# Patient Record
Sex: Female | Born: 1965
Health system: Southern US, Community
[De-identification: ages and names within clinical notes are randomized; demographics above are authoritative.]

## PROBLEM LIST (undated history)

## (undated) DIAGNOSIS — I10 Essential (primary) hypertension: Secondary | ICD-10-CM

## (undated) DIAGNOSIS — N189 Chronic kidney disease, unspecified: Secondary | ICD-10-CM

## (undated) DIAGNOSIS — M1990 Unspecified osteoarthritis, unspecified site: Secondary | ICD-10-CM

## (undated) DIAGNOSIS — M329 Systemic lupus erythematosus, unspecified: Secondary | ICD-10-CM

## (undated) DIAGNOSIS — M7989 Other specified soft tissue disorders: Secondary | ICD-10-CM

## (undated) DIAGNOSIS — IMO0002 Reserved for concepts with insufficient information to code with codable children: Secondary | ICD-10-CM

---

## 1999-10-10 ENCOUNTER — Emergency Department (HOSPITAL_COMMUNITY): Admission: EM | Admit: 1999-10-10 | Discharge: 1999-10-10 | Payer: Self-pay | Admitting: Emergency Medicine

## 2000-07-02 ENCOUNTER — Other Ambulatory Visit: Admission: RE | Admit: 2000-07-02 | Discharge: 2000-07-02 | Payer: Self-pay

## 2000-07-02 ENCOUNTER — Other Ambulatory Visit: Admission: RE | Admit: 2000-07-02 | Discharge: 2000-07-02 | Payer: Self-pay | Admitting: Obstetrics and Gynecology

## 2013-10-15 ENCOUNTER — Encounter: Payer: Self-pay | Admitting: Internal Medicine

## 2013-10-15 ENCOUNTER — Ambulatory Visit (INDEPENDENT_AMBULATORY_CARE_PROVIDER_SITE_OTHER): Payer: BC Managed Care – PPO | Admitting: Internal Medicine

## 2013-10-15 VITALS — BP 142/88 | HR 70 | Ht 65.5 in | Wt 254.1 lb

## 2013-10-15 DIAGNOSIS — R0789 Other chest pain: Secondary | ICD-10-CM

## 2013-10-15 DIAGNOSIS — R002 Palpitations: Secondary | ICD-10-CM

## 2013-10-15 DIAGNOSIS — R079 Chest pain, unspecified: Secondary | ICD-10-CM

## 2013-10-15 DIAGNOSIS — R0609 Other forms of dyspnea: Secondary | ICD-10-CM

## 2013-10-15 NOTE — Patient Instructions (Signed)
Dr. Rennis Golden has ordered an exercise tolerance test - a treadmill test.   Dr. Rennis Golden would like you to wear a heart monitor for 48 hours.  Your physician has recommended that you have a sleep study. This test records several body functions during sleep, including: brain activity, eye movement, oxygen and carbon dioxide blood levels, heart rate and rhythm, breathing rate and rhythm, the flow of air through your mouth and nose, snoring, body muscle movements, and chest and belly movement.  Your physician recommends that you schedule a follow-up appointment in a few weeks, after your tests.

## 2013-10-20 ENCOUNTER — Encounter: Payer: Self-pay | Admitting: Internal Medicine

## 2013-10-20 DIAGNOSIS — R0789 Other chest pain: Secondary | ICD-10-CM | POA: Insufficient documentation

## 2013-10-20 DIAGNOSIS — R002 Palpitations: Secondary | ICD-10-CM | POA: Insufficient documentation

## 2013-10-20 NOTE — Progress Notes (Signed)
    OFFICE NOTE  Chief Complaint:  Chest pain, palpitations  Primary Care Physician: Rodney Booze  HPI:  Denise Watson is a pleasant 55 show female with a history of morbid obesity, but no other significant medical problems. She's been describing irregular heart rate, palpitations and mild shortness of breath. She does been having some intermittent chest discomfort. At times it significant rated an 8/10 not necessarily associated with exertion relieved by rest. He's also had episodes of acid reflux and does have a history of anxiety. Her EKG was recently evaluated and is normal and she had a chest x-ray which shows no active disease. She was tried on Zantac without much improvement in her symptoms. She reports the palpitations come fairly frequently.  PMHx:  History reviewed. No pertinent past medical history.  History reviewed. No pertinent past surgical history.  FAMHx:  Family History  Problem Relation Age of Onset  . Kidney disease Father     SOCHx:   has no tobacco, alcohol, and drug history on file.  ALLERGIES:  No Known Allergies  ROS: A comprehensive review of systems was negative except for: Respiratory: positive for dyspnea on exertion Cardiovascular: positive for palpitations  HOME MEDS: Current Outpatient Prescriptions  Medication Sig Dispense Refill  . clotrimazole-betamethasone (LOTRISONE) cream Apply 1 application topically 2 (two) times daily.       No current facility-administered medications for this visit.    LABS/IMAGING: No results found for this or any previous visit (from the past 48 hour(s)). No results found.  VITALS: BP 142/88  Pulse 70  Ht 5' 5.5" (1.664 m)  Wt 254 lb 1.6 oz (115.259 kg)  BMI 41.63 kg/m2  EXAM: General appearance: alert and no distress Neck: no carotid bruit and no JVD Lungs: clear to auscultation bilaterally Heart: regular rate and rhythm, S1, S2 normal, no murmur, click, rub or gallop Abdomen: soft,  non-tender; bowel sounds normal; no masses,  no organomegaly and morbidly obese Extremities: extremities normal, atraumatic, no cyanosis or edema Pulses: 2+ and symmetric Skin: Skin color, texture, turgor normal. No rashes or lesions Neurologic: Grossly normal Psych: Mood, affect normal  EKG: Normal sinus rhythm at 70  ASSESSMENT: 1. Atypical chest pain-low risk for cardiac events 2. Palpitations 3. Morbid obesity 4. Snoring and concern for possible obstructive sleep apnea  PLAN: 1.   Ms. Mccrumb is having palpitations fairly regularly. I would recommend placement of a monitor to see if we can capture the cause of her palpitations. In addition she's had some shortness of breath with associated chest discomfort. I would recommend an exercise treadmill stress test as she has few risk factors to rule out ischemia. I suspect however her symptoms are very atypical for cardiac chest pain. She should continue her current medications and consider a trial of Nexium or a proton pump inhibitor for better reflux control. Finally, she does have snoring and a body habitus concerning for increased risk of sleep apnea. This could be a possible cause of some of her shortness of breath and her palpitations. I would like to refer her for a sleep study. I will see her back in a month to review the results of these tests.   Again a pleasure seeing Ms. Majka today. Thank you again for the kind referral.   Chrystie Nose, MD, [facility] Attending Cardiologist [facility]  Aislinn Feliz C 10/20/2013, 5:39 PM

## 2013-10-22 ENCOUNTER — Encounter: Payer: Self-pay | Admitting: Internal Medicine

## 2013-11-04 ENCOUNTER — Encounter (HOSPITAL_COMMUNITY): Payer: BC Managed Care – PPO

## 2013-11-12 ENCOUNTER — Ambulatory Visit: Payer: BC Managed Care – PPO | Admitting: Internal Medicine

## 2013-11-12 ENCOUNTER — Ambulatory Visit: Payer: BC Managed Care – PPO | Admitting: Cardiovascular Disease

## 2014-01-06 ENCOUNTER — Telehealth (HOSPITAL_COMMUNITY): Payer: Self-pay | Admitting: *Deleted

## 2014-01-12 ENCOUNTER — Telehealth (HOSPITAL_COMMUNITY): Payer: Self-pay | Admitting: *Deleted

## 2016-06-27 ENCOUNTER — Encounter (HOSPITAL_BASED_OUTPATIENT_CLINIC_OR_DEPARTMENT_OTHER): Payer: Self-pay

## 2016-06-27 ENCOUNTER — Emergency Department (HOSPITAL_BASED_OUTPATIENT_CLINIC_OR_DEPARTMENT_OTHER): Payer: BLUE CROSS/BLUE SHIELD

## 2016-06-27 ENCOUNTER — Emergency Department (HOSPITAL_BASED_OUTPATIENT_CLINIC_OR_DEPARTMENT_OTHER)
Admission: EM | Admit: 2016-06-27 | Discharge: 2016-06-28 | Disposition: A | Discharge status: Home / Self Care | Payer: BLUE CROSS/BLUE SHIELD | Attending: Emergency Medicine | Admitting: Emergency Medicine

## 2016-06-27 DIAGNOSIS — R0602 Shortness of breath: Secondary | ICD-10-CM

## 2016-06-27 DIAGNOSIS — R079 Chest pain, unspecified: Secondary | ICD-10-CM | POA: Diagnosis not present

## 2016-06-27 DIAGNOSIS — Z79899 Other long term (current) drug therapy: Secondary | ICD-10-CM | POA: Diagnosis not present

## 2016-06-27 DIAGNOSIS — R42 Dizziness and giddiness: Secondary | ICD-10-CM

## 2016-06-27 DIAGNOSIS — R110 Nausea: Secondary | ICD-10-CM | POA: Insufficient documentation

## 2016-06-27 HISTORY — DX: Other specified soft tissue disorders: M79.89

## 2016-06-27 LAB — HEPATIC FUNCTION PANEL
ALT: 23 U/L (ref 14–54)
AST: 26 U/L (ref 15–41)
Albumin: 4.1 g/dL (ref 3.5–5.0)
Alkaline Phosphatase: 103 U/L (ref 38–126)
BILIRUBIN DIRECT: 0.1 mg/dL (ref 0.1–0.5)
BILIRUBIN INDIRECT: 0.5 mg/dL (ref 0.3–0.9)
Total Bilirubin: 0.6 mg/dL (ref 0.3–1.2)
Total Protein: 8.7 g/dL — ABNORMAL HIGH (ref 6.5–8.1)

## 2016-06-27 LAB — BASIC METABOLIC PANEL
Anion gap: 10 (ref 5–15)
BUN: 17 mg/dL (ref 6–20)
CALCIUM: 9.2 mg/dL (ref 8.9–10.3)
CO2: 28 mmol/L (ref 22–32)
CREATININE: 1.08 mg/dL — AB (ref 0.44–1.00)
Chloride: 96 mmol/L — ABNORMAL LOW (ref 101–111)
GFR calc non Af Amer: 59 mL/min — ABNORMAL LOW (ref >60)
Glucose, Bld: 130 mg/dL — ABNORMAL HIGH (ref 65–99)
Potassium: 3.5 mmol/L (ref 3.5–5.1)
SODIUM: 134 mmol/L — AB (ref 135–145)

## 2016-06-27 LAB — CBC
HCT: 39.6 % (ref 36.0–46.0)
Hemoglobin: 12.9 g/dL (ref 12.0–15.0)
MCH: 29.4 pg (ref 26.0–34.0)
MCHC: 32.6 g/dL (ref 30.0–36.0)
MCV: 90.2 fL (ref 78.0–100.0)
PLATELETS: 234 10*3/uL (ref 150–400)
RBC: 4.39 MIL/uL (ref 3.87–5.11)
RDW: 13.2 % (ref 11.5–15.5)
WBC: 7.8 10*3/uL (ref 4.0–10.5)

## 2016-06-27 LAB — TROPONIN I

## 2016-06-27 MED ORDER — SODIUM CHLORIDE 0.9 % IV BOLUS (SEPSIS)
1000.0000 mL | Freq: Once | INTRAVENOUS | Status: AC
  Administered 2016-06-27: 1000 mL via INTRAVENOUS

## 2016-06-27 NOTE — ED Provider Notes (Signed)
[facility] Provider Note   CSN: [id number] Arrival date & time: 06/27/16  1818  First Provider Contact:  None    By signing my name below, I, Arianna Nassar, attest that this documentation has been prepared under the direction and in the presence of Yatziry Deakins, PA-C.  Electronically Signed: Octavia Heir, ED Scribe. 06/27/16. 7:05 PM.   History   Chief Complaint Chief Complaint  Patient presents with  . Chest Pain    The history is provided by the patient. No language interpreter was used.   HPI Comments: Denise Watson is a 50 y.o. female who has a PMhx of atypical chest pain, morbid obesity and lower extremity swelling presents to the Emergency Department complaining of intermittent, gradual worsening, moderate, central chest heaviness onset about one week ago. The chest pain is intermittent in nature without specific exacerbating factors. The chest pain is not exertional. She states that the chest pain radiates to her left arm. She reports associated nausea ~3 days ago, dizziness and shortness of breath. She says that it feels as if it is hard to catch her breath. Denies wheezing or cough. She notes that her nausea is worse in the morning and night. The nausea is not associated with the chest pain. She describes her dizziness as lightheadedness which is only present when she is ambulating. Pt notes she was seen in the ED last year for the same symptoms and had a stress test done with negative results. Pt expresses that her chest pain is similar to one year ago but she has been feeling worse than usual over the past few days. She denies fevers, chills, headache, URI symptoms, cough, rhinorrhea, nasal congestion, vomiting, back pain, lower extremity swelling or leg pain.  Chart review: patient presented to ED last year with similar symptoms. She was evaluated by cardiology with an echo stress test which was negative. She has followed up with her PCP for the chest pain but  did not keep scheduled cardiology follow up.  Past Medical History:  Diagnosis Date  . Swelling of both lower extremities     Patient Active Problem List   Diagnosis Date Noted  . Palpitations 10/20/2013  . Atypical chest pain 10/20/2013  . Morbid obesity (HCC) 10/20/2013    Past Surgical History:  Procedure Laterality Date  . CESAREAN SECTION      OB History    No data available       Home Medications    Prior to Admission medications   Medication Sig Start Date End Date Taking? Authorizing Provider  hydrochlorothiazide (HYDRODIURIL) 25 MG tablet Take 25 mg by mouth daily.   Yes Historical Provider, MD  clotrimazole-betamethasone (LOTRISONE) cream Apply 1 application topically 2 (two) times daily.    Historical Provider, MD    Family History Family History  Problem Relation Age of Onset  . Kidney disease Father     Social History Social History  Substance Use Topics  . Smoking status: Never Smoker  . Smokeless tobacco: Never Used  . Alcohol use Yes     Comment: occ     Allergies   Review of patient's allergies indicates no known allergies.   Review of Systems Review of Systems  Constitutional: Negative for fever.  HENT: Negative for rhinorrhea.   Respiratory: Positive for shortness of breath. Negative for cough and wheezing.   Cardiovascular: Positive for chest pain (central chest heaviness).  Gastrointestinal: Positive for nausea. Negative for vomiting.  Neurological: Positive for dizziness.  All other  systems reviewed and are negative.    Physical Exam Updated Vital Signs BP 111/78   Pulse 81   Temp 99 F (37.2 C) (Oral)   Resp 14   Ht 5\' 5"  (1.651 m)   Wt 132 kg   SpO2 97%   BMI 48.42 kg/m   Physical Exam  Constitutional: She is oriented to person, place, and time. She appears well-developed and well-nourished.  Nontoxic appearing. NAD.  HENT:  Head: Normocephalic.  Mouth/Throat: Oropharynx is clear and moist.  Eyes: EOM are  normal. Pupils are equal, round, and reactive to light.  Neck: Normal range of motion. Neck supple.  Cardiovascular: Normal rate, regular rhythm, normal heart sounds and intact distal pulses.   No murmur heard. No peripheral edema.  Pulmonary/Chest: Effort normal and breath sounds normal. No respiratory distress. She has no wheezes. She has no rales.  No increased work of breathing  Abdominal: Soft. She exhibits no distension. There is no tenderness.  Musculoskeletal: Normal range of motion.  Neurological: She is alert and oriented to person, place, and time.  Skin: Skin is warm and dry. Capillary refill takes less than 2 seconds.  Psychiatric: She has a normal mood and affect. Her behavior is normal.  Nursing note and vitals reviewed.    ED Treatments / Results  DIAGNOSTIC STUDIES: Oxygen Saturation is 98% on RA, normal by my interpretation.  COORDINATION OF CARE:  7:03 PM Discussed treatment plan which includes lab work with pt at bedside and pt agreed to plan.  Labs (all labs ordered are listed, but only abnormal results are displayed) Labs Reviewed  BASIC METABOLIC PANEL - Abnormal; Notable for the following:       Result Value   Sodium 134 (*)    Chloride 96 (*)    Glucose, Bld 130 (*)    Creatinine, Ser 1.08 (*)    GFR calc non Af Amer 59 (*)    All other components within normal limits  HEPATIC FUNCTION PANEL - Abnormal; Notable for the following:    Total Protein 8.7 (*)    All other components within normal limits  CBC  TROPONIN I  TROPONIN I    EKG  EKG Interpretation  Date/Time:  Wednesday June 27 2016 18:25:32 EDT Ventricular Rate:  104 PR Interval:  138 QRS Duration: 66 QT Interval:  358 QTC Calculation: 470 R Axis:   24 Text Interpretation:  Sinus tachycardia Cannot rule out Anterior infarct , age undetermined Abnormal ECG No old tracing to compare Confirmed by BELFI  MD, MELANIE ([id number]) on 06/27/2016 6:50:00 PM       Radiology Dg Chest 2  View  Result Date: 06/27/2016 CLINICAL DATA:  Chest pain, gradual worsening. EXAM: CHEST  2 VIEW COMPARISON:  None. FINDINGS: The heart size and mediastinal contours are within normal limits. Both lungs are clear. The visualized skeletal structures are unremarkable. IMPRESSION: No active cardiopulmonary disease. Electronically Signed   By: Elige Ko   On: 06/27/2016 19:41   Procedures Procedures (including critical care time)  Medications Ordered in ED Medications  sodium chloride 0.9 % bolus 1,000 mL (0 mLs Intravenous Stopped 06/27/16 2135)     Initial Impression / Assessment and Plan / ED Course  I have reviewed the triage vital signs and the nursing notes.  Pertinent labs & imaging results that were available during my care of the patient were reviewed by me and considered in my medical decision making (see chart for details).  Clinical Course   50  year old female presenting with intermittent chest pain x 1 week. Associated symptoms include SOB and dizziness upon standing. Afebrile and hemodynamically stable. Nontoxic appearing. NAD.  Heart RRR without murmur. Lungs CTAB. Troponin negative with non-iscehmic ECG. CXR without abnormality. Heart score of 2; low risk patient. BMET with slightly increased creatinine, hyponatremia and hypochloremia; pt mildly dehydrated. Given 1 L bolus. Chart reviewed and pt has had similar presentation in the past. Negative outpatient cardiology workup. Presentation and workup is not consistent with cardiac or pulmonary pain origin. Delta troponin negative. Discussed ED course with pt and recommended outpatient follow up with her PCP or cardiologist. Pt has been advised to return to the ED if CP becomes exertional, associated with diaphoresis or nausea, radiates to left jaw, worsens or becomes concerning in any way. Pt appears reliable for follow up and is agreeable to discharge.   Case has been discussed with  Dr. Fredderick Phenix who agrees with the above plan to  discharge.    Final Clinical Impressions(s) / ED Diagnoses   Final diagnoses:  Chest pain, unspecified chest pain type  Shortness of breath  Dizziness   I personally performed the services described in this documentation, which was scribed in my presence. The recorded information has been reviewed and is accurate.   New Prescriptions Discharge Medication List as of 06/28/2016  1:04 AM       Alveta Heimlich, PA-C 06/28/16 1145    Rolan Bucco, MD 06/28/16 1323

## 2016-06-27 NOTE — ED Triage Notes (Signed)
Intermittent CP x 3 weeks-NAD-steady gait

## 2016-06-28 NOTE — Discharge Instructions (Signed)
Follow up with your PCP in the next few days to recheck your symptoms. Follow up with your old cardiologist or  [facility]. Return to ED with new, worsening or concerning symptoms.

## 2016-06-28 NOTE — ED Notes (Signed)
PA-C at bedside 

## 2021-01-27 ENCOUNTER — Other Ambulatory Visit: Payer: Self-pay

## 2021-01-27 ENCOUNTER — Encounter (HOSPITAL_BASED_OUTPATIENT_CLINIC_OR_DEPARTMENT_OTHER): Payer: Self-pay

## 2021-01-27 ENCOUNTER — Emergency Department (HOSPITAL_BASED_OUTPATIENT_CLINIC_OR_DEPARTMENT_OTHER): Payer: BC Managed Care – PPO

## 2021-01-27 ENCOUNTER — Emergency Department (HOSPITAL_BASED_OUTPATIENT_CLINIC_OR_DEPARTMENT_OTHER)
Admission: EM | Admit: 2021-01-27 | Discharge: 2021-01-27 | Disposition: A | Discharge status: Home / Self Care | Payer: BC Managed Care – PPO | Attending: Emergency Medicine | Admitting: Emergency Medicine

## 2021-01-27 DIAGNOSIS — R059 Cough, unspecified: Secondary | ICD-10-CM | POA: Insufficient documentation

## 2021-01-27 DIAGNOSIS — H53129 Transient visual loss, unspecified eye: Secondary | ICD-10-CM | POA: Diagnosis not present

## 2021-01-27 DIAGNOSIS — R0981 Nasal congestion: Secondary | ICD-10-CM | POA: Diagnosis not present

## 2021-01-27 DIAGNOSIS — R079 Chest pain, unspecified: Secondary | ICD-10-CM | POA: Insufficient documentation

## 2021-01-27 DIAGNOSIS — Z20822 Contact with and (suspected) exposure to covid-19: Secondary | ICD-10-CM | POA: Diagnosis not present

## 2021-01-27 DIAGNOSIS — H539 Unspecified visual disturbance: Secondary | ICD-10-CM

## 2021-01-27 DIAGNOSIS — R519 Headache, unspecified: Secondary | ICD-10-CM | POA: Diagnosis not present

## 2021-01-27 LAB — COMPREHENSIVE METABOLIC PANEL
ALT: 25 U/L (ref 0–44)
AST: 33 U/L (ref 15–41)
Albumin: 3.6 g/dL (ref 3.5–5.0)
Alkaline Phosphatase: 69 U/L (ref 38–126)
Anion gap: 9 (ref 5–15)
BUN: 13 mg/dL (ref 6–20)
CO2: 27 mmol/L (ref 22–32)
Calcium: 8.6 mg/dL — ABNORMAL LOW (ref 8.9–10.3)
Chloride: 103 mmol/L (ref 98–111)
Creatinine, Ser: 0.97 mg/dL (ref 0.44–1.00)
GFR, Estimated: >60 mL/min (ref >60)
Glucose, Bld: 93 mg/dL (ref 70–99)
Potassium: 4 mmol/L (ref 3.5–5.1)
Sodium: 139 mmol/L (ref 135–145)
Total Bilirubin: 0.3 mg/dL (ref 0.3–1.2)
Total Protein: 7.7 g/dL (ref 6.5–8.1)

## 2021-01-27 LAB — CBC WITH DIFFERENTIAL/PLATELET
Abs Immature Granulocytes: 0.02 10*3/uL (ref 0.00–0.07)
Basophils Absolute: 0 10*3/uL (ref 0.0–0.1)
Basophils Relative: 0 %
Eosinophils Absolute: 0 10*3/uL (ref 0.0–0.5)
Eosinophils Relative: 1 %
HCT: 37.3 % (ref 36.0–46.0)
Hemoglobin: 12 g/dL (ref 12.0–15.0)
Immature Granulocytes: 1 %
Lymphocytes Relative: 18 %
Lymphs Abs: 0.7 10*3/uL (ref 0.7–4.0)
MCH: 30.4 pg (ref 26.0–34.0)
MCHC: 32.2 g/dL (ref 30.0–36.0)
MCV: 94.4 fL (ref 80.0–100.0)
Monocytes Absolute: 0.2 10*3/uL (ref 0.1–1.0)
Monocytes Relative: 5 %
Neutro Abs: 2.9 10*3/uL (ref 1.7–7.7)
Neutrophils Relative %: 75 %
Platelets: 195 10*3/uL (ref 150–400)
RBC: 3.95 MIL/uL (ref 3.87–5.11)
RDW: 13.2 % (ref 11.5–15.5)
WBC Morphology: ABNORMAL
WBC: 3.9 10*3/uL — ABNORMAL LOW (ref 4.0–10.5)
nRBC: 0 % (ref 0.0–0.2)

## 2021-01-27 LAB — SARS CORONAVIRUS 2 BY RT PCR (HOSPITAL ORDER, PERFORMED IN ~~LOC~~ HOSPITAL LAB): SARS Coronavirus 2: NEGATIVE

## 2021-01-27 MED ORDER — SODIUM CHLORIDE 0.9 % IV BOLUS
1000.0000 mL | Freq: Once | INTRAVENOUS | Status: AC
Start: 2021-01-27 — End: 2021-01-27
  Administered 2021-01-27: 1000 mL via INTRAVENOUS

## 2021-01-27 MED ORDER — PROCHLORPERAZINE EDISYLATE 10 MG/2ML IJ SOLN
10.0000 mg | Freq: Once | INTRAMUSCULAR | Status: AC
Start: 2021-01-27 — End: 2021-01-27
  Administered 2021-01-27: 10 mg via INTRAVENOUS
  Filled 2021-01-27: qty 2

## 2021-01-27 MED ORDER — DIPHENHYDRAMINE HCL 50 MG/ML IJ SOLN
25.0000 mg | Freq: Once | INTRAMUSCULAR | Status: AC
  Administered 2021-01-27: 25 mg via INTRAVENOUS
  Filled 2021-01-27: qty 1

## 2021-01-27 MED ORDER — IOHEXOL 350 MG/ML SOLN
100.0000 mL | Freq: Once | INTRAVENOUS | Status: AC | PRN
  Administered 2021-01-27: 100 mL via INTRAVENOUS

## 2021-01-27 NOTE — Discharge Instructions (Signed)
Your work-up today did not show evidence of the dural venous sinus thrombosis as we were initially concerned about.  Your Covid test and other labs are reassuring.  No pneumonia seen on x-ray.  I suspect you have a complicated migraine causing the vision changes with your headache with the light sensitivity and sound sensitivity.  Initially, the neurology team recommended MRI with and without contrast of your brain and orbits if your symptoms persisted after the CT scan but as your symptoms have resolved, we feel it is reasonable for you to go home and instead follow-up with outpatient PCP and neurology.  If any symptoms change or worsen, I would return immediately to the nearest emergency department where they may discuss ordering those MRIs which we did not collect today.  Please rest and stay hydrated.  If any symptoms change or worsen, please return immediately.

## 2021-01-27 NOTE — ED Triage Notes (Signed)
Pt arrives with c/o having cold symptoms since January reports she has had pain in her ears, pain in her chest when coughing, and a lot of congestion. Pt states that this morning when she woke up she noticed she had double vision

## 2021-01-27 NOTE — ED Notes (Signed)
States feeling some better after meds rec'd per ED orders.

## 2021-01-27 NOTE — ED Provider Notes (Signed)
[facility] EMERGENCY DEPARTMENT Provider Note   CSN: [id number] Arrival date & time: 01/27/21  1017     History Chief Complaint  Patient presents with  . Eye Problem  . Nasal Congestion    Denise Watson is a 55 y.o. female.  The history is provided by the patient and medical records. No language interpreter was used.  Neurologic Problem This is a new problem. The current episode started 6 to 12 hours ago. The problem occurs constantly. The problem has been resolved. Associated symptoms include chest pain (at times with coughing) and headaches. Pertinent negatives include no abdominal pain and no shortness of breath. Nothing aggravates the symptoms. Nothing relieves the symptoms. She has tried nothing for the symptoms. The treatment provided no relief.       Past Medical History:  Diagnosis Date  . Swelling of both lower extremities     Patient Active Problem List   Diagnosis Date Noted  . Palpitations 10/20/2013  . Atypical chest pain 10/20/2013  . Morbid obesity (HCC) 10/20/2013    Past Surgical History:  Procedure Laterality Date  . CESAREAN SECTION       OB History   No obstetric history on file.     Family History  Problem Relation Age of Onset  . Kidney disease Father     Social History   Tobacco Use  . Smoking status: Never Smoker  . Smokeless tobacco: Never Used  Substance Use Topics  . Alcohol use: Yes    Comment: occ  . Drug use: No    Home Medications Prior to Admission medications   Medication Sig Start Date End Date Taking? Authorizing Provider  clotrimazole-betamethasone (LOTRISONE) cream Apply 1 application topically 2 (two) times daily.    [provider]  hydrochlorothiazide (HYDRODIURIL) 25 MG tablet Take 25 mg by mouth daily.    [provider]    Allergies    Patient has no known allergies.  Review of Systems   Review of Systems  Constitutional: Positive for fatigue. Negative for chills,  diaphoresis and fever.  HENT: Positive for congestion, ear pain, rhinorrhea and sinus pressure. Negative for trouble swallowing.   Eyes: Positive for photophobia and visual disturbance. Negative for pain.  Respiratory: Positive for cough. Negative for chest tightness, shortness of breath and wheezing.   Cardiovascular: Positive for chest pain (at times with coughing). Negative for palpitations and leg swelling.  Gastrointestinal: Negative for abdominal pain, constipation, diarrhea, nausea and vomiting.  Genitourinary: Negative for dysuria, flank pain and frequency.  Musculoskeletal: Negative for neck pain and neck stiffness.  Skin: Negative for rash and wound.  Neurological: Positive for headaches. Negative for dizziness, facial asymmetry and light-headedness.  Psychiatric/Behavioral: Negative for agitation and confusion.  All other systems reviewed and are negative.   Physical Exam Updated Vital Signs BP (!) 159/86 (BP Location: Right Arm)   Pulse 69   Temp 98.5 F (36.9 C) (Oral)   Resp 14   Ht 5\' 5"  (1.651 m)   Wt 108.9 kg   SpO2 96%   BMI 39.94 kg/m   Physical Exam Vitals and nursing note reviewed.  Constitutional:      General: She is not in acute distress.    Appearance: She is well-developed and well-nourished. She is not ill-appearing, toxic-appearing or diaphoretic.  HENT:     Head: Normocephalic and atraumatic.     Nose: Congestion present.     Mouth/Throat:     Mouth: Mucous membranes are moist.  Pharynx: No oropharyngeal exudate or posterior oropharyngeal erythema.  Eyes:     Extraocular Movements: Extraocular movements intact.     Conjunctiva/sclera: Conjunctivae normal.     Pupils: Pupils are equal, round, and reactive to light.  Cardiovascular:     Rate and Rhythm: Normal rate and regular rhythm.     Pulses: Normal pulses.     Heart sounds: No murmur heard.   Pulmonary:     Effort: Pulmonary effort is normal. No respiratory distress.     Breath  sounds: Normal breath sounds. No wheezing, rhonchi or rales.  Chest:     Chest wall: No tenderness.  Abdominal:     General: Abdomen is flat.     Palpations: Abdomen is soft.     Tenderness: There is no abdominal tenderness. There is no right CVA tenderness, left CVA tenderness, guarding or rebound.  Musculoskeletal:        General: No tenderness or edema.     Cervical back: Neck supple. No rigidity or tenderness.     Right lower leg: No edema.     Left lower leg: No edema.  Lymphadenopathy:     Cervical: No cervical adenopathy.  Skin:    General: Skin is warm and dry.     Capillary Refill: Capillary refill takes less than 2 seconds.     Findings: No erythema or rash.  Neurological:     General: No focal deficit present.     Mental Status: She is alert and oriented to person, place, and time.     Cranial Nerves: No cranial nerve deficit.     Sensory: No sensory deficit.     Motor: No weakness.     Coordination: Coordination normal.  Psychiatric:        Mood and Affect: Mood and affect and mood normal.     ED Results / Procedures / Treatments   Labs (all labs ordered are listed, but only abnormal results are displayed) Labs Reviewed  CBC WITH DIFFERENTIAL/PLATELET - Abnormal; Notable for the following components:      Result Value   WBC 3.9 (*)    All other components within normal limits  COMPREHENSIVE METABOLIC PANEL - Abnormal; Notable for the following components:   Calcium 8.6 (*)    All other components within normal limits  SARS CORONAVIRUS 2 (HOSPITAL ORDER, PERFORMED IN [facility] LAB)  PREGNANCY, URINE    EKG EKG Interpretation  Date/Time:  Friday January 27 2021 10:25:02 EST Ventricular Rate:  67 PR Interval:  150 QRS Duration: 64 QT Interval:  404 QTC Calculation: 426 R Axis:   -5 Text Interpretation: Normal sinus rhythm Cannot rule out Anterior infarct , age undetermined Abnormal ECG When compared to prior, similar appearance but slower  rate. No STEMI Confirmed by Theda Belfast ([id number]) on 01/27/2021 10:41:51 AM   Radiology CT Angio Head W or Wo Contrast  Result Date: 01/27/2021 CLINICAL DATA:  Diplopia.  Dural sinus thrombosis suspected. EXAM: CT ANGIOGRAPHY HEAD CT VENOGRAM HEAD TECHNIQUE: Multidetector CT imaging of the head was performed using the standard protocol during bolus administration of intravenous contrast. Multiplanar CT image reconstructions and MIPs were obtained to evaluate the arterial anatomy. Additional delayed multidetector CT imaging of the head was performed with multiplanar CT image reconstructions and MIPS to evaluate the venous anatomy. CONTRAST:  OMNIPAQUE IOHEXOL 350 MG/ML SOLN COMPARISON:  None. FINDINGS: CT HEAD Brain: No evidence of acute large vascular territory infarction, acute hemorrhage, hydrocephalus, extra-axial collection or mass lesion/mass  effect. Mild narrowing of the mamillopontine distance and effacement of the suprasellar cistern with trace tonsillar ectopia. Skull: No acute fracture. Sinuses: Visualized sinuses are clear. Orbits: No acute finding. CTA HEAD Due to venous timing, significantly limited evaluation. The intradural left vertebral artery is poorly opacified, which could relate to stenosis or non dominant vertebral artery. Otherwise, no evidence of proximal large vessel occlusion or large aneurysm in the anterior or posterior circulation. CT VENOGRAM No evidence of dural sinus thrombosis. Small left transverse sinus and narrowing of the distal right transverse sinus. Small rounded filling defect in the distal right transverse sinus likely represents arachnoid granulation. IMPRESSION: CT head: 1. No definite evidence of acute intracranial abnormality. 2. Mild narrowing of the mamillopontine distance and effacement of the suprasellar cistern with trace tonsillar ectopia. Given the patient is young and has relative paucity of CSF spaces/sulci diffusely these findings may be within  normal limits, but recommend correlation with signs/symptoms of intracranial hypotension including positional headaches. Additionally, per Dr. Rush Landmark the patient is going to get a follow-up MRI which can further evaluate these findings. CTA head: Due to venous timing, significantly limited evaluation. The intradural left vertebral artery is not well opacified, which is poorly evaluated and could relate to nondominant vertebral artery or stenosis. A CTA neck could further evaluate if clinically indicated. Otherwise, no evidence of proximal large vessel occlusion or large aneurysm. A CTA of the neck could further evaluate the vertebral finding and a repeat CTA head could provide more sensitive intracranial arterial evaluation if clinically indicated. CTV: No evidence of dural sinus thrombosis. Findings discussed with Dr. Julieanne Manson at 3:05 PM via telephone. Electronically Signed   By: Feliberto Harts MD   On: 01/27/2021 15:15   DG Chest Portable 1 View  Result Date: 01/27/2021 CLINICAL DATA:  Persistent cough. Recurrent upper respiratory infection EXAM: PORTABLE CHEST 1 VIEW COMPARISON:  Radiograph 06/27/2016 FINDINGS: 1355 hours. The heart size and mediastinal contours are normal. The lungs are clear. There is no pleural effusion or pneumothorax. No acute osseous findings are identified. Telemetry leads overlie the chest. IMPRESSION: No active cardiopulmonary process. Electronically Signed   By: Carey Bullocks M.D.   On: 01/27/2021 14:47   CT VENOGRAM HEAD  Result Date: 01/27/2021 CLINICAL DATA:  Diplopia.  Dural sinus thrombosis suspected. EXAM: CT ANGIOGRAPHY HEAD CT VENOGRAM HEAD TECHNIQUE: Multidetector CT imaging of the head was performed using the standard protocol during bolus administration of intravenous contrast. Multiplanar CT image reconstructions and MIPs were obtained to evaluate the arterial anatomy. Additional delayed multidetector CT imaging of the head was performed with multiplanar CT image  reconstructions and MIPS to evaluate the venous anatomy. CONTRAST:  OMNIPAQUE IOHEXOL 350 MG/ML SOLN COMPARISON:  None. FINDINGS: CT HEAD Brain: No evidence of acute large vascular territory infarction, acute hemorrhage, hydrocephalus, extra-axial collection or mass lesion/mass effect. Mild narrowing of the mamillopontine distance and effacement of the suprasellar cistern with trace tonsillar ectopia. Skull: No acute fracture. Sinuses: Visualized sinuses are clear. Orbits: No acute finding. CTA HEAD Due to venous timing, significantly limited evaluation. The intradural left vertebral artery is poorly opacified, which could relate to stenosis or non dominant vertebral artery. Otherwise, no evidence of proximal large vessel occlusion or large aneurysm in the anterior or posterior circulation. CT VENOGRAM No evidence of dural sinus thrombosis. Small left transverse sinus and narrowing of the distal right transverse sinus. Small rounded filling defect in the distal right transverse sinus likely represents arachnoid granulation. IMPRESSION: CT head: 1. No  definite evidence of acute intracranial abnormality. 2. Mild narrowing of the mamillopontine distance and effacement of the suprasellar cistern with trace tonsillar ectopia. Given the patient is young and has relative paucity of CSF spaces/sulci diffusely these findings may be within normal limits, but recommend correlation with signs/symptoms of intracranial hypotension including positional headaches. Additionally, per Dr. Rush Landmark the patient is going to get a follow-up MRI which can further evaluate these findings. CTA head: Due to venous timing, significantly limited evaluation. The intradural left vertebral artery is not well opacified, which is poorly evaluated and could relate to nondominant vertebral artery or stenosis. A CTA neck could further evaluate if clinically indicated. Otherwise, no evidence of proximal large vessel occlusion or large aneurysm. A  CTA of the neck could further evaluate the vertebral finding and a repeat CTA head could provide more sensitive intracranial arterial evaluation if clinically indicated. CTV: No evidence of dural sinus thrombosis. Findings discussed with Dr. Julieanne Manson at 3:05 PM via telephone. Electronically Signed   By: Feliberto Harts MD   On: 01/27/2021 15:15    Procedures Procedures   Medications Ordered in ED Medications  sodium chloride 0.9 % bolus 1,000 mL (0 mLs Intravenous Stopped 01/27/21 1504)  prochlorperazine (COMPAZINE) injection 10 mg (10 mg Intravenous Given 01/27/21 1231)  diphenhydrAMINE (BENADRYL) injection 25 mg (25 mg Intravenous Given 01/27/21 1232)  iohexol (OMNIPAQUE) 350 MG/ML injection 100 mL (100 mLs Intravenous Contrast Given 01/27/21 1346)    ED Course  I have reviewed the triage vital signs and the nursing notes.  Pertinent labs & imaging results that were available during my care of the patient were reviewed by me and considered in my medical decision making (see chart for details).    MDM Rules/Calculators/A&P                          Chanese Hartsough is a 55 y.o. female with a past medical history significant for prior palpitations who presents with URI symptoms for 1 month including congestion, intermittent headaches, ear pains, and cough who presents with new diplopia and worsened headache.  Patient reports that for around 1 month, she has had URI symptoms with congestion, cough, sinus pressure, and some ear soreness.  She reports that she never got tested for Covid..  She reports she has been vaccinated but never got boosted as she was feeling bad this month.  She reports that her cough has been nonproductive and there is no hemoptysis.  She reports that she has chest tightness and discomfort when she is coughing but otherwise is not having baseline discomfort.  She denies constant shortness of breath.  Denies new leg pain or leg swelling.  She says that this morning, she woke up  and was having double vision laterally.  She says she is never had this before.  She reports it has been waxing and waning and resolved just prior to arrival to the emergency department.  She reports she is having photosensitivity and photophobia with her headache that is primarily in her right frontal forehead.  She denies tenderness on the right face and temporal area.  She denies any trauma or injuries.  Denies neck pain or neck stiffness.  Denies current fevers or chills.  On exam, lungs are clear and chest is nontender.  Abdomen is nontender.  Back is nontender.  Neck is nontender.  No stridor.  Overall, no focal neurologic deficits with normal sensation and strength in extremities.  Normal finger-nose-finger  testing.  Pupils are symmetric and reactive with normal extraocular movements.  No tenderness in the right temporal area.  Normal facial sensation and no facial droop seen.  Patient reports her double vision is improved but she still having headaches and concerned about her head and sinuses.  Clinically I am somewhat concerned the patient may have had Covid over the last month and with his new and worsened prolonged headache with now diplopia, feel we need imaging to rule out dural venous sinus thrombosis.  Given her lack of any neck pain or neck stiffness, lower suspicion for meningitis.  She is also not having any fevers or chills.  We will get chest x-ray given the persistent cough and tested for Covid.  We will get a CTV.  With her diplopia that is coming and going, anticipate touching base with neurology to discuss if she needs further imaging such as MRI.  Will give headache cocktail while we await work-up results.  Anticipate reassessment.  12:59 PM Just spoke to neurology and he agrees with getting imaging of the head.  Aside from the CTV, who recommended CTA of the head.  He also recommended that is reassuring, she will need transfer for MRI with and without of the head and orbits.  He  agreed with making sure there was no tumor, MS dural venous sinus thrombosis, or stroke.  Anticipate discussion for ED to ED transfer after imaging is completed here.  Covid test was negative.  Chest x-ray negative.  Labs overall reassuring  CT scan showed no evidence of venous sinus thrombosis and the CTA component did not show a large aneurysm or stroke or bleed.  There was less than ideal timing for the CTA component of it.  There was also possible narrowing of some distances and effacement which could be either normal for her versus intracranial hypertension however patient reports her headaches are nonpositional, this feels less likely.  Initially, plan was to get patient transferred for MRI with and without contrast of the head and orbits however patient now reports her headache is completely resolved.  She has no vision changes and would like to go home.  She does not want to be transferred ED to ED for MRI today.  She understands following with her PCP and that we could be missing things like TIA, stroke, mass, MS, or other abnormalities.  I still did not think he has meningitis and given her resolution of symptoms, we feel she is reasonable to go home.  Patient was given instructions to follow-up with neurology and PCP as well as return precautions.  She had no questions or concerns and was discharged in good condition with resolution of symptoms.  Final Clinical Impression(s) / ED Diagnoses Final diagnoses:  Acute nonintractable headache, unspecified headache type  Transient vision disturbance    Rx / DC Orders ED Discharge Orders    None     Clinical Impression: 1. Acute nonintractable headache, unspecified headache type   2. Transient vision disturbance     Disposition: Discharge  Condition: Good  I have discussed the results, Dx and Tx plan with the pt(& family if present). He/she/they expressed understanding and agree(s) with the plan. Discharge instructions discussed at  great length. Strict return precautions discussed and pt &/or family have verbalized understanding of the instructions. No further questions at time of discharge.    Discharge Medication List as of 01/27/2021  3:47 PM      Follow Up: Burnis MedinFulbright, [place] E, [place]-C [id number] [facility]  [address] [place] [address] [phone]     [facility] [address] [place], [address] [place] [address] [phone]    [facility] [address]     [address] [place] [address] [phone]       Treysean Petruzzi, Canary Brim, MD 01/27/21 [phone]

## 2021-01-27 NOTE — ED Notes (Signed)
AVS reviewed with pt, reinforced ED MD recommendation to make follow up appt with [facility] Neurology MD. Also discussed when time would arise for client to return to the ED. Opportunity for questions provided as well. Family here with pt.

## 2021-02-16 ENCOUNTER — Encounter (HOSPITAL_COMMUNITY): Payer: Self-pay

## 2021-02-16 ENCOUNTER — Emergency Department (HOSPITAL_COMMUNITY): Payer: BC Managed Care – PPO

## 2021-02-16 ENCOUNTER — Inpatient Hospital Stay (HOSPITAL_COMMUNITY)
Admission: EM | Admit: 2021-02-16 | Discharge: 2021-03-01 | DRG: 545 | Disposition: A | Discharge status: Home Health | Payer: BC Managed Care – PPO | Attending: Internal Medicine | Admitting: Internal Medicine

## 2021-02-16 ENCOUNTER — Inpatient Hospital Stay (HOSPITAL_COMMUNITY): Payer: BC Managed Care – PPO

## 2021-02-16 ENCOUNTER — Other Ambulatory Visit: Payer: Self-pay

## 2021-02-16 DIAGNOSIS — N049 Nephrotic syndrome with unspecified morphologic changes: Secondary | ICD-10-CM | POA: Diagnosis present

## 2021-02-16 DIAGNOSIS — Z841 Family history of disorders of kidney and ureter: Secondary | ICD-10-CM | POA: Diagnosis not present

## 2021-02-16 DIAGNOSIS — E669 Obesity, unspecified: Secondary | ICD-10-CM | POA: Diagnosis present

## 2021-02-16 DIAGNOSIS — Z23 Encounter for immunization: Secondary | ICD-10-CM | POA: Diagnosis not present

## 2021-02-16 DIAGNOSIS — I110 Hypertensive heart disease with heart failure: Secondary | ICD-10-CM | POA: Diagnosis present

## 2021-02-16 DIAGNOSIS — K824 Cholesterolosis of gallbladder: Secondary | ICD-10-CM | POA: Diagnosis present

## 2021-02-16 DIAGNOSIS — Z20822 Contact with and (suspected) exposure to covid-19: Secondary | ICD-10-CM | POA: Diagnosis present

## 2021-02-16 DIAGNOSIS — J029 Acute pharyngitis, unspecified: Secondary | ICD-10-CM | POA: Diagnosis present

## 2021-02-16 DIAGNOSIS — Z79899 Other long term (current) drug therapy: Secondary | ICD-10-CM | POA: Diagnosis not present

## 2021-02-16 DIAGNOSIS — K859 Acute pancreatitis without necrosis or infection, unspecified: Secondary | ICD-10-CM | POA: Diagnosis present

## 2021-02-16 DIAGNOSIS — N179 Acute kidney failure, unspecified: Secondary | ICD-10-CM | POA: Diagnosis present

## 2021-02-16 DIAGNOSIS — I10 Essential (primary) hypertension: Secondary | ICD-10-CM | POA: Diagnosis not present

## 2021-02-16 DIAGNOSIS — M3214 Glomerular disease in systemic lupus erythematosus: Secondary | ICD-10-CM | POA: Diagnosis present

## 2021-02-16 DIAGNOSIS — L409 Psoriasis, unspecified: Secondary | ICD-10-CM | POA: Diagnosis present

## 2021-02-16 DIAGNOSIS — Z8616 Personal history of COVID-19: Secondary | ICD-10-CM | POA: Diagnosis not present

## 2021-02-16 DIAGNOSIS — R7401 Elevation of levels of liver transaminase levels: Secondary | ICD-10-CM | POA: Diagnosis not present

## 2021-02-16 DIAGNOSIS — M329 Systemic lupus erythematosus, unspecified: Secondary | ICD-10-CM | POA: Diagnosis not present

## 2021-02-16 DIAGNOSIS — D696 Thrombocytopenia, unspecified: Secondary | ICD-10-CM | POA: Diagnosis present

## 2021-02-16 DIAGNOSIS — I5031 Acute diastolic (congestive) heart failure: Secondary | ICD-10-CM | POA: Diagnosis present

## 2021-02-16 DIAGNOSIS — R34 Anuria and oliguria: Secondary | ICD-10-CM | POA: Diagnosis present

## 2021-02-16 DIAGNOSIS — E871 Hypo-osmolality and hyponatremia: Secondary | ICD-10-CM | POA: Diagnosis not present

## 2021-02-16 DIAGNOSIS — R109 Unspecified abdominal pain: Secondary | ICD-10-CM

## 2021-02-16 DIAGNOSIS — Z6841 Body Mass Index (BMI) 40.0 and over, adult: Secondary | ICD-10-CM

## 2021-02-16 DIAGNOSIS — E875 Hyperkalemia: Secondary | ICD-10-CM | POA: Diagnosis present

## 2021-02-16 DIAGNOSIS — K5900 Constipation, unspecified: Secondary | ICD-10-CM | POA: Diagnosis present

## 2021-02-16 DIAGNOSIS — R10A0 Flank pain, unspecified side: Secondary | ICD-10-CM

## 2021-02-16 DIAGNOSIS — R1084 Generalized abdominal pain: Secondary | ICD-10-CM | POA: Diagnosis not present

## 2021-02-16 DIAGNOSIS — K858 Other acute pancreatitis without necrosis or infection: Secondary | ICD-10-CM | POA: Diagnosis not present

## 2021-02-16 LAB — BASIC METABOLIC PANEL
Anion gap: 14 (ref 5–15)
BUN: 103 mg/dL — ABNORMAL HIGH (ref 6–20)
CO2: 20 mmol/L — ABNORMAL LOW (ref 22–32)
Calcium: 7.7 mg/dL — ABNORMAL LOW (ref 8.9–10.3)
Chloride: 98 mmol/L (ref 98–111)
Creatinine, Ser: 10.18 mg/dL — ABNORMAL HIGH (ref 0.44–1.00)
GFR, Estimated: 4 mL/min — ABNORMAL LOW (ref >60)
Glucose, Bld: 102 mg/dL — ABNORMAL HIGH (ref 70–99)
Potassium: 5.1 mmol/L (ref 3.5–5.1)
Sodium: 132 mmol/L — ABNORMAL LOW (ref 135–145)

## 2021-02-16 LAB — PROTEIN / CREATININE RATIO, URINE
Creatinine, Urine: 499.79 mg/dL
Total Protein, Urine: >3000 mg/dL

## 2021-02-16 LAB — RESPIRATORY PANEL BY PCR

## 2021-02-16 LAB — URINALYSIS, ROUTINE W REFLEX MICROSCOPIC
Bilirubin Urine: NEGATIVE
Glucose, UA: NEGATIVE mg/dL
Hgb urine dipstick: NEGATIVE
Ketones, ur: NEGATIVE mg/dL
Leukocytes,Ua: NEGATIVE
Nitrite: NEGATIVE
Protein, ur: >=300 mg/dL — AB
Specific Gravity, Urine: >1.046 — ABNORMAL HIGH (ref 1.005–1.030)
pH: 5 (ref 5.0–8.0)

## 2021-02-16 LAB — HIV ANTIBODY (ROUTINE TESTING W REFLEX): HIV Screen 4th Generation wRfx: NONREACTIVE

## 2021-02-16 LAB — CBC
HCT: 38.2 % (ref 36.0–46.0)
HCT: 38.6 % (ref 36.0–46.0)
Hemoglobin: 12.1 g/dL (ref 12.0–15.0)
Hemoglobin: 12.7 g/dL (ref 12.0–15.0)
MCH: 28.8 pg (ref 26.0–34.0)
MCH: 29.8 pg (ref 26.0–34.0)
MCHC: 31.7 g/dL (ref 30.0–36.0)
MCHC: 32.9 g/dL (ref 30.0–36.0)
MCV: 91 fL (ref 80.0–100.0)
MCV: 90.6 fL (ref 80.0–100.0)
Platelets: 231 10*3/uL (ref 150–400)
Platelets: 229 10*3/uL (ref 150–400)
RBC: 4.2 MIL/uL (ref 3.87–5.11)
RBC: 4.26 MIL/uL (ref 3.87–5.11)
RDW: 13.6 % (ref 11.5–15.5)
RDW: 13.6 % (ref 11.5–15.5)
WBC: 3.5 10*3/uL — ABNORMAL LOW (ref 4.0–10.5)
WBC: 3.3 10*3/uL — ABNORMAL LOW (ref 4.0–10.5)
nRBC: 0 % (ref 0.0–0.2)
nRBC: 0 % (ref 0.0–0.2)

## 2021-02-16 LAB — HEPATIC FUNCTION PANEL
ALT: 62 U/L — ABNORMAL HIGH (ref 0–44)
AST: 123 U/L — ABNORMAL HIGH (ref 15–41)
Albumin: 1.5 g/dL — ABNORMAL LOW (ref 3.5–5.0)
Alkaline Phosphatase: 69 U/L (ref 38–126)
Bilirubin, Direct: 0.1 mg/dL (ref 0.0–0.2)
Indirect Bilirubin: 1.2 mg/dL — ABNORMAL HIGH (ref 0.3–0.9)
Total Bilirubin: 1.3 mg/dL — ABNORMAL HIGH (ref 0.3–1.2)
Total Protein: 5.1 g/dL — ABNORMAL LOW (ref 6.5–8.1)

## 2021-02-16 LAB — HEMOGLOBIN A1C
Hgb A1c MFr Bld: 6.3 % — ABNORMAL HIGH (ref 4.8–5.6)
Mean Plasma Glucose: 134.11 mg/dL

## 2021-02-16 LAB — CREATININE, URINE, RANDOM: Creatinine, Urine: 534.73 mg/dL

## 2021-02-16 LAB — SODIUM, URINE, RANDOM: Sodium, Ur: 22 mmol/L

## 2021-02-16 LAB — CREATININE, SERUM
Creatinine, Ser: 10.45 mg/dL — ABNORMAL HIGH (ref 0.44–1.00)
GFR, Estimated: 4 mL/min — ABNORMAL LOW (ref >60)

## 2021-02-16 LAB — HEPATITIS PANEL, ACUTE
HCV Ab: NONREACTIVE
Hep A IgM: NONREACTIVE
Hep B C IgM: NONREACTIVE
Hepatitis B Surface Ag: NONREACTIVE

## 2021-02-16 LAB — TSH: TSH: 1.836 u[IU]/mL (ref 0.350–4.500)

## 2021-02-16 LAB — SARS CORONAVIRUS 2 (TAT 6-24 HRS): SARS Coronavirus 2: NEGATIVE

## 2021-02-16 LAB — TROPONIN I (HIGH SENSITIVITY)
Troponin I (High Sensitivity): 23 ng/L — ABNORMAL HIGH (ref <18)
Troponin I (High Sensitivity): 20 ng/L — ABNORMAL HIGH (ref <18)

## 2021-02-16 MED ORDER — SODIUM CHLORIDE 0.9 % IV SOLN: INTRAVENOUS | Status: DC

## 2021-02-16 MED ORDER — SODIUM POLYSTYRENE SULFONATE 15 GM/60ML PO SUSP
30.0000 g | Freq: Once | ORAL | Status: AC
  Administered 2021-02-16: 30 g via ORAL
  Filled 2021-02-16: qty 120

## 2021-02-16 MED ORDER — ACETAMINOPHEN 650 MG RE SUPP: 650.0000 mg | Freq: Four times a day (QID) | RECTAL | Status: DC | PRN

## 2021-02-16 MED ORDER — ONDANSETRON HCL 4 MG/2ML IJ SOLN
4.0000 mg | Freq: Four times a day (QID) | INTRAMUSCULAR | Status: DC | PRN
  Administered 2021-02-17 – 2021-02-21 (×4): 4 mg via INTRAVENOUS
  Filled 2021-02-16 (×7): qty 2

## 2021-02-16 MED ORDER — HEPARIN SODIUM (PORCINE) 5000 UNIT/ML IJ SOLN
5000.0000 [IU] | Freq: Three times a day (TID) | INTRAMUSCULAR | Status: AC
  Administered 2021-02-16 – 2021-02-19 (×6): 5000 [IU] via SUBCUTANEOUS
  Filled 2021-02-16 (×6): qty 1

## 2021-02-16 MED ORDER — HYDRALAZINE HCL 20 MG/ML IJ SOLN: 10.0000 mg | Freq: Four times a day (QID) | INTRAMUSCULAR | Status: DC | PRN

## 2021-02-16 MED ORDER — LACTATED RINGERS BOLUS PEDS
1000.0000 mL | Freq: Once | INTRAVENOUS | Status: AC
  Administered 2021-02-16: 1000 mL via INTRAVENOUS

## 2021-02-16 MED ORDER — ACETAMINOPHEN 325 MG PO TABS
650.0000 mg | ORAL_TABLET | Freq: Four times a day (QID) | ORAL | Status: DC | PRN
  Administered 2021-02-17 – 2021-02-28 (×9): 650 mg via ORAL
  Filled 2021-02-16 (×10): qty 2

## 2021-02-16 MED ORDER — ONDANSETRON HCL 4 MG PO TABS: 4.0000 mg | ORAL_TABLET | Freq: Four times a day (QID) | ORAL | Status: DC | PRN

## 2021-02-16 MED ORDER — BISACODYL 5 MG PO TBEC
5.0000 mg | DELAYED_RELEASE_TABLET | Freq: Every day | ORAL | Status: DC | PRN
  Administered 2021-02-22 – 2021-02-23 (×2): 5 mg via ORAL
  Filled 2021-02-16 (×2): qty 1

## 2021-02-16 NOTE — H&P (Addendum)
TRH H&P   Patient Demographics:    Denise Watson, is a 55 y.o. female  MRN: [id number]   DOB - Oct 30, 1966  Admit Date - 02/16/2021  Outpatient Primary MD for the patient is Piedad Climes, [place], [place]     Patient coming from: home  Chief Complaint  Patient presents with  . Shortness of Breath      HPI:    Denise Watson  is a 55 y.o. female, who is in relatively good health except recent diagnosis of essential hypertension requiring HCTZ for the last few weeks, obesity, COVID-19 infection in January, psoriasis, C-section in the past, intermittent palpitations in the past.  Apparently patient has been having symptoms of sore throat for the last 2 weeks and subsequently has been developing gradual fatigue and some exertional shortness of breath, denies any fever chills, no chest or abdominal pain, no blood in stool or urine, no cough or sinus pressure, no swelling in arms or legs, she does have petechial rash but she says this is due to her psoriasis and is chronic.  She presented to the ER where she was found to have AKI with a creatinine of 10, nephrology was consulted and I was requested to admit the patient.  Currently review of systems positive for sore throat, fatigue, exertional shortness of breath, chronic petechial rash due to psoriasis according to the patient.  Other review of systems are negative    Review of systems:     A full 10 point Review of Systems was done, except as stated above, all other Review of Systems were negative.   With Past History of the following :    Past Medical History:  Diagnosis Date  . Swelling of both lower extremities       Past Surgical History:  Procedure  Laterality Date  . CESAREAN SECTION        Social History:     Social History   Tobacco Use  . Smoking status: Never Smoker  . Smokeless tobacco: Never Used  Substance Use Topics  . Alcohol use: Yes    Comment: occ         Family History :     Family History  Problem Relation Age of Onset  . Kidney disease Father        Home Medications:   Prior  to Admission medications   Medication Sig Start Date End Date Taking? Authorizing Provider  amoxicillin (AMOXIL) 875 MG tablet Take 875 mg by mouth 2 (two) times daily. 01/31/21  Yes [provider]  ascorbic acid (VITAMIN C) 500 MG tablet Take 500 mg by mouth daily.   Yes [provider]  Cholecalciferol 25 MCG (1000 UT) tablet Take 1,000 Units by mouth daily.   Yes [provider]  clotrimazole (LOTRIMIN) 1 % cream Apply 1 application topically 2 (two) times daily.   Yes [provider]  clotrimazole-betamethasone (LOTRISONE) cream Apply 1 application topically 2 (two) times daily.   Yes [provider]  hydrochlorothiazide (HYDRODIURIL) 25 MG tablet Take 25 mg by mouth daily.   Yes [provider]  vitamin B-12 (CYANOCOBALAMIN) 500 MCG tablet Take 500 mcg by mouth daily.   Yes [provider]     Allergies:    No Known Allergies   Physical Exam:   Vitals  Blood pressure (!) 117/53, pulse 70, temperature 98.9 F (37.2 C), resp. rate (!) 22, height 5\' 5"  (1.651 m), weight 108.9 kg, SpO2 98 %.   1. General middle-aged obese African-American female lying in hospital bed in no distress on room air,  2. Normal affect and insight, Not Suicidal or Homicidal, Awake Alert,   3. No F.N deficits, ALL C.Nerves Intact, Strength 5/5 all 4 extremities, Sensation intact all 4 extremities, Plantars down going.  4. Ears and Eyes appear Normal, Conjunctivae clear, PERRLA. Moist Oral Mucosa, pharynx is red and has a few shallow ulcers,  5. Supple Neck, No JVD, No tender  cervical lymphadenopathy appriciated, No Carotid Bruits.  6. Symmetrical Chest wall movement, Good air movement bilaterally, CTAB.  7. RRR, No Gallops, Rubs or Murmurs, No Parasternal Heave.  8. Positive Bowel Sounds, Abdomen Soft, No tenderness, No organomegaly appriciated,No rebound -guarding or rigidity.  9.  No Cyanosis, Normal Skin Turgor, does have petechial rash in her extremities but says this is chronic and due to psoriasis,  10. Good muscle tone,  joints appear normal , no effusions, Normal ROM.  11. No Palpable Lymph Nodes in Neck or Axillae      Data Review:    CBC Recent Labs  Lab 02/16/21 1240  WBC 3.5*  HGB 12.1  HCT 38.2  PLT 231  MCV 91.0  MCH 28.8  MCHC 31.7  RDW 13.6   ------------------------------------------------------------------------------------------------------------------  Chemistries  Recent Labs  Lab 02/16/21 1240  NA 132*  K 5.1  CL 98  CO2 20*  GLUCOSE 102*  BUN 103*  CREATININE 10.18*  CALCIUM 7.7*   ------------------------------------------------------------------------------------------------------------------ estimated creatinine clearance is 7.8 mL/min (A) (by C-G formula based on SCr of 10.18 mg/dL (H)). ------------------------------------------------------------------------------------------------------------------ No results for input(s): TSH, T4TOTAL, T3FREE, THYROIDAB in the last 72 hours.  Invalid input(s): FREET3  Coagulation profile No results for input(s): INR, PROTIME in the last 168 hours. ------------------------------------------------------------------------------------------------------------------- No results for input(s): DDIMER in the last 72 hours. -------------------------------------------------------------------------------------------------------------------  Cardiac Enzymes No results for input(s): CKMB, TROPONINI, MYOGLOBIN in the last 168 hours.  Invalid input(s):  CK ------------------------------------------------------------------------------------------------------------------ No results found for: BNP   ---------------------------------------------------------------------------------------------------------------  Urinalysis No results found for: COLORURINE, APPEARANCEUR, LABSPEC, PHURINE, GLUCOSEU, HGBUR, BILIRUBINUR, KETONESUR, PROTEINUR, UROBILINOGEN, NITRITE, LEUKOCYTESUR  ----------------------------------------------------------------------------------------------------------------   Imaging Results:    DG Chest 2 View  Result Date: 02/16/2021 CLINICAL DATA:  Shortness of breath. EXAM: CHEST - 2 VIEW COMPARISON:  January 27, 2021. FINDINGS: The heart size and mediastinal contours are within normal  limits. Both lungs are clear. No pneumothorax or pleural effusion is noted. The visualized skeletal structures are unremarkable. IMPRESSION: No active cardiopulmonary disease. Electronically Signed   By: Lupita Raider M.D.   On: 02/16/2021 12:53    My personal review of EKG: Rhythm NSR, no acute ST changes.   Assessment & Plan:      1.  Generalized fatigue, exertional shortness of breath and pharyngitis.  In a patient with AKI, she was recently diagnosed with hypertension and started with HCTZ as well.  At this time clearly strep throat and related glomerulonephritis needs to be ruled out along with autoimmune glomera nephritis, she is also dehydrated on exam.  She will be admitted to the hospital, IV fluid bolus and maintenance, Foley catheter to monitor strict intake output, CT abdomen pelvis noncontrast to rule out kidney stone and to evaluate base of the lungs for any infiltrate i.e. ruling out pulmonary renal syndrome.  We will check serum and urine electrolytes, ASO titer, C3-C4, ANCA, strep throat PCR, HIV, acute hepatitis panel, double-stranded DNA, UA, CT abdomen pelvis noncontrast as above, case discussed with nephrology agrees  with the plan.   2.  Petechial rash.  Patient claims this is chronic due to psoriasis but could be due to platelet dysfunction caused by AKI.  Will monitor.  3.  Borderline hyperkalemia.  1 dose Kayexalate and monitor.  4.  Exertional shortness of breath, likely due to AKI, check BNP and echo and monitor.  Cautious with hydration.  5.  Obesity.  BMI 39.  Follow with PCP.  6. Hypertension.  Gentle IV hydralazine, will avoid hypotension due to AKI.   DVT Prophylaxis Heparin    AM Labs Ordered, also please review Full Orders  Family Communication: Admission, patients condition and plan of care including tests being ordered have been discussed with the patient who indicates understanding and agree with the plan and Code Status.  Code Status Full  Likely DC to  Home  Condition GUARDED    Consults called: Renal    Admission status: Inpt    Time spent in minutes : 35   Susa Raring M.D on 02/16/2021 at 5:28 PM  To page go to [email or web address] - password [facility]

## 2021-02-16 NOTE — ED Provider Notes (Signed)
[facility] EMERGENCY DEPARTMENT Provider Note   CSN: [id number] Arrival date & time: 02/16/21  1221     History Chief Complaint  Patient presents with  . Shortness of Breath    Denise Watson is a 55 y.o. female.  HPI Denise Watson is a 55 year old woman with past medical history significant for hypertension on thiazide diuretic no recent changes to her medications who presents to the ED on recommendation from her family doctor for worsening symptoms of SOB, cough, and fatigue.  She states that her shortness of breath seems to be at times exertional although she often feels short of breath when she lays flat.  She has not been propping her self up on pillows and has not had any episodes of paroxysmal nocturnal dyspnea.  She denies any lower extremity swelling or chest pain.  She denies any fevers, lightheadedness or dizziness.  Patient states she believes she had Covid in January (was not formally tested) and feels she has never fully recovered. She was vaccinated but had not received the booster. She notes SOB worse on exertion and while lying flat, intermittent cough, fatigue and upper abdominal pain. She also endorses lack of appetite and is making little urine. Denies chest pain, palpitations, leg swelling. No history of heart problems or kidney problems. She was recently seen in ED for double vision and headache. No smoking history.   No recent surgeries, hospitalization, long travel, hemoptysis, estrogen containing OCP, cancer history.  No unilateral leg swelling.  No history of PE or VTE.    Past Medical History:  Diagnosis Date  . Swelling of both lower extremities     Patient Active Problem List   Diagnosis Date Noted  . AKI (acute kidney injury) (HCC) 02/16/2021  . Palpitations 10/20/2013  . Atypical chest pain 10/20/2013  . Morbid obesity (HCC) 10/20/2013    Past Surgical History:  Procedure Laterality Date  . CESAREAN SECTION       OB History    No obstetric history on file.     Family History  Problem Relation Age of Onset  . Kidney disease Father     Social History   Tobacco Use  . Smoking status: Never Smoker  . Smokeless tobacco: Never Used  Substance Use Topics  . Alcohol use: Yes    Comment: occ  . Drug use: No    Home Medications Prior to Admission medications   Medication Sig Start Date End Date Taking? Authorizing Provider  amoxicillin (AMOXIL) 875 MG tablet Take 875 mg by mouth 2 (two) times daily. 01/31/21  Yes [provider]  ascorbic acid (VITAMIN C) 500 MG tablet Take 500 mg by mouth daily.   Yes [provider]  Cholecalciferol 25 MCG (1000 UT) tablet Take 1,000 Units by mouth daily.   Yes [provider]  clotrimazole (LOTRIMIN) 1 % cream Apply 1 application topically 2 (two) times daily.   Yes [provider]  clotrimazole-betamethasone (LOTRISONE) cream Apply 1 application topically 2 (two) times daily.   Yes [provider]  hydrochlorothiazide (HYDRODIURIL) 25 MG tablet Take 25 mg by mouth daily.   Yes [provider]  vitamin B-12 (CYANOCOBALAMIN) 500 MCG tablet Take 500 mcg by mouth daily.   Yes [provider]    Allergies    Patient has no known allergies.  Review of Systems   Review of Systems  Constitutional: Positive for fatigue. Negative for chills and fever.  HENT: Negative for congestion.  Eyes: Negative for pain.  Respiratory: Positive for cough and shortness of breath.   Cardiovascular: Negative for chest pain and leg swelling.  Gastrointestinal: Negative for abdominal pain, diarrhea, nausea and vomiting.  Genitourinary: Positive for decreased urine volume. Negative for dysuria.  Musculoskeletal: Negative for myalgias.  Skin: Negative for rash.  Neurological: Negative for dizziness and headaches.    Physical Exam Updated Vital Signs BP (!) 117/53 (BP Location: Right Arm)   Pulse 70   Temp 98.9 F (37.2 C)    Resp (!) 22   Ht  (1.651 m)   Wt 108.9 kg   SpO2 98%   BMI 39.94 kg/m   Physical Exam Vitals and nursing note reviewed.  Constitutional:      General: She is not in acute distress. HENT:     Head: Normocephalic and atraumatic.     Nose: Nose normal.  Eyes:     General: No scleral icterus. Neck:     Comments: No notable JVP Cardiovascular:     Rate and Rhythm: Normal rate and regular rhythm.     Pulses: Normal pulses.     Heart sounds: Normal heart sounds.  Pulmonary:     Effort: Pulmonary effort is normal. No respiratory distress.     Breath sounds: Rales present. No wheezing.     Comments: Faint crackles in bilateral bases.  No increased work of breathing, questionable tachypnea with rate of 20-22.  Speaking in full sentences Abdominal:     Palpations: Abdomen is soft.     Tenderness: There is no abdominal tenderness.  Musculoskeletal:     Cervical back: Normal range of motion.     Right lower leg: No edema.     Left lower leg: No edema.     Comments: No lower extremity edema, unilateral leg swelling or calf tenderness  Skin:    General: Skin is warm and dry.     Capillary Refill: Capillary refill takes less than 2 seconds.  Neurological:     Mental Status: She is alert. Mental status is at baseline.  Psychiatric:        Mood and Affect: Mood normal.        Behavior: Behavior normal.     ED Results / Procedures / Treatments   Labs (all labs ordered are listed, but only abnormal results are displayed) Labs Reviewed  BASIC METABOLIC PANEL - Abnormal; Notable for the following components:      Result Value   Sodium 132 (*)    CO2 20 (*)    Glucose, Bld 102 (*)    BUN 103 (*)    Creatinine, Ser 10.18 (*)    Calcium 7.7 (*)    GFR, Estimated 4 (*)    All other components within normal limits  CBC - Abnormal; Notable for the following components:   WBC 3.5 (*)    All other components within normal limits  TROPONIN I (HIGH SENSITIVITY) - Abnormal; Notable  for the following components:   Troponin I (High Sensitivity) 23 (*)    All other components within normal limits  SARS CORONAVIRUS 2 (TAT 6-24 HRS)  RESPIRATORY PANEL BY PCR  CULTURE, GROUP A STREP (THRC)  URINALYSIS, ROUTINE W REFLEX MICROSCOPIC  ANTINUCLEAR ANTIBODIES, IFA  ANCA TITERS  MPO/PR-3 (ANCA) ANTIBODIES  ANTI-DNA ANTIBODY, DOUBLE-STRANDED  SODIUM, URINE, RANDOM  CREATININE, URINE, RANDOM  PROTEIN / CREATININE RATIO, URINE  BRAIN NATRIURETIC PEPTIDE  CBC WITH DIFFERENTIAL/PLATELET  COMPREHENSIVE METABOLIC PANEL  MAGNESIUM  ANTISTREPTOLYSIN O TITER  C3 COMPLEMENT  C4 COMPLEMENT  HIV ANTIBODY (ROUTINE TESTING W REFLEX)  HEPATITIS PANEL, ACUTE  CBC  CREATININE, SERUM  TSH  HEMOGLOBIN A1C  PROTIME-INR  TROPONIN I (HIGH SENSITIVITY)    EKG None  Radiology DG Chest 2 View  Result Date: 02/16/2021 CLINICAL DATA:  Shortness of breath. EXAM: CHEST - 2 VIEW COMPARISON:  January 27, 2021. FINDINGS: The heart size and mediastinal contours are within normal limits. Both lungs are clear. No pneumothorax or pleural effusion is noted. The visualized skeletal structures are unremarkable. IMPRESSION: No active cardiopulmonary disease. Electronically Signed   By: Lupita Raider M.D.   On: 02/16/2021 12:53    Procedures .Critical Care Performed by: Gailen Shelter, PA Authorized by: Gailen Shelter, PA   Critical care provider statement:    Critical care time (minutes):  35   Critical care time was exclusive of:  Separately billable procedures and treating other patients and teaching time   Critical care was necessary to treat or prevent imminent or life-threatening deterioration of the following conditions:  Renal failure   Critical care was time spent personally by me on the following activities:  Discussions with consultants, evaluation of patient's response to treatment, examination of patient, review of old charts, re-evaluation of patient's condition, pulse  oximetry, ordering and review of radiographic studies, ordering and review of laboratory studies and ordering and performing treatments and interventions   I assumed direction of critical care for this patient from another provider in my specialty: no       Medications Ordered in ED Medications  0.9 %  sodium chloride infusion (has no administration in time range)  heparin injection 5,000 Units (has no administration in time range)  acetaminophen (TYLENOL) tablet 650 mg (has no administration in time range)    Or  acetaminophen (TYLENOL) suppository 650 mg (has no administration in time range)  bisacodyl (DULCOLAX) EC tablet 5 mg (has no administration in time range)  ondansetron (ZOFRAN) tablet 4 mg (has no administration in time range)    Or  ondansetron (ZOFRAN) injection 4 mg (has no administration in time range)  hydrALAZINE (APRESOLINE) injection 10 mg (has no administration in time range)  lactated ringers bolus PEDS (has no administration in time range)  sodium polystyrene (KAYEXALATE) 15 GM/60ML suspension 30 g (has no administration in time range)    ED Course  I have reviewed the triage vital signs and the nursing notes.  Pertinent labs & imaging results that were available during my care of the patient were reviewed by me and considered in my medical decision making (see chart for details).  Patient is a 55 year old female with past medical history of hypertension on HCTZ presented today with fatigue since January now with some decreased urination cough some shortness of breath with supine reclining and exertion.  Lungs are clear apart from some crackles in bases she does not appear fluid overloaded and does not necessarily appear severely dehydrated either with oral mucosa that is not significantly dry nor is it overly moist. No lower extremity edema no JVP.  Creatinine is significantly worsened from 2 weeks ago was less than 1 previously it is now 10.18 with elevated BUN  with BUN/creatinine ratio is consistent with intrarenal disease.  CBC with leukopenia will check for Covid.  Troponin x1 was elevated at 23 likely this is from decreased renal clearance.  Clinical Course as of 02/16/21 1741  Thu Feb 16, 2021  1649 Discussed with Dr. Allena Katz of nephrology.  Who recommended additional lab work which was ordered.  Also recommended 75 cc/h normal saline for the next 12 hours.  [WF]    Clinical Course User Index [WF] Gailen Shelter, [place]   Patient admitted to hospitalist service. Dr. Thedore Mins to see pt and admit.   MDM Rules/Calculators/A&P                          Denise Watson was evaluated in Emergency Department on 02/16/2021 for the symptoms described in the history of present illness. She was evaluated in the context of the global COVID-19 pandemic, which necessitated consideration that the patient might be at risk for infection with the SARS-CoV-2 virus that causes COVID-19. Institutional protocols and algorithms that pertain to the evaluation of patients at risk for COVID-19 are in a state of rapid change based on information released by regulatory bodies including the CDC and federal and state organizations. These policies and algorithms were followed during the patient's care in the ED.  Final Clinical Impression(s) / ED Diagnoses Final diagnoses:  AKI (acute kidney injury) [facility])    Rx / DC Orders ED Discharge Orders    None       Gailen Shelter, [place] 02/16/21 1742    Tegeler, Canary Brim, MD 02/16/21 2237

## 2021-02-16 NOTE — ED Triage Notes (Signed)
Pt here from [facility] . Provider called and stated patient needed to be seen right away but to her s/s.Marland Kitchen Pt reports abd pain,dizzines,sob,sore throat,weakness.

## 2021-02-16 NOTE — Progress Notes (Signed)
Patient was admitted to 5W08. 1L LR bolus running currently. Patient has no complaints of pain. Vitals stable. Pt has a foley catheter. Skin intact, but dry with psoriasis patches scattered on stomach and legs/bottom. Petechiae on bilateral lower shins. Call bell placed within reach and instructed on safety precautions.

## 2021-02-17 ENCOUNTER — Inpatient Hospital Stay (HOSPITAL_COMMUNITY): Payer: BC Managed Care – PPO

## 2021-02-17 DIAGNOSIS — I5031 Acute diastolic (congestive) heart failure: Secondary | ICD-10-CM

## 2021-02-17 DIAGNOSIS — N179 Acute kidney failure, unspecified: Secondary | ICD-10-CM | POA: Diagnosis not present

## 2021-02-17 LAB — C3 COMPLEMENT: C3 Complement: 32 mg/dL — ABNORMAL LOW (ref 82–167)

## 2021-02-17 LAB — CBC WITH DIFFERENTIAL/PLATELET
Abs Immature Granulocytes: 0 10*3/uL (ref 0.00–0.07)
Basophils Absolute: 0 10*3/uL (ref 0.0–0.1)
Basophils Relative: 0 %
Eosinophils Absolute: 0 10*3/uL (ref 0.0–0.5)
Eosinophils Relative: 0 %
HCT: 36.5 % (ref 36.0–46.0)
Hemoglobin: 11.8 g/dL — ABNORMAL LOW (ref 12.0–15.0)
Lymphocytes Relative: 14 %
Lymphs Abs: 0.4 10*3/uL — ABNORMAL LOW (ref 0.7–4.0)
MCH: 29 pg (ref 26.0–34.0)
MCHC: 32.3 g/dL (ref 30.0–36.0)
MCV: 89.7 fL (ref 80.0–100.0)
Monocytes Absolute: 0.2 10*3/uL (ref 0.1–1.0)
Monocytes Relative: 6 %
Neutro Abs: 2.6 10*3/uL (ref 1.7–7.7)
Neutrophils Relative %: 80 %
Platelets: 214 10*3/uL (ref 150–400)
RBC: 4.07 MIL/uL (ref 3.87–5.11)
RDW: 13.4 % (ref 11.5–15.5)
WBC: 3.2 10*3/uL — ABNORMAL LOW (ref 4.0–10.5)
nRBC: 0 % (ref 0.0–0.2)
nRBC: 1 /100 WBC — ABNORMAL HIGH

## 2021-02-17 LAB — ANTI-DNA ANTIBODY, DOUBLE-STRANDED: ds DNA Ab: >300 IU/mL — ABNORMAL HIGH (ref 0–9)

## 2021-02-17 LAB — COMPREHENSIVE METABOLIC PANEL
ALT: 63 U/L — ABNORMAL HIGH (ref 0–44)
AST: 127 U/L — ABNORMAL HIGH (ref 15–41)
Albumin: 1.5 g/dL — ABNORMAL LOW (ref 3.5–5.0)
Alkaline Phosphatase: 68 U/L (ref 38–126)
Anion gap: 13 (ref 5–15)
BUN: 105 mg/dL — ABNORMAL HIGH (ref 6–20)
CO2: 21 mmol/L — ABNORMAL LOW (ref 22–32)
Calcium: 7.4 mg/dL — ABNORMAL LOW (ref 8.9–10.3)
Chloride: 97 mmol/L — ABNORMAL LOW (ref 98–111)
Creatinine, Ser: 10.53 mg/dL — ABNORMAL HIGH (ref 0.44–1.00)
GFR, Estimated: 4 mL/min — ABNORMAL LOW (ref >60)
Glucose, Bld: 105 mg/dL — ABNORMAL HIGH (ref 70–99)
Potassium: 4.1 mmol/L (ref 3.5–5.1)
Sodium: 131 mmol/L — ABNORMAL LOW (ref 135–145)
Total Bilirubin: 1.3 mg/dL — ABNORMAL HIGH (ref 0.3–1.2)
Total Protein: 5.1 g/dL — ABNORMAL LOW (ref 6.5–8.1)

## 2021-02-17 LAB — PROTEIN / CREATININE RATIO, URINE
Creatinine, Urine: 511.08 mg/dL
Total Protein, Urine: >3000 mg/dL

## 2021-02-17 LAB — URINALYSIS, ROUTINE W REFLEX MICROSCOPIC
Bilirubin Urine: NEGATIVE
Glucose, UA: NEGATIVE mg/dL
Ketones, ur: NEGATIVE mg/dL
Leukocytes,Ua: NEGATIVE
Nitrite: NEGATIVE
Protein, ur: >=300 mg/dL — AB
Specific Gravity, Urine: >1.046 — ABNORMAL HIGH (ref 1.005–1.030)
pH: 5 (ref 5.0–8.0)

## 2021-02-17 LAB — ECHOCARDIOGRAM COMPLETE
Area-P 1/2: 2.42 cm2
Height: 65 in
S' Lateral: 2.8 cm
Weight: 3840 oz

## 2021-02-17 LAB — PROTIME-INR
INR: 1 (ref 0.8–1.2)
Prothrombin Time: 12.9 seconds (ref 11.4–15.2)

## 2021-02-17 LAB — CK: Total CK: 333 U/L — ABNORMAL HIGH (ref 38–234)

## 2021-02-17 LAB — MAGNESIUM: Magnesium: 2.3 mg/dL (ref 1.7–2.4)

## 2021-02-17 LAB — ANTISTREPTOLYSIN O TITER: ASO: 83 IU/mL (ref 0.0–200.0)

## 2021-02-17 LAB — C4 COMPLEMENT: Complement C4, Body Fluid: 7 mg/dL — ABNORMAL LOW (ref 12–38)

## 2021-02-17 LAB — BRAIN NATRIURETIC PEPTIDE: B Natriuretic Peptide: 17.6 pg/mL (ref 0.0–100.0)

## 2021-02-17 MED ORDER — CHLORHEXIDINE GLUCONATE CLOTH 2 % EX PADS
6.0000 | MEDICATED_PAD | Freq: Every day | CUTANEOUS | Status: DC
  Administered 2021-02-17: 6 via TOPICAL

## 2021-02-17 MED ORDER — PHENOL 1.4 % MT LIQD
1.0000 | OROMUCOSAL | Status: DC | PRN
  Filled 2021-02-17: qty 177

## 2021-02-17 NOTE — Progress Notes (Signed)
PT Cancellation Note  Patient Details Name: Denise Watson MRN: [id number] DOB: 23-Jul-1966   Cancelled Treatment:    Reason Eval/Treat Not Completed: Patient at procedure or test/unavailable. Will check back later.   Angelina Ok [facility] 02/17/2021, 9:54 AM Skip Mayer PT Acute Rehabilitation Services Pager [phone] Office [phone]

## 2021-02-17 NOTE — Progress Notes (Signed)
PT Cancellation Note  Patient Details Name: Denise Watson MRN: [id number] DOB: Jun 15, 1966   Cancelled Treatment:    Reason Eval/Treat Not Completed: Fatigue/lethargy limiting ability to participate Pt reports she has just gotten back to bed and requesting to hold on PT. Will follow up as schedule allows.   Farley Ly, PT, DPT  Acute Rehabilitation Services  Pager: [phone] Office: [phone]    Lehman Prom 02/17/2021, 2:14 PM

## 2021-02-17 NOTE — Consult Note (Signed)
Chief Complaint: Patient was seen in consultation today for AKI/random renal biopsy.  Referring Physician(s): Maxie Barb (nephrology)  Supervising Physician: Gilmer Mor  Patient Status: [facility] - In-pt  History of Present Illness: Denise Watson is a 55 y.o. female with a past medical history of hypertension and psoriasis. She presented to [facility] ED 02/16/2021 with complaints of dyspnea, generalized fatigue, and sore throat. In ED, patient found to have AKI. She was admitted for further management. Nephrology was consulted who recommended IR consult for possible random renal biopsy for evaluation of AKI.  IR consulted by Dr. Ronalee Belts for possible image-guided random renal biopsy. Patient awake and alert laying in bed. Complains of dyspnea, improved since admission. Denies fever, chills, chest pain, abdominal pain, or headache.  On SQ Heparin.   Past Medical History:  Diagnosis Date  . Swelling of both lower extremities     Past Surgical History:  Procedure Laterality Date  . CESAREAN SECTION      Allergies: Patient has no known allergies.  Medications: Prior to Admission medications   Medication Sig Start Date End Date Taking? Authorizing Provider  amoxicillin (AMOXIL) 875 MG tablet Take 875 mg by mouth 2 (two) times daily. 01/31/21  Yes [provider]  ascorbic acid (VITAMIN C) 500 MG tablet Take 500 mg by mouth daily.   Yes [provider]  Cholecalciferol 25 MCG (1000 UT) tablet Take 1,000 Units by mouth daily.   Yes [provider]  clotrimazole (LOTRIMIN) 1 % cream Apply 1 application topically 2 (two) times daily.   Yes [provider]  clotrimazole-betamethasone (LOTRISONE) cream Apply 1 application topically 2 (two) times daily.   Yes [provider]  hydrochlorothiazide (HYDRODIURIL) 25 MG tablet Take 25 mg by mouth daily.   Yes [provider]  vitamin B-12 (CYANOCOBALAMIN) 500 MCG tablet Take 500  mcg by mouth daily.   Yes [provider]     Family History  Problem Relation Age of Onset  . Kidney disease Father     Social History   Socioeconomic History  . Marital status: Married    Spouse name: Not on file  . Number of children: Not on file  . Years of education: Not on file  . Highest education level: Not on file  Occupational History  . Not on file  Tobacco Use  . Smoking status: Never Smoker  . Smokeless tobacco: Never Used  Substance and Sexual Activity  . Alcohol use: Yes    Comment: occ  . Drug use: No  . Sexual activity: Not on file  Other Topics Concern  . Not on file  Social History Narrative  . Not on file   Social Determinants of Health   Financial Resource Strain: Not on file  Food Insecurity: Not on file  Transportation Needs: Not on file  Physical Activity: Not on file  Stress: Not on file  Social Connections: Not on file     Review of Systems: A 12 point ROS discussed and pertinent positives are indicated in the HPI above.  All other systems are negative.  Review of Systems  Constitutional: Negative for chills and fever.  Respiratory: Positive for shortness of breath. Negative for wheezing.   Cardiovascular: Negative for chest pain and palpitations.  Gastrointestinal: Negative for abdominal pain.  Neurological: Negative for headaches.  Psychiatric/Behavioral: Negative for behavioral problems and confusion.    Vital Signs: BP 129/62 (BP Location: Left Arm)   Pulse 70   Temp 99 F (37.2  C) (Oral)   Resp 20   Ht 5\' 5"  (1.651 m)   Wt 240 lb (108.9 kg)   SpO2 98%   BMI 39.94 kg/m   Physical Exam Vitals and nursing note reviewed.  Constitutional:      General: She is not in acute distress. Cardiovascular:     Rate and Rhythm: Normal rate and regular rhythm.     Heart sounds: Normal heart sounds. No murmur heard.   Pulmonary:     Effort: Pulmonary effort is normal. No respiratory distress.     Breath sounds: Normal  breath sounds. No wheezing.  Skin:    General: Skin is warm and dry.  Neurological:     Mental Status: She is alert and oriented to person, place, and time.      MD Evaluation Airway: WNL Heart: WNL Abdomen: WNL Chest/ Lungs: WNL ASA  Classification: 3 Mallampati/Airway Score: Two   Imaging: CT ABDOMEN PELVIS WO CONTRAST  Result Date: 02/16/2021 CLINICAL DATA:  Abdominal pain. EXAM: CT ABDOMEN AND PELVIS WITHOUT CONTRAST TECHNIQUE: Multidetector CT imaging of the abdomen and pelvis was performed following the standard protocol without IV contrast. COMPARISON:  None. FINDINGS: Lower chest: No acute abnormality. Hepatobiliary: No focal liver abnormality is seen. No gallstones, gallbladder wall thickening, or biliary dilatation. Pancreas: Unremarkable. No pancreatic ductal dilatation or surrounding inflammatory changes. Spleen: Normal in size without focal abnormality. Adrenals/Urinary Tract: Adrenal glands appear normal. No hydronephrosis or renal obstruction is noted. No renal or ureteral calculi are noted. Urinary bladder is decompressed secondary to Foley catheter. Minimal inflammatory changes are noted around the right kidney suggesting possible pyelonephritis. Stomach/Bowel: Stomach is within normal limits. Appendix appears normal. No evidence of bowel wall thickening, distention, or inflammatory changes. Vascular/Lymphatic: No significant vascular findings are present. No enlarged abdominal or pelvic lymph nodes. Reproductive: No adnexal abnormality is noted. Probable small exophytic uterine fibroid is noted arising posteriorly from the uterus. Other: No abdominal wall hernia or abnormality. No abdominopelvic ascites. Musculoskeletal: No acute or significant osseous findings. IMPRESSION: 1. Minimal inflammatory changes are noted around the right kidney suggesting possible pyelonephritis. 2. No hydronephrosis or renal obstruction is noted. No renal or ureteral calculi are noted. 3. Probable  small exophytic uterine fibroid is noted arising posteriorly from the uterus. Electronically Signed   By: Lupita RaiderJames  Green Jr M.D.   On: 02/16/2021 18:32   CT Angio Head W or Wo Contrast  Result Date: 01/27/2021 CLINICAL DATA:  Diplopia.  Dural sinus thrombosis suspected. EXAM: CT ANGIOGRAPHY HEAD CT VENOGRAM HEAD TECHNIQUE: Multidetector CT imaging of the head was performed using the standard protocol during bolus administration of intravenous contrast. Multiplanar CT image reconstructions and MIPs were obtained to evaluate the arterial anatomy. Additional delayed multidetector CT imaging of the head was performed with multiplanar CT image reconstructions and MIPS to evaluate the venous anatomy. CONTRAST:  100mL OMNIPAQUE IOHEXOL 350 MG/ML SOLN COMPARISON:  None. FINDINGS: CT HEAD Brain: No evidence of acute large vascular territory infarction, acute hemorrhage, hydrocephalus, extra-axial collection or mass lesion/mass effect. Mild narrowing of the mamillopontine distance and effacement of the suprasellar cistern with trace tonsillar ectopia. Skull: No acute fracture. Sinuses: Visualized sinuses are clear. Orbits: No acute finding. CTA HEAD Due to venous timing, significantly limited evaluation. The intradural left vertebral artery is poorly opacified, which could relate to stenosis or non dominant vertebral artery. Otherwise, no evidence of proximal large vessel occlusion or large aneurysm in the anterior or posterior circulation. CT VENOGRAM No evidence of dural sinus  thrombosis. Small left transverse sinus and narrowing of the distal right transverse sinus. Small rounded filling defect in the distal right transverse sinus likely represents arachnoid granulation. IMPRESSION: CT head: 1. No definite evidence of acute intracranial abnormality. 2. Mild narrowing of the mamillopontine distance and effacement of the suprasellar cistern with trace tonsillar ectopia. Given the patient is young and has relative paucity  of CSF spaces/sulci diffusely these findings may be within normal limits, but recommend correlation with signs/symptoms of intracranial hypotension including positional headaches. Additionally, per Dr. Rush Landmark the patient is going to get a follow-up MRI which can further evaluate these findings. CTA head: Due to venous timing, significantly limited evaluation. The intradural left vertebral artery is not well opacified, which is poorly evaluated and could relate to nondominant vertebral artery or stenosis. A CTA neck could further evaluate if clinically indicated. Otherwise, no evidence of proximal large vessel occlusion or large aneurysm. A CTA of the neck could further evaluate the vertebral finding and a repeat CTA head could provide more sensitive intracranial arterial evaluation if clinically indicated. CTV: No evidence of dural sinus thrombosis. Findings discussed with Dr. Julieanne Manson at 3:05 PM via telephone. Electronically Signed   By: Feliberto Harts MD   On: 01/27/2021 15:15   DG Chest 2 View  Result Date: 02/16/2021 CLINICAL DATA:  Shortness of breath. EXAM: CHEST - 2 VIEW COMPARISON:  January 27, 2021. FINDINGS: The heart size and mediastinal contours are within normal limits. Both lungs are clear. No pneumothorax or pleural effusion is noted. The visualized skeletal structures are unremarkable. IMPRESSION: No active cardiopulmonary disease. Electronically Signed   By: Lupita Raider M.D.   On: 02/16/2021 12:53   US Renal  Result Date: 02/16/2021 CLINICAL DATA:  Renal failure EXAM: RENAL / URINARY TRACT ULTRASOUND COMPLETE COMPARISON:  CT 02/16/2021 FINDINGS: Right Kidney: Renal measurements: 11.9 x 5.8 x 4.4 cm = volume: 159.2 mL. Cortex is echogenic. No mass or hydronephrosis. Trace perinephric fluid Left Kidney: Renal measurements: 9.7 x 5.2 x 4.9 cm = volume: 129.5 mL. Cortex is echogenic. No mass or hydronephrosis. Bladder: Empty by Foley catheter. Other: None. IMPRESSION: Echogenic kidneys  bilaterally consistent with medical renal disease. No hydronephrosis Electronically Signed   By: Jasmine Pang M.D.   On: 02/16/2021 20:23   DG Chest Portable 1 View  Result Date: 01/27/2021 CLINICAL DATA:  Persistent cough. Recurrent upper respiratory infection EXAM: PORTABLE CHEST 1 VIEW COMPARISON:  Radiograph 06/27/2016 FINDINGS: 1355 hours. The heart size and mediastinal contours are normal. The lungs are clear. There is no pleural effusion or pneumothorax. No acute osseous findings are identified. Telemetry leads overlie the chest. IMPRESSION: No active cardiopulmonary process. Electronically Signed   By: Carey Bullocks M.D.   On: 01/27/2021 14:47   ECHOCARDIOGRAM COMPLETE  Result Date: 02/17/2021    ECHOCARDIOGRAM REPORT   Patient Name:   GUY TONEY Date of Exam: 02/17/2021 Medical Rec #:  [id number]       Height:       65.0 in Accession #:    [id number]      Weight:       240.0 lb Date of Birth:  06-Aug-1966        BSA:          2.138 m Patient Age:    54 years        BP:           120/60 mmHg Patient Gender: F  HR:           67 bpm. Exam Location:  Inpatient Procedure: 2D Echo Indications:    CHF-Acute Diastolic I50.31  History:        Patient has no prior history of Echocardiogram examinations.  Sonographer:    Thurman Coyer RDCS (AE) Referring Phys: [id number] [facility] IMPRESSIONS  1. Left ventricular ejection fraction, by estimation, is 60%. The left ventricle has normal function. The left ventricle has no regional wall motion abnormalities. There is mild left ventricular hypertrophy. Left ventricular diastolic parameters were low normal.  2. Right ventricular systolic function is normal. The right ventricular size is normal. Tricuspid regurgitation signal is inadequate for assessing PA pressure.  3. The mitral valve is normal in structure. Trivial mitral valve regurgitation. No evidence of mitral stenosis.  4. The aortic valve is tricuspid. Aortic valve regurgitation is not  visualized. No aortic stenosis is present.  5. The inferior vena cava is normal in size with greater than 50% respiratory variability, suggesting right atrial pressure of 3 mmHg. FINDINGS  Left Ventricle: Left ventricular ejection fraction, by estimation, is 60%. The left ventricle has normal function. The left ventricle has no regional wall motion abnormalities. The left ventricular internal cavity size was normal in size. There is mild left ventricular hypertrophy. Left ventricular diastolic parameters were normal. Right Ventricle: The right ventricular size is normal. No increase in right ventricular wall thickness. Right ventricular systolic function is normal. Tricuspid regurgitation signal is inadequate for assessing PA pressure. Left Atrium: Left atrial size was normal in size. Right Atrium: Right atrial size was normal in size. Pericardium: There is no evidence of pericardial effusion. Mitral Valve: The mitral valve is normal in structure. Trivial mitral valve regurgitation. No evidence of mitral valve stenosis. Tricuspid Valve: The tricuspid valve is normal in structure. Tricuspid valve regurgitation is trivial. No evidence of tricuspid stenosis. Aortic Valve: The aortic valve is tricuspid. Aortic valve regurgitation is not visualized. No aortic stenosis is present. Pulmonic Valve: The pulmonic valve was normal in structure. Pulmonic valve regurgitation is trivial. No evidence of pulmonic stenosis. Aorta: The aortic root is normal in size and structure. Venous: The inferior vena cava is normal in size with greater than 50% respiratory variability, suggesting right atrial pressure of 3 mmHg. IAS/Shunts: The interatrial septum was not well visualized.  LEFT VENTRICLE PLAX 2D LVIDd:         4.10 cm  Diastology LVIDs:         2.80 cm  LV e' medial:    6.53 cm/s LV PW:         1.20 cm  LV E/e' medial:  11.0 LV IVS:        1.20 cm  LV e' lateral:   9.57 cm/s LVOT diam:     2.10 cm  LV E/e' lateral: 7.5 LV SV:          76 LV SV Index:   36 LVOT Area:     3.46 cm  RIGHT VENTRICLE RV S prime:     15.40 cm/s TAPSE (M-mode): 1.4 cm LEFT ATRIUM             Index       RIGHT ATRIUM          Index LA diam:        3.10 cm 1.45 cm/m  RA Area:     9.08 cm LA Vol (A2C):   27.6 ml 12.91 ml/m RA Volume:   15.80  ml 7.39 ml/m LA Vol (A4C):   39.6 ml 18.53 ml/m LA Biplane Vol: 35.3 ml 16.51 ml/m  AORTIC VALVE LVOT Vmax:   107.00 cm/s LVOT Vmean:  64.800 cm/s LVOT VTI:    0.220 m  AORTA Ao Root diam: 2.80 cm MITRAL VALVE MV Area (PHT): 2.42 cm    SHUNTS MV Decel Time: 313 msec    Systemic VTI:  0.22 m MV E velocity: 71.60 cm/s  Systemic Diam: 2.10 cm MV A velocity: 73.30 cm/s MV E/A ratio:  0.98 Weston Brass MD Electronically signed by Weston Brass MD Signature Date/Time: 02/17/2021/1:13:33 PM    Final    CT VENOGRAM HEAD  Result Date: 01/27/2021 CLINICAL DATA:  Diplopia.  Dural sinus thrombosis suspected. EXAM: CT ANGIOGRAPHY HEAD CT VENOGRAM HEAD TECHNIQUE: Multidetector CT imaging of the head was performed using the standard protocol during bolus administration of intravenous contrast. Multiplanar CT image reconstructions and MIPs were obtained to evaluate the arterial anatomy. Additional delayed multidetector CT imaging of the head was performed with multiplanar CT image reconstructions and MIPS to evaluate the venous anatomy. CONTRAST:  OMNIPAQUE IOHEXOL 350 MG/ML SOLN COMPARISON:  None. FINDINGS: CT HEAD Brain: No evidence of acute large vascular territory infarction, acute hemorrhage, hydrocephalus, extra-axial collection or mass lesion/mass effect. Mild narrowing of the mamillopontine distance and effacement of the suprasellar cistern with trace tonsillar ectopia. Skull: No acute fracture. Sinuses: Visualized sinuses are clear. Orbits: No acute finding. CTA HEAD Due to venous timing, significantly limited evaluation. The intradural left vertebral artery is poorly opacified, which could relate to stenosis or non  dominant vertebral artery. Otherwise, no evidence of proximal large vessel occlusion or large aneurysm in the anterior or posterior circulation. CT VENOGRAM No evidence of dural sinus thrombosis. Small left transverse sinus and narrowing of the distal right transverse sinus. Small rounded filling defect in the distal right transverse sinus likely represents arachnoid granulation. IMPRESSION: CT head: 1. No definite evidence of acute intracranial abnormality. 2. Mild narrowing of the mamillopontine distance and effacement of the suprasellar cistern with trace tonsillar ectopia. Given the patient is young and has relative paucity of CSF spaces/sulci diffusely these findings may be within normal limits, but recommend correlation with signs/symptoms of intracranial hypotension including positional headaches. Additionally, per Dr. Rush Landmark the patient is going to get a follow-up MRI which can further evaluate these findings. CTA head: Due to venous timing, significantly limited evaluation. The intradural left vertebral artery is not well opacified, which is poorly evaluated and could relate to nondominant vertebral artery or stenosis. A CTA neck could further evaluate if clinically indicated. Otherwise, no evidence of proximal large vessel occlusion or large aneurysm. A CTA of the neck could further evaluate the vertebral finding and a repeat CTA head could provide more sensitive intracranial arterial evaluation if clinically indicated. CTV: No evidence of dural sinus thrombosis. Findings discussed with Dr. Julieanne Manson at 3:05 PM via telephone. Electronically Signed   By: Feliberto Harts MD   On: 01/27/2021 15:15    Labs:  CBC: Recent Labs    01/27/21 1229 02/16/21 1240 02/16/21 1724 02/17/21 0047  WBC 3.9* 3.5* 3.3* 3.2*  HGB 12.0 12.1 12.7 11.8*  HCT 37.3 38.2 38.6 36.5  PLT 195 231 229 214    COAGS: Recent Labs    02/17/21 0047  INR 1.0    BMP: Recent Labs    01/27/21 1229 02/16/21 1240  02/16/21 1724 02/17/21 0047  NA 139 132*  --  131*  K 4.0  5.1  --  4.1  CL 103 98  --  97*  CO2 27 20*  --  21*  GLUCOSE 93 102*  --  105*  BUN 13 103*  --  105*  CALCIUM 8.6* 7.7*  --  7.4*  CREATININE 0.97 10.18* 10.45* 10.53*  GFRNONAA >60 4* 4* 4*    LIVER FUNCTION TESTS: Recent Labs    01/27/21 1229 02/16/21 1940 02/17/21 0047  BILITOT 0.3 1.3* 1.3*  AST 33 123* 127*  ALT 25 62* 63*  ALKPHOS 69 69 68  PROT 7.7 5.1* 5.1*  ALBUMIN 3.6 1.5* 1.5*     Assessment and Plan:  AKI without known etiology. Plan for image-guided random renal biopsy in IR tentatively for Monday 02/20/2021 pending IR scheduling. Patient will be NPO at midnight prior to procedure. Afebrile. Will hold SQ Heparin per IR protocol. INR 1.0 today.  Risks and benefits discussed with the patient including, but not limited to bleeding, infection, damage to adjacent structures or low yield requiring additional tests. All of the patient's questions were answered, patient is agreeable to proceed. Consent signed and in IR control room.   Thank you for this interesting consult.  I greatly enjoyed meeting [facility] and look forward to participating in their care.  A copy of this report was sent to the requesting provider on this date.  Electronically Signed: Elwin Mocha, PA-C 02/17/2021, 2:41 PM   I spent a total of 20 Minutes in face to face in clinical consultation, greater than 50% of which was counseling/coordinating care for AKI/random renal biopsy.

## 2021-02-17 NOTE — Progress Notes (Signed)
PROGRESS NOTE                                                                                                                                                                                                             Patient Demographics:    Denise Watson, is a 55 y.o. female, DOB - 07/07/1966, NFA:[id number]  Outpatient Primary MD for the patient is Piedad Climes, [place]    LOS - 1  Admit date - 02/16/2021    Chief Complaint  Patient presents with  . Shortness of Breath       Brief Narrative (HPI from H&P) -  Denise Watson  is a 55 y.o. female, who is in relatively good health except recent diagnosis of essential hypertension requiring HCTZ for the last few weeks, obesity, COVID-19 infection in January, psoriasis, C-section in the past, intermittent palpitations in the past.  Apparently patient has been having symptoms of sore throat for the last 2 weeks and subsequently has been developing gradual fatigue and some exertional shortness of breath, denies any fever chills, no chest or abdominal pain, no blood in stool or urine, no cough or sinus pressure, no swelling in arms or legs, she does have petechial rash but she says this is due to her psoriasis and is chronic.  She presented to the ER where she was found to have AKI with a creatinine of 10, nephrology was consulted and I was requested to admit the patient.  Currently review of systems positive for sore throat, fatigue, exertional shortness of breath, chronic petechial rash due to psoriasis according to the patient.  Other review of systems are negative   Subjective:    [name] today has, No headache, No chest pain, No abdominal pain - No Nausea, No new weakness tingling or numbness, no SOB at rest.     Assessment  & Plan :      1.  Generalized fatigue, exertional shortness of breath and pharyngitis.  In a patient with AKI, she was recently diagnosed  with hypertension and started with HCTZ as well.  Does have a pharyngitis for which strep throat PCR is pending, will empirically start her on amoxicillin on 02/17/2021, full immunological work-up is underway, UA noted to have significant proteinuria suggesting nephrotic range, suspicious for glomera nephritis, interestingly there is no blood in the urine.  Kidney ultrasound nonacute, nephrology on board, will drop IV fluid rate as she is now developing some crackles.   2.  Petechial rash.  Patient claims this is chronic due to psoriasis but could be due to platelet dysfunction caused by AKI.  Will monitor.  Platelet counts are stable pressure stable.  3.  Borderline hyperkalemia.  1 dose Kayexalate and monitor.  4.  Exertional shortness of breath, likely due to AKI, check BNP and echo and monitor.  Cautious with hydration.  5.  Obesity.  BMI 39.  Follow with PCP.  6. Hypertension.  Gentle IV hydralazine, will avoid hypotension due to AKI.  7.  Pharyngitis.  See #1 above.      Condition -   Guarded  Family Communication  : [name] [phone]  02/17/2021  Code Status :  Full  Consults  :  Renal  PUD Prophylaxis :    Procedures  :     Renal US - medical renal disease.  TTE      Disposition Plan  :    Status is: Inpatient  Remains inpatient appropriate because:IV treatments appropriate due to intensity of illness or inability to take PO   Dispo: The patient is from: Home              Anticipated d/c is to: Home              Patient currently is not medically stable to d/c.   Difficult to place patient No  DVT Prophylaxis  :  Heparin   Lab Results  Component Value Date   PLT 214 02/17/2021    Diet :  Diet Order            Diet renal with fluid restriction Fluid restriction: 1200 mL Fluid; Room service appropriate? Yes; Fluid consistency: Thin  Diet effective now                  Inpatient Medications  Scheduled Meds: . Chlorhexidine  Gluconate Cloth  6 each Topical Daily  . heparin  5,000 Units Subcutaneous Q8H   Continuous Infusions: . sodium chloride 125 mL/hr at 02/16/21 2308   PRN Meds:.acetaminophen **OR** [DISCONTINUED] acetaminophen, bisacodyl, hydrALAZINE, [DISCONTINUED] ondansetron **OR** ondansetron (ZOFRAN) IV, phenol  Antibiotics  :    Anti-infectives (From admission, onward)   None       Time Spent in minutes  30   Susa Raring M.D on 02/17/2021 at 11:04 AM  To page go to [email or web address]   [facility] -  Office  [phone]   See all Orders from today for further details    Objective:   Vitals:   02/16/21 1842 02/16/21 1900 02/16/21 2335 02/17/21 0352  BP:  (!) 118/55 119/74 120/60  Pulse:  67 62 65  Resp:  17 19 16   Temp: 98.7 F (37.1 C) 98.5 F (36.9 C) 98.2 F (36.8 C) 98 F (36.7 C)  TempSrc: Oral Oral Oral Oral  SpO2:  97% 95% 90%  Weight:      Height:        Wt Readings from Last 3 Encounters:  02/16/21 108.9 kg  01/27/21 108.9 kg  06/27/16 132 kg     Intake/Output Summary (Last 24 hours) at 02/17/2021 1104 Last data filed at 02/17/2021 02/19/2021 Gross per 24 hour  Intake 925 ml  Output 105 ml  Net 820 ml     Physical Exam  Awake Alert, No new F.N deficits, Normal affect [place].AT,PERRAL Supple Neck,No JVD, No cervical  lymphadenopathy appriciated.  Symmetrical Chest wall movement, Good air movement bilaterally, few rales RRR,No Gallops,Rubs or new Murmurs, No Parasternal Heave +ve B.Sounds, Abd Soft, No tenderness, No organomegaly appriciated, No rebound - guarding or rigidity. No Cyanosis, Clubbing or edema, No new Rash or bruise     Data Review:    CBC Recent Labs  Lab 02/16/21 1240 02/16/21 1724 02/17/21 0047  WBC 3.5* 3.3* 3.2*  HGB 12.1 12.7 11.8*  HCT 38.2 38.6 36.5  PLT 231 229 214  MCV 91.0 90.6 89.7  MCH 28.8 29.8 29.0  MCHC 31.7 32.9 32.3  RDW 13.6 13.6 13.4  LYMPHSABS  --   --  0.4*  MONOABS  --   --  0.2  EOSABS  --   --  0.0   BASOSABS  --   --  0.0    Recent Labs  Lab 02/16/21 1240 02/16/21 1724 02/16/21 1940 02/17/21 0047  NA 132*  --   --  131*  K 5.1  --   --  4.1  CL 98  --   --  97*  CO2 20*  --   --  21*  GLUCOSE 102*  --   --  105*  BUN 103*  --   --  105*  CREATININE 10.18* 10.45*  --  10.53*  CALCIUM 7.7*  --   --  7.4*  AST  --   --  123* 127*  ALT  --   --  62* 63*  ALKPHOS  --   --  69 68  BILITOT  --   --  1.3* 1.3*  ALBUMIN  --   --  1.5* 1.5*  MG  --   --   --  2.3  INR  --   --   --  1.0  TSH  --   --  1.836  --   HGBA1C  --   --  6.3*  --   BNP  --   --   --  17.6    ------------------------------------------------------------------------------------------------------------------ No results for input(s): CHOL, HDL, LDLCALC, TRIG, CHOLHDL, LDLDIRECT in the last 72 hours.  Lab Results  Component Value Date   HGBA1C 6.3 (H) 02/16/2021   ------------------------------------------------------------------------------------------------------------------ Recent Labs    02/16/21 1940  TSH 1.836    Cardiac Enzymes No results for input(s): CKMB, TROPONINI, MYOGLOBIN in the last 168 hours.  Invalid input(s): CK ------------------------------------------------------------------------------------------------------------------    Component Value Date/Time   BNP 17.6 02/17/2021 0047    Micro Results Recent Results (from the past 240 hour(s))  SARS CORONAVIRUS 2 (TAT 6-24 HRS) Nasopharyngeal Nasopharyngeal Swab     Status: None   Collection Time: 02/16/21  4:25 PM   Specimen: Nasopharyngeal Swab  Result Value Ref Range Status   SARS Coronavirus 2 NEGATIVE NEGATIVE Final    Comment: (NOTE) SARS-CoV-2 target nucleic acids are NOT DETECTED.  The SARS-CoV-2 RNA is generally detectable in upper and lower respiratory specimens during the acute phase of infection. Negative results do not preclude SARS-CoV-2 infection, do not rule out co-infections with other pathogens, and  should not be used as the sole basis for treatment or other patient management decisions. Negative results must be combined with clinical observations, patient history, and epidemiological information. The expected result is Negative.  Fact Sheet for Patients: HairSlick.no  Fact Sheet for Healthcare Providers: quierodirigir.com  This test is not yet approved or cleared by the [place] FDA and  has been authorized for detection and/or diagnosis of SARS-CoV-2 by  FDA under an Emergency Use Authorization (EUA). This EUA will remain  in effect (meaning this test can be used) for the duration of the COVID-19 declaration under Se ction 564(b)(1) of the Act, 21 U.S.C. section 360bbb-3(b)(1), unless the authorization is terminated or revoked sooner.  Performed at [facility], [address] [place], [place] [address]   Respiratory (~20 pathogens) panel by PCR     Status: None   Collection Time: 02/16/21  5:06 PM   Specimen: Nasopharyngeal Swab; Respiratory  Result Value Ref Range Status   Adenovirus NOT DETECTED NOT DETECTED Final   Coronavirus 229E NOT DETECTED NOT DETECTED Final    Comment: (NOTE) The Coronavirus on the Respiratory Panel, DOES NOT test for the novel  Coronavirus (2019 nCoV)    Coronavirus HKU1 NOT DETECTED NOT DETECTED Final   Coronavirus NL63 NOT DETECTED NOT DETECTED Final   Coronavirus OC43 NOT DETECTED NOT DETECTED Final   Metapneumovirus NOT DETECTED NOT DETECTED Final   Rhinovirus / Enterovirus NOT DETECTED NOT DETECTED Final   Influenza A NOT DETECTED NOT DETECTED Final   Influenza B NOT DETECTED NOT DETECTED Final   Parainfluenza Virus 1 NOT DETECTED NOT DETECTED Final   Parainfluenza Virus 2 NOT DETECTED NOT DETECTED Final   Parainfluenza Virus 3 NOT DETECTED NOT DETECTED Final   Parainfluenza Virus 4 NOT DETECTED NOT DETECTED Final   Respiratory Syncytial Virus NOT DETECTED NOT DETECTED  Final   Bordetella pertussis NOT DETECTED NOT DETECTED Final   Bordetella Parapertussis NOT DETECTED NOT DETECTED Final   Chlamydophila pneumoniae NOT DETECTED NOT DETECTED Final   Mycoplasma pneumoniae NOT DETECTED NOT DETECTED Final    Comment: Performed at [facility], [address] [place], [place] [address]    Radiology Reports CT ABDOMEN PELVIS WO CONTRAST  Result Date: 02/16/2021 CLINICAL DATA:  Abdominal pain. EXAM: CT ABDOMEN AND PELVIS WITHOUT CONTRAST TECHNIQUE: Multidetector CT imaging of the abdomen and pelvis was performed following the standard protocol without IV contrast. COMPARISON:  None. FINDINGS: Lower chest: No acute abnormality. Hepatobiliary: No focal liver abnormality is seen. No gallstones, gallbladder wall thickening, or biliary dilatation. Pancreas: Unremarkable. No pancreatic ductal dilatation or surrounding inflammatory changes. Spleen: Normal in size without focal abnormality. Adrenals/Urinary Tract: Adrenal glands appear normal. No hydronephrosis or renal obstruction is noted. No renal or ureteral calculi are noted. Urinary bladder is decompressed secondary to Foley catheter. Minimal inflammatory changes are noted around the right kidney suggesting possible pyelonephritis. Stomach/Bowel: Stomach is within normal limits. Appendix appears normal. No evidence of bowel wall thickening, distention, or inflammatory changes. Vascular/Lymphatic: No significant vascular findings are present. No enlarged abdominal or pelvic lymph nodes. Reproductive: No adnexal abnormality is noted. Probable small exophytic uterine fibroid is noted arising posteriorly from the uterus. Other: No abdominal wall hernia or abnormality. No abdominopelvic ascites. Musculoskeletal: No acute or significant osseous findings. IMPRESSION: 1. Minimal inflammatory changes are noted around the right kidney suggesting possible pyelonephritis. 2. No hydronephrosis or renal obstruction is noted. No renal or  ureteral calculi are noted. 3. Probable small exophytic uterine fibroid is noted arising posteriorly from the uterus. Electronically Signed   By: Lupita Raider M.D.   On: 02/16/2021 18:32   CT Angio Head W or Wo Contrast  Result Date: 01/27/2021 CLINICAL DATA:  Diplopia.  Dural sinus thrombosis suspected. EXAM: CT ANGIOGRAPHY HEAD CT VENOGRAM HEAD TECHNIQUE: Multidetector CT imaging of the head was performed using the standard protocol during bolus administration of intravenous contrast. Multiplanar  CT image reconstructions and MIPs were obtained to evaluate the arterial anatomy. Additional delayed multidetector CT imaging of the head was performed with multiplanar CT image reconstructions and MIPS to evaluate the venous anatomy. CONTRAST:  100mL OMNIPAQUE IOHEXOL 350 MG/ML SOLN COMPARISON:  None. FINDINGS: CT HEAD Brain: No evidence of acute large vascular territory infarction, acute hemorrhage, hydrocephalus, extra-axial collection or mass lesion/mass effect. Mild narrowing of the mamillopontine distance and effacement of the suprasellar cistern with trace tonsillar ectopia. Skull: No acute fracture. Sinuses: Visualized sinuses are clear. Orbits: No acute finding. CTA HEAD Due to venous timing, significantly limited evaluation. The intradural left vertebral artery is poorly opacified, which could relate to stenosis or non dominant vertebral artery. Otherwise, no evidence of proximal large vessel occlusion or large aneurysm in the anterior or posterior circulation. CT VENOGRAM No evidence of dural sinus thrombosis. Small left transverse sinus and narrowing of the distal right transverse sinus. Small rounded filling defect in the distal right transverse sinus likely represents arachnoid granulation. IMPRESSION: CT head: 1. No definite evidence of acute intracranial abnormality. 2. Mild narrowing of the mamillopontine distance and effacement of the suprasellar cistern with trace tonsillar ectopia. Given the  patient is young and has relative paucity of CSF spaces/sulci diffusely these findings may be within normal limits, but recommend correlation with signs/symptoms of intracranial hypotension including positional headaches. Additionally, per Dr. Rush Landmarkegeler the patient is going to get a follow-up MRI which can further evaluate these findings. CTA head: Due to venous timing, significantly limited evaluation. The intradural left vertebral artery is not well opacified, which is poorly evaluated and could relate to nondominant vertebral artery or stenosis. A CTA neck could further evaluate if clinically indicated. Otherwise, no evidence of proximal large vessel occlusion or large aneurysm. A CTA of the neck could further evaluate the vertebral finding and a repeat CTA head could provide more sensitive intracranial arterial evaluation if clinically indicated. CTV: No evidence of dural sinus thrombosis. Findings discussed with Dr. Julieanne Mansonegler at 3:05 PM via telephone. Electronically Signed   By: Feliberto HartsFrederick S Jones MD   On: 01/27/2021 15:15   DG Chest 2 View  Result Date: 02/16/2021 CLINICAL DATA:  Shortness of breath. EXAM: CHEST - 2 VIEW COMPARISON:  January 27, 2021. FINDINGS: The heart size and mediastinal contours are within normal limits. Both lungs are clear. No pneumothorax or pleural effusion is noted. The visualized skeletal structures are unremarkable. IMPRESSION: No active cardiopulmonary disease. Electronically Signed   By: Lupita RaiderJames  Green Jr M.D.   On: 02/16/2021 12:53   US Renal  Result Date: 02/16/2021 CLINICAL DATA:  Renal failure EXAM: RENAL / URINARY TRACT ULTRASOUND COMPLETE COMPARISON:  CT 02/16/2021 FINDINGS: Right Kidney: Renal measurements: 11.9 x 5.8 x 4.4 cm = volume: 159.2 mL. Cortex is echogenic. No mass or hydronephrosis. Trace perinephric fluid Left Kidney: Renal measurements: 9.7 x 5.2 x 4.9 cm = volume: 129.5 mL. Cortex is echogenic. No mass or hydronephrosis. Bladder: Empty by Foley catheter.  Other: None. IMPRESSION: Echogenic kidneys bilaterally consistent with medical renal disease. No hydronephrosis Electronically Signed   By: Jasmine PangKim  Fujinaga M.D.   On: 02/16/2021 20:23   DG Chest Portable 1 View  Result Date: 01/27/2021 CLINICAL DATA:  Persistent cough. Recurrent upper respiratory infection EXAM: PORTABLE CHEST 1 VIEW COMPARISON:  Radiograph 06/27/2016 FINDINGS: 1355 hours. The heart size and mediastinal contours are normal. The lungs are clear. There is no pleural effusion or pneumothorax. No acute osseous findings are identified. Telemetry leads overlie the chest. IMPRESSION:  No active cardiopulmonary process. Electronically Signed   By: Carey Bullocks M.D.   On: 01/27/2021 14:47   CT VENOGRAM HEAD  Result Date: 01/27/2021 CLINICAL DATA:  Diplopia.  Dural sinus thrombosis suspected. EXAM: CT ANGIOGRAPHY HEAD CT VENOGRAM HEAD TECHNIQUE: Multidetector CT imaging of the head was performed using the standard protocol during bolus administration of intravenous contrast. Multiplanar CT image reconstructions and MIPs were obtained to evaluate the arterial anatomy. Additional delayed multidetector CT imaging of the head was performed with multiplanar CT image reconstructions and MIPS to evaluate the venous anatomy. CONTRAST:  OMNIPAQUE IOHEXOL 350 MG/ML SOLN COMPARISON:  None. FINDINGS: CT HEAD Brain: No evidence of acute large vascular territory infarction, acute hemorrhage, hydrocephalus, extra-axial collection or mass lesion/mass effect. Mild narrowing of the mamillopontine distance and effacement of the suprasellar cistern with trace tonsillar ectopia. Skull: No acute fracture. Sinuses: Visualized sinuses are clear. Orbits: No acute finding. CTA HEAD Due to venous timing, significantly limited evaluation. The intradural left vertebral artery is poorly opacified, which could relate to stenosis or non dominant vertebral artery. Otherwise, no evidence of proximal large vessel occlusion or  large aneurysm in the anterior or posterior circulation. CT VENOGRAM No evidence of dural sinus thrombosis. Small left transverse sinus and narrowing of the distal right transverse sinus. Small rounded filling defect in the distal right transverse sinus likely represents arachnoid granulation. IMPRESSION: CT head: 1. No definite evidence of acute intracranial abnormality. 2. Mild narrowing of the mamillopontine distance and effacement of the suprasellar cistern with trace tonsillar ectopia. Given the patient is young and has relative paucity of CSF spaces/sulci diffusely these findings may be within normal limits, but recommend correlation with signs/symptoms of intracranial hypotension including positional headaches. Additionally, per Dr. Rush Landmark the patient is going to get a follow-up MRI which can further evaluate these findings. CTA head: Due to venous timing, significantly limited evaluation. The intradural left vertebral artery is not well opacified, which is poorly evaluated and could relate to nondominant vertebral artery or stenosis. A CTA neck could further evaluate if clinically indicated. Otherwise, no evidence of proximal large vessel occlusion or large aneurysm. A CTA of the neck could further evaluate the vertebral finding and a repeat CTA head could provide more sensitive intracranial arterial evaluation if clinically indicated. CTV: No evidence of dural sinus thrombosis. Findings discussed with Dr. Julieanne Manson at 3:05 PM via telephone. Electronically Signed   By: Feliberto Harts MD   On: 01/27/2021 15:15

## 2021-02-17 NOTE — Progress Notes (Signed)
  Echocardiogram 2D Echocardiogram has been performed.  Denise Watson 02/17/2021, 10:33 AM

## 2021-02-17 NOTE — Consult Note (Signed)
[facility] Nephrology Consultation Note  Requesting MD: Dr. Thedore Mins, P Reason for consult: AKI  HPI:  Denise Watson is a 55 y.o. female with history of psoriasis, COVID-19 infection in January this year, recent diagnosis of hypertension and started on HCTZ, presented with generalized fatigue, some shortness of breath, sore throat seen as a consultation for the evaluation of acute kidney injury. Patient said she was following closely with her PCP for generalized weakness, fatigue associated with sore throat for last 3 weeks.  Reportedly she was prescribed oral penicillin and took for about 2 weeks of amoxicillin.  Her symptoms did not get better therefore she was directed to come to the emergency by her PCP. In the ER, SBP 90/64, afebrile, on room air.  The labs showed BUN 103, creatinine level 10.18, albumin 1.5 with unremarkable CBC.  Patient was a started on IV fluid and after repeat lab today showed creatinine level of 10.5 today, BUN 105.  Urine output is not really recorded.  The urinalysis with protein >300, without RBC or WBC.  The patient had a CT scan of head and chest on 2/25 with IV contrast for double vision.  It showed no acute finding.  The CT scan done during this admission without contrast with no hydronephrosis or renal calculi are noted.  The kidney ultrasound showed bilateral echogenic kidneys. She reports frothy urine without any different color or cola colored urine.  Denies decreased urine output but thinks that she is eating or drinking less mainly because of sore throat.  Denies chest pain, nausea, vomiting, dysgeusia, headache, dizziness.  Occasional intermittent abdomen pain but not currently.  No new skin rash or a small joint pain.  No use of NSAIDs or over-the-counter.  She goes to exercise around 3 days a week does mainly cardio. Today, she continues to feel tired.  Minimal short of breath but not requiring oxygen.  Her husband and daughter at the  bedside. She works in Production designer, theatre/television/film.  Her father was on dialysis due to ESRD.  Creatinine, Ser  Date/Time Value Ref Range Status  02/17/2021 12:47 AM 10.53 (H) 0.44 - 1.00 mg/dL Final  82/42/3536 14:43 PM 10.45 (H) 0.44 - 1.00 mg/dL Final  15/40/0867 61:95 PM 10.18 (H) 0.44 - 1.00 mg/dL Final  09/32/6712 45:80 PM 0.97 0.44 - 1.00 mg/dL Final  99/83/3825 05:39 PM 1.08 (H) 0.44 - 1.00 mg/dL Final     PMHx:   Past Medical History:  Diagnosis Date  . Swelling of both lower extremities     Past Surgical History:  Procedure Laterality Date  . CESAREAN SECTION      Family Hx:  Family History  Problem Relation Age of Onset  . Kidney disease Father     Social History:  reports that she has never smoked. She has never used smokeless tobacco. She reports current alcohol use. She reports that she does not use drugs.  Allergies: No Known Allergies  Medications: Prior to Admission medications   Medication Sig Start Date End Date Taking? Authorizing Provider  amoxicillin (AMOXIL) 875 MG tablet Take 875 mg by mouth 2 (two) times daily. 01/31/21  Yes [provider]  ascorbic acid (VITAMIN C) 500 MG tablet Take 500 mg by mouth daily.   Yes [provider]  Cholecalciferol 25 MCG (1000 UT) tablet Take 1,000 Units by mouth daily.   Yes [provider]  clotrimazole (LOTRIMIN) 1 % cream Apply 1 application topically 2 (two) times daily.   Yes [provider]  clotrimazole-betamethasone (LOTRISONE) cream Apply 1 application topically 2 (two) times daily.   Yes [provider]  hydrochlorothiazide (HYDRODIURIL) 25 MG tablet Take 25 mg by mouth daily.   Yes [provider]  vitamin B-12 (CYANOCOBALAMIN) 500 MCG tablet Take 500 mcg by mouth daily.   Yes [provider]    I have reviewed the patient's current medications.  Labs:  Results for orders placed or performed during the hospital encounter of 02/16/21 (from the past  48 hour(s))  Basic metabolic panel     Status: Abnormal   Collection Time: 02/16/21 12:40 PM  Result Value Ref Range   Sodium 132 (L) 135 - 145 mmol/L   Potassium 5.1 3.5 - 5.1 mmol/L   Chloride 98 98 - 111 mmol/L   CO2 20 (L) 22 - 32 mmol/L   Glucose, Bld 102 (H) 70 - 99 mg/dL    Comment: Glucose reference range applies only to samples taken after fasting for at least 8 hours.   BUN 103 (H) 6 - 20 mg/dL   Creatinine, Ser 10.18 (H) 0.44 - 1.00 mg/dL   Calcium 7.7 (L) 8.9 - 10.3 mg/dL   GFR, Estimated 4 (L) >60 mL/min    Comment: (NOTE) Calculated using the CKD-EPI Creatinine Equation (2021)    Anion gap 14 5 - 15    Comment: Performed at [facility] [address], [place], [place] [address]  CBC     Status: Abnormal   Collection Time: 02/16/21 12:40 PM  Result Value Ref Range   WBC 3.5 (L) 4.0 - 10.5 K/uL   RBC 4.20 3.87 - 5.11 MIL/uL   Hemoglobin 12.1 12.0 - 15.0 g/dL   HCT 38.2 36.0 - 46.0 %   MCV 91.0 80.0 - 100.0 fL   MCH 28.8 26.0 - 34.0 pg   MCHC 31.7 30.0 - 36.0 g/dL   RDW 13.6 11.5 - 15.5 %   Platelets 231 150 - 400 K/uL   nRBC 0.0 0.0 - 0.2 %    Comment: Performed at [facility], [place] [address] [place], [place] [address]  Troponin I (High Sensitivity)     Status: Abnormal   Collection Time: 02/16/21 12:40 PM  Result Value Ref Range   Troponin I (High Sensitivity) 23 (H) <18 ng/L    Comment: (NOTE) Elevated high sensitivity troponin I (hsTnI) values and significant  changes across serial measurements may suggest ACS but many other  chronic and acute conditions are known to elevate hsTnI results.  Refer to the Links section for chest pain algorithms and additional  guidance. Performed at [facility], [place] [address] [place], [place] [address]   SARS CORONAVIRUS 2 (TAT 6-24 HRS) Nasopharyngeal Nasopharyngeal Swab     Status: None   Collection Time: 02/16/21  4:25 PM   Specimen: Nasopharyngeal Swab  Result Value Ref Range   SARS  Coronavirus 2 NEGATIVE NEGATIVE    Comment: (NOTE) SARS-CoV-2 target nucleic acids are NOT DETECTED.  The SARS-CoV-2 RNA is generally detectable in upper and lower respiratory specimens during the acute phase of infection. Negative results do not preclude SARS-CoV-2 infection, do not rule out co-infections with other pathogens, and should not be used as the sole basis for treatment or other patient management decisions. Negative results must be combined with clinical observations, patient history, and epidemiological information. The expected result is Negative.  Fact Sheet for Patients: SugarRoll.be  Fact Sheet for Healthcare Providers: https://www.woods-mathews.com/  This test  is not yet approved or cleared by the [place] and  has been authorized for detection and/or diagnosis of SARS-CoV-2 by FDA under an Emergency Use Authorization (EUA). This EUA will remain  in effect (meaning this test can be used) for the duration of the COVID-19 declaration under Se ction 564(b)(1) of the Act, 21 U.S.C. section 360bbb-3(b)(1), unless the authorization is terminated or revoked sooner.  Performed at [facility], [place] [address] [place], [place] [address]   Respiratory (~20 pathogens) panel by PCR     Status: None   Collection Time: 02/16/21  5:06 PM   Specimen: Nasopharyngeal Swab; Respiratory  Result Value Ref Range   Adenovirus NOT DETECTED NOT DETECTED   Coronavirus 229E NOT DETECTED NOT DETECTED    Comment: (NOTE) The Coronavirus on the Respiratory Panel, DOES NOT test for the novel  Coronavirus (2019 nCoV)    Coronavirus HKU1 NOT DETECTED NOT DETECTED   Coronavirus NL63 NOT DETECTED NOT DETECTED   Coronavirus OC43 NOT DETECTED NOT DETECTED   Metapneumovirus NOT DETECTED NOT DETECTED   Rhinovirus / Enterovirus NOT DETECTED NOT DETECTED   Influenza A NOT DETECTED NOT DETECTED   Influenza B NOT DETECTED NOT DETECTED    Parainfluenza Virus 1 NOT DETECTED NOT DETECTED   Parainfluenza Virus 2 NOT DETECTED NOT DETECTED   Parainfluenza Virus 3 NOT DETECTED NOT DETECTED   Parainfluenza Virus 4 NOT DETECTED NOT DETECTED   Respiratory Syncytial Virus NOT DETECTED NOT DETECTED   Bordetella pertussis NOT DETECTED NOT DETECTED   Bordetella Parapertussis NOT DETECTED NOT DETECTED   Chlamydophila pneumoniae NOT DETECTED NOT DETECTED   Mycoplasma pneumoniae NOT DETECTED NOT DETECTED    Comment: Performed at [facility], [place] [address], [place], [place] [address]  HIV Antibody (routine testing w rflx)     Status: None   Collection Time: 02/16/21  5:24 PM  Result Value Ref Range   HIV Screen 4th Generation wRfx Non Reactive Non Reactive    Comment: Performed at [facility], [place] [address], [place], [place] [address]  Hepatitis panel, acute     Status: None   Collection Time: 02/16/21  5:24 PM  Result Value Ref Range   Hepatitis B Surface Ag NON REACTIVE NON REACTIVE   HCV Ab NON REACTIVE NON REACTIVE    Comment: (NOTE) Nonreactive HCV antibody screen is consistent with no HCV infections,  unless recent infection is suspected or other evidence exists to indicate HCV infection.     Hep A IgM NON REACTIVE NON REACTIVE   Hep B C IgM NON REACTIVE NON REACTIVE    Comment: Performed at [facility], [place] [address], [place], [place] [address]  CBC     Status: Abnormal   Collection Time: 02/16/21  5:24 PM  Result Value Ref Range   WBC 3.3 (L) 4.0 - 10.5 K/uL   RBC 4.26 3.87 - 5.11 MIL/uL   Hemoglobin 12.7 12.0 - 15.0 g/dL   HCT 38.6 36.0 - 46.0 %   MCV 90.6 80.0 - 100.0 fL   MCH 29.8 26.0 - 34.0 pg   MCHC 32.9 30.0 - 36.0 g/dL   RDW 13.6 11.5 - 15.5 %   Platelets 229 150 - 400 K/uL   nRBC 0.0 0.0 - 0.2 %    Comment: Performed at [facility], [place] [address], [place],  [address]  Creatinine, serum     Status: Abnormal   Collection Time: 02/16/21  5:24 PM  Result Value Ref Range    Creatinine, Ser 10.45 (H) 0.44 - 1.00 mg/dL   GFR, Estimated 4 (L) >60 mL/min    Comment: (NOTE) Calculated using the CKD-EPI Creatinine Equation (2021) Performed at [facility] [address] [place], [place] [address]   Troponin I (High Sensitivity)     Status: Abnormal   Collection Time: 02/16/21  5:28 PM  Result Value Ref Range   Troponin I (High Sensitivity) 20 (H) <18 ng/L    Comment: (NOTE) Elevated high sensitivity troponin I (hsTnI) values and significant  changes across serial measurements may suggest ACS but many other  chronic and acute conditions are known to elevate hsTnI results.  Refer to the "Links" section for chest pain algorithms and additional  guidance. Performed at [facility], [place] [address], [place], [place] [address]   Urinalysis, Routine w reflex microscopic Urine, Catheterized     Status: Abnormal   Collection Time: 02/16/21  5:28 PM  Result Value Ref Range   Color, Urine AMBER (A) YELLOW    Comment: BIOCHEMICALS MAY BE AFFECTED BY COLOR   APPearance CLOUDY (A) CLEAR   Specific Gravity, Urine >1.046 (H) 1.005 - 1.030   pH 5.0 5.0 - 8.0   Glucose, UA NEGATIVE NEGATIVE mg/dL   Hgb urine dipstick NEGATIVE NEGATIVE   Bilirubin Urine NEGATIVE NEGATIVE   Ketones, ur NEGATIVE NEGATIVE mg/dL   Protein, ur >=300 (A) NEGATIVE mg/dL   Nitrite NEGATIVE NEGATIVE   Leukocytes,Ua NEGATIVE NEGATIVE   RBC / HPF 0-5 0 - 5 RBC/hpf   WBC, UA 0-5 0 - 5 WBC/hpf   Bacteria, UA RARE (A) NONE SEEN   Squamous Epithelial / LPF 0-5 0 - 5   Mucus PRESENT    Hyaline Casts, UA PRESENT    Amorphous Crystal PRESENT     Comment: Performed at [facility], [address], [place], [place] [address]  Sodium, urine, random     Status: None   Collection Time: 02/16/21  5:28 PM  Result Value Ref Range   Sodium, Ur 22 mmol/L    Comment: Performed at [facility] [address] [place], [place] [address]  Creatinine, urine, random     Status: None    Collection Time: 02/16/21  5:28 PM  Result Value Ref Range   Creatinine, Urine 534.73 mg/dL    Comment: RESULTS CONFIRMED BY MANUAL DILUTION Performed at [facility] [address] [place], [place] [address]   Protein / creatinine ratio, urine     Status: None   Collection Time: 02/16/21  5:28 PM  Result Value Ref Range   Creatinine, Urine 499.79 mg/dL    Comment: RESULTS CONFIRMED BY MANUAL DILUTION   Total Protein, Urine >3,000 mg/dL    Comment: RESULTS CONFIRMED BY MANUAL DILUTION   Protein Creatinine Ratio        0.00 - 0.15 mg/mg[Cre]    Comment: RESULT BELOW REPORTABLE RANGE, UNABLE TO CALCULATE. Performed at [facility], [place] [address] [place], [place] [address]   TSH     Status: None   Collection Time: 02/16/21  7:40 PM  Result Value Ref Range   TSH 1.836 0.350 - 4.500 uIU/mL    Comment: Performed by a 3rd Generation assay with a functional sensitivity of <=0.01 uIU/mL. Performed at [facility], [place] [address], [place], [place] [address]   Hemoglobin A1c     Status: Abnormal  Collection Time: 02/16/21  7:40 PM  Result Value Ref Range   Hgb A1c MFr Bld 6.3 (H) 4.8 - 5.6 %    Comment: (NOTE) Pre diabetes:          5.7%-6.4%  Diabetes:              >6.4%  Glycemic control for   <7.0% adults with diabetes    Mean Plasma Glucose 134.11 mg/dL    Comment: Performed at [facility], [address], [place], [place] [address]  Hepatic function panel     Status: Abnormal   Collection Time: 02/16/21  7:40 PM  Result Value Ref Range   Total Protein 5.1 (L) 6.5 - 8.1 g/dL   Albumin 1.5 (L) 3.5 - 5.0 g/dL   AST 725 (H) 15 - 41 U/L   ALT 62 (H) 0 - 44 U/L   Alkaline Phosphatase 69 38 - 126 U/L   Total Bilirubin 1.3 (H) 0.3 - 1.2 mg/dL   Bilirubin, Direct 0.1 0.0 - 0.2 mg/dL   Indirect Bilirubin 1.2 (H) 0.3 - 0.9 mg/dL    Comment: Performed at [facility], [address] [place], [place] [address]  Brain natriuretic peptide      Status: None   Collection Time: 02/17/21 12:47 AM  Result Value Ref Range   B Natriuretic Peptide 17.6 0.0 - 100.0 pg/mL    Comment: Performed at [facility], [address], [place], [place] [address]  CBC with Differential/Platelet     Status: Abnormal   Collection Time: 02/17/21 12:47 AM  Result Value Ref Range   WBC 3.2 (L) 4.0 - 10.5 K/uL   RBC 4.07 3.87 - 5.11 MIL/uL   Hemoglobin 11.8 (L) 12.0 - 15.0 g/dL   HCT 25.9 56.3 - 87.5 %   MCV 89.7 80.0 - 100.0 fL   MCH 29.0 26.0 - 34.0 pg   MCHC 32.3 30.0 - 36.0 g/dL   RDW 64.3 32.9 - 51.8 %   Platelets 214 150 - 400 K/uL   nRBC 0.0 0.0 - 0.2 %   Neutrophils Relative % 80 %   Neutro Abs 2.6 1.7 - 7.7 K/uL   Lymphocytes Relative 14 %   Lymphs Abs 0.4 (L) 0.7 - 4.0 K/uL   Monocytes Relative 6 %   Monocytes Absolute 0.2 0.1 - 1.0 K/uL   Eosinophils Relative 0 %   Eosinophils Absolute 0.0 0.0 - 0.5 K/uL   Basophils Relative 0 %   Basophils Absolute 0.0 0.0 - 0.1 K/uL   nRBC 1 (H) 0 /100 WBC   Abs Immature Granulocytes 0.00 0.00 - 0.07 K/uL    Comment: Performed at [facility], [address] [place], [place] [address]  Comprehensive metabolic panel     Status: Abnormal   Collection Time: 02/17/21 12:47 AM  Result Value Ref Range   Sodium 131 (L) 135 - 145 mmol/L   Potassium 4.1 3.5 - 5.1 mmol/L   Chloride 97 (L) 98 - 111 mmol/L   CO2 21 (L) 22 - 32 mmol/L   Glucose, Bld 105 (H) 70 - 99 mg/dL    Comment: Glucose reference range applies only to samples taken after fasting for at least 8 hours.   BUN 105 (H) 6 - 20 mg/dL   Creatinine, Ser 06.30 (H) 0.44 - 1.00 mg/dL   Calcium 7.4 (L) 8.9 - 10.3 mg/dL   Total Protein 5.1 (L) 6.5 - 8.1 g/dL   Albumin 1.5 (  L) 3.5 - 5.0 g/dL   AST 094 (H) 15 - 41 U/L   ALT 63 (H) 0 - 44 U/L   Alkaline Phosphatase 68 38 - 126 U/L   Total Bilirubin 1.3 (H) 0.3 - 1.2 mg/dL   GFR, Estimated 4 (L) >60 mL/min    Comment: (NOTE) Calculated using the CKD-EPI Creatinine Equation (2021)     Anion gap 13 5 - 15    Comment: Performed at [facility], [address] [place], [place] [address]  Magnesium     Status: None   Collection Time: 02/17/21 12:47 AM  Result Value Ref Range   Magnesium 2.3 1.7 - 2.4 mg/dL    Comment: Performed at [facility], [address] [place], [place] [address]  Protime-INR     Status: None   Collection Time: 02/17/21 12:47 AM  Result Value Ref Range   Prothrombin Time 12.9 11.4 - 15.2 seconds   INR 1.0 0.8 - 1.2    Comment: (NOTE) INR goal varies based on device and disease states. Performed at [facility], [address], [place], [place] [address]   CK     Status: Abnormal   Collection Time: 02/17/21  8:02 AM  Result Value Ref Range   Total CK 333 (H) 38 - 234 U/L    Comment: Performed at [facility], [address] [place], [place] [address]     ROS:  Pertinent items noted in HPI and remainder of comprehensive ROS otherwise negative.  Physical Exam: Vitals:   02/16/21 2335 02/17/21 0352  BP: 119/74 120/60  Pulse: 62 65  Resp: 19 16  Temp: 98.2 F (36.8 C) 98 F (36.7 C)  SpO2: 95% 90%     General exam: Appears calm and comfortable  Respiratory system: Some basal crackles, no wheezing or increased work of breathing. Cardiovascular system: S1 & S2 heard, RRR.  No rub.  No pedal edema. Gastrointestinal system: Abdomen is nondistended, soft and nontender. Normal bowel sounds heard. Central nervous system: Alert and oriented. No focal neurological deficits. Extremities: Symmetric 5 x 5 power. Skin: No rashes, lesions or ulcers Psychiatry: Judgement and insight appear normal. Mood & affect appropriate.   Assessment/Plan:  #Acute kidney injury: The acute elevation of serum creatinine level to 10 from normal creatinine about a month ago. The urine study with proteinuria around 6g, serum albumin 1.5 and trace edema points towards nephrotic syndrome. No microscopic hematuria or WBC. It is likely  minimal change dx with AKI vs collapsing FSGS (although HIV negative) vs membranous GN.   No evidence of AIN or TMA. Imaging studies ruled out obstruction. Hep B, C, HIV negative.  CK level not elevated.   Pending other serologies including ANA, ANCA, C3, C4, anti-GBM, ASO titer.  I will add protein electrophoresis and free light chain. Repeat UA and urine pcr (now she has foley catheter). Given mild SOB, I will discontinue IV fluid. Discussed with the patient and her family members that she may need dialysis in the next 1 or 2 days if no improvement in kidney function or if she develops any symptoms.   Also discussed about importance of kidney biopsy in order to diagnose the disease.  Discussed about the adverse effect of biopsy including bleeding, infection, loss of kidney function etc.  They agreed with the plan.  I consulted IR for kidney biopsy.  May also need HD catheter placement this weekend.  #Hypertension: Blood pressure acceptable.  Continue  to hold HCTZ.  #Exertional shortness of breath: Chest x-ray negative.  Pending echo.  Discontinue IV fluid.  May need diuretics.  Monitor closely.  #Sore throat: Reportedly she was treated with penicillin by her PCP.  Follow-up ASO titer.  Discussed with the primary team.  Maxie Barbron Prasad Bhandari 02/17/2021, 12:31 PM  [facility].

## 2021-02-18 ENCOUNTER — Encounter (HOSPITAL_COMMUNITY): Payer: Self-pay | Admitting: Internal Medicine

## 2021-02-18 ENCOUNTER — Inpatient Hospital Stay (HOSPITAL_COMMUNITY): Payer: BC Managed Care – PPO

## 2021-02-18 DIAGNOSIS — N179 Acute kidney failure, unspecified: Secondary | ICD-10-CM | POA: Diagnosis not present

## 2021-02-18 HISTORY — PX: IR US GUIDE VASC ACCESS RIGHT: IMG2390

## 2021-02-18 HISTORY — PX: IR FLUORO GUIDE CV LINE RIGHT: IMG2283

## 2021-02-18 LAB — CBC WITH DIFFERENTIAL/PLATELET
Abs Immature Granulocytes: 0.04 10*3/uL (ref 0.00–0.07)
Basophils Absolute: 0 10*3/uL (ref 0.0–0.1)
Basophils Relative: 0 %
Eosinophils Absolute: 0 10*3/uL (ref 0.0–0.5)
Eosinophils Relative: 0 %
HCT: 32.2 % — ABNORMAL LOW (ref 36.0–46.0)
Hemoglobin: 10.8 g/dL — ABNORMAL LOW (ref 12.0–15.0)
Immature Granulocytes: 1 %
Lymphocytes Relative: 35 %
Lymphs Abs: 1.1 10*3/uL (ref 0.7–4.0)
MCH: 29.7 pg (ref 26.0–34.0)
MCHC: 33.5 g/dL (ref 30.0–36.0)
MCV: 88.5 fL (ref 80.0–100.0)
Monocytes Absolute: 0.3 10*3/uL (ref 0.1–1.0)
Monocytes Relative: 11 %
Neutro Abs: 1.6 10*3/uL — ABNORMAL LOW (ref 1.7–7.7)
Neutrophils Relative %: 53 %
Platelets: 190 10*3/uL (ref 150–400)
RBC: 3.64 MIL/uL — ABNORMAL LOW (ref 3.87–5.11)
RDW: 13.6 % (ref 11.5–15.5)
WBC: 3.1 10*3/uL — ABNORMAL LOW (ref 4.0–10.5)
nRBC: 0 % (ref 0.0–0.2)

## 2021-02-18 LAB — MPO/PR-3 (ANCA) ANTIBODIES
ANCA Proteinase 3: <3.5 U/mL (ref 0.0–3.5)
Myeloperoxidase Abs: <9 U/mL (ref 0.0–9.0)

## 2021-02-18 LAB — COMPREHENSIVE METABOLIC PANEL
ALT: 65 U/L — ABNORMAL HIGH (ref 0–44)
AST: 132 U/L — ABNORMAL HIGH (ref 15–41)
Albumin: 1.4 g/dL — ABNORMAL LOW (ref 3.5–5.0)
Alkaline Phosphatase: 69 U/L (ref 38–126)
Anion gap: 14 (ref 5–15)
BUN: 114 mg/dL — ABNORMAL HIGH (ref 6–20)
CO2: 18 mmol/L — ABNORMAL LOW (ref 22–32)
Calcium: 7.1 mg/dL — ABNORMAL LOW (ref 8.9–10.3)
Chloride: 99 mmol/L (ref 98–111)
Creatinine, Ser: 11.93 mg/dL — ABNORMAL HIGH (ref 0.44–1.00)
GFR, Estimated: 3 mL/min — ABNORMAL LOW (ref >60)
Glucose, Bld: 99 mg/dL (ref 70–99)
Potassium: 4.1 mmol/L (ref 3.5–5.1)
Sodium: 131 mmol/L — ABNORMAL LOW (ref 135–145)
Total Bilirubin: 1.1 mg/dL (ref 0.3–1.2)
Total Protein: 4.8 g/dL — ABNORMAL LOW (ref 6.5–8.1)

## 2021-02-18 LAB — CBC
HCT: 35.7 % — ABNORMAL LOW (ref 36.0–46.0)
Hemoglobin: 12.1 g/dL (ref 12.0–15.0)
MCH: 30 pg (ref 26.0–34.0)
MCHC: 33.9 g/dL (ref 30.0–36.0)
MCV: 88.4 fL (ref 80.0–100.0)
Platelets: 254 10*3/uL (ref 150–400)
RBC: 4.04 MIL/uL (ref 3.87–5.11)
RDW: 13.8 % (ref 11.5–15.5)
WBC: 3.3 10*3/uL — ABNORMAL LOW (ref 4.0–10.5)
nRBC: 0 % (ref 0.0–0.2)

## 2021-02-18 LAB — RENAL FUNCTION PANEL
Albumin: 1.5 g/dL — ABNORMAL LOW (ref 3.5–5.0)
Anion gap: 16 — ABNORMAL HIGH (ref 5–15)
BUN: 119 mg/dL — ABNORMAL HIGH (ref 6–20)
CO2: 18 mmol/L — ABNORMAL LOW (ref 22–32)
Calcium: 7.2 mg/dL — ABNORMAL LOW (ref 8.9–10.3)
Chloride: 98 mmol/L (ref 98–111)
Creatinine, Ser: 12.28 mg/dL — ABNORMAL HIGH (ref 0.44–1.00)
GFR, Estimated: 3 mL/min — ABNORMAL LOW (ref >60)
Glucose, Bld: 103 mg/dL — ABNORMAL HIGH (ref 70–99)
Phosphorus: 9 mg/dL — ABNORMAL HIGH (ref 2.5–4.6)
Potassium: 4.8 mmol/L (ref 3.5–5.1)
Sodium: 132 mmol/L — ABNORMAL LOW (ref 135–145)

## 2021-02-18 LAB — BRAIN NATRIURETIC PEPTIDE: B Natriuretic Peptide: 13.6 pg/mL (ref 0.0–100.0)

## 2021-02-18 LAB — STREP PNEUMONIAE URINARY ANTIGEN: Strep Pneumo Urinary Antigen: NEGATIVE

## 2021-02-18 LAB — MAGNESIUM: Magnesium: 2.3 mg/dL (ref 1.7–2.4)

## 2021-02-18 MED ORDER — PENTAFLUOROPROP-TETRAFLUOROETH EX AERO: 1.0000 "application " | INHALATION_SPRAY | CUTANEOUS | Status: DC | PRN

## 2021-02-18 MED ORDER — FAMOTIDINE 20 MG PO TABS
10.0000 mg | ORAL_TABLET | Freq: Every day | ORAL | Status: DC
  Administered 2021-02-18 – 2021-03-01 (×11): 10 mg via ORAL
  Filled 2021-02-18 (×11): qty 1

## 2021-02-18 MED ORDER — ALTEPLASE 2 MG IJ SOLR: 2.0000 mg | Freq: Once | INTRAMUSCULAR | Status: DC | PRN

## 2021-02-18 MED ORDER — MIDAZOLAM HCL 2 MG/2ML IJ SOLN
INTRAMUSCULAR | Status: AC
  Filled 2021-02-18: qty 2

## 2021-02-18 MED ORDER — HEPARIN SODIUM (PORCINE) 1000 UNIT/ML IJ SOLN
INTRAMUSCULAR | Status: AC
  Filled 2021-02-18: qty 1

## 2021-02-18 MED ORDER — LIDOCAINE HCL 1 % IJ SOLN
INTRAMUSCULAR | Status: AC | PRN
Start: 2021-02-18 — End: 2021-02-18
  Administered 2021-02-18: 5 mL

## 2021-02-18 MED ORDER — CHLORHEXIDINE GLUCONATE CLOTH 2 % EX PADS
6.0000 | MEDICATED_PAD | Freq: Every day | CUTANEOUS | Status: DC
  Administered 2021-02-18 – 2021-02-19 (×2): 6 via TOPICAL

## 2021-02-18 MED ORDER — SODIUM CHLORIDE 0.9 % IV SOLN: 100.0000 mL | INTRAVENOUS | Status: DC | PRN

## 2021-02-18 MED ORDER — LIDOCAINE HCL (PF) 1 % IJ SOLN: 5.0000 mL | INTRAMUSCULAR | Status: DC | PRN

## 2021-02-18 MED ORDER — HEPARIN SODIUM (PORCINE) 1000 UNIT/ML DIALYSIS
1000.0000 [IU] | INTRAMUSCULAR | Status: DC | PRN
  Administered 2021-02-18: 3200 [IU] via INTRAVENOUS_CENTRAL

## 2021-02-18 MED ORDER — LIDOCAINE-PRILOCAINE 2.5-2.5 % EX CREA: 1.0000 "application " | TOPICAL_CREAM | CUTANEOUS | Status: DC | PRN

## 2021-02-18 MED ORDER — SODIUM CHLORIDE 0.9 % IV SOLN
250.0000 mg | Freq: Every day | INTRAVENOUS | Status: AC
  Administered 2021-02-18 – 2021-02-20 (×3): 250 mg via INTRAVENOUS
  Filled 2021-02-18 (×3): qty 2

## 2021-02-18 MED ORDER — TRAMADOL HCL 50 MG PO TABS
50.0000 mg | ORAL_TABLET | Freq: Once | ORAL | Status: AC
  Administered 2021-02-18: 50 mg via ORAL
  Filled 2021-02-18: qty 1

## 2021-02-18 MED ORDER — FENTANYL CITRATE (PF) 100 MCG/2ML IJ SOLN
INTRAMUSCULAR | Status: AC
  Filled 2021-02-18: qty 2

## 2021-02-18 MED ORDER — FENTANYL CITRATE (PF) 100 MCG/2ML IJ SOLN
INTRAMUSCULAR | Status: AC | PRN
  Administered 2021-02-18: 50 ug via INTRAVENOUS

## 2021-02-18 MED ORDER — HEPARIN SODIUM (PORCINE) 1000 UNIT/ML IJ SOLN
INTRAMUSCULAR | Status: AC
  Filled 2021-02-18: qty 4

## 2021-02-18 MED ORDER — LIDOCAINE HCL 1 % IJ SOLN
INTRAMUSCULAR | Status: AC
  Filled 2021-02-18: qty 20

## 2021-02-18 MED ORDER — ALUM & MAG HYDROXIDE-SIMETH 200-200-20 MG/5ML PO SUSP
15.0000 mL | Freq: Four times a day (QID) | ORAL | Status: DC | PRN
  Administered 2021-02-18 – 2021-02-28 (×3): 15 mL via ORAL
  Filled 2021-02-18 (×3): qty 30

## 2021-02-18 MED ORDER — MIDAZOLAM HCL 2 MG/2ML IJ SOLN
INTRAMUSCULAR | Status: AC | PRN
  Administered 2021-02-18: 1 mg via INTRAVENOUS

## 2021-02-18 NOTE — Progress Notes (Signed)
PROGRESS NOTE                                                                                                                                                                                                             Patient Demographics:    Denise Watson, is a 55 y.o. female, DOB - 1966-03-23, BJS:[id number]  Outpatient Primary MD for the patient is Piedad Climes, [place]    LOS - 2  Admit date - 02/16/2021    Chief Complaint  Patient presents with  . Shortness of Breath       Brief Narrative (HPI from H&P) -  Denise Watson  is a 55 y.o. female, who is in relatively good health except recent diagnosis of essential hypertension requiring HCTZ for the last few weeks, obesity, COVID-19 infection in January, psoriasis, C-section in the past, intermittent palpitations in the past.  Apparently patient has been having symptoms of sore throat for the last 2 weeks and subsequently has been developing gradual fatigue and some exertional shortness of breath, denies any fever chills, no chest or abdominal pain, no blood in stool or urine, no cough or sinus pressure, no swelling in arms or legs, she does have petechial rash but she says this is due to her psoriasis and is chronic.  She presented to the ER where she was found to have AKI with a creatinine of 10, nephrology was consulted and I was requested to admit the patient.  Currently review of systems positive for sore throat, fatigue, exertional shortness of breath, chronic petechial rash due to psoriasis according to the patient.  Other review of systems are negative   Subjective:    Patient in bed, appears comfortable, denies any headache, no fever, no chest pain or pressure, no shortness of breath , no abdominal pain. No focal weakness.    Assessment  & Plan :      1.  Generalized fatigue, exertional shortness of breath and pharyngitis.  In a patient with AKI, she was  recently diagnosed with hypertension and started with HCTZ as well.  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   2.  Petechial rash.  Patient claims this is chronic due to psoriasis but could be due to platelet dysfunction caused by AKI.  Will monitor.  Platelet counts are stable pressure stable.  3.  Borderline hyperkalemia.  1 dose Kayexalate and monitor.  4.  Exertional shortness of breath, likely due to AKI, stable TTE. HD now for any extra fluid removal.    5.  Obesity.  BMI 39.  Follow with PCP.  6. Hypertension.  Gentle IV hydralazine, will avoid hypotension due to AKI.  7.  Pharyngitis.  See #1 above.  8.  History of psoriasis.  Question if she has discoid lupus.  Outpatient dermatology follow-up       Condition -   Guarded  Family Communication  : [name] [phone]  02/17/2021  Code Status :  Full  Consults  :  Renal  PUD Prophylaxis :    Procedures  :     Renal US - medical renal disease.  TTE - 1. Left ventricular ejection fraction, by estimation, is 60%. The left ventricle has normal function. The left ventricle has no regional wall motion abnormalities. There is mild left ventricular hypertrophy. Left ventricular diastolic parameters were low normal.  2. Right ventricular systolic function is normal. The right ventricular size is normal. Tricuspid regurgitation signal is inadequate for assessing PA pressure.  3. The mitral valve is normal in structure. Trivial mitral valve regurgitation. No evidence of mitral stenosis.  4. The aortic valve is tricuspid.  Aortic valve regurgitation is not visualized. No aortic stenosis is present.  5. The inferior vena cava is normal in size with greater than 50% respiratory variability, suggesting right atrial pressure of 3 mmHg.       Disposition Plan  :    Status is: Inpatient  Remains inpatient appropriate because:IV treatments appropriate due to intensity of illness or inability to take PO   Dispo: The patient is from: Home              Anticipated d/c is to: Home              Patient currently is not medically stable to d/c.   Difficult to place patient No  DVT Prophylaxis  :  Heparin   Lab Results  Component Value Date   PLT 190 02/18/2021    Diet :  Diet Order            Diet NPO time specified Except for: Sips with Meds  Diet effective midnight           Diet renal with fluid restriction Fluid restriction: 1200 mL Fluid; Room service appropriate? Yes; Fluid consistency: Thin  Diet effective now                  Inpatient Medications  Scheduled Meds: . Chlorhexidine Gluconate Cloth  6 each Topical Q0600  . famotidine  10 mg Oral Daily  . heparin  5,000 Units Subcutaneous Q8H   Continuous Infusions: . methylPREDNISolone (SOLU-MEDROL) injection     PRN Meds:.acetaminophen **OR** [DISCONTINUED] acetaminophen, bisacodyl, hydrALAZINE, [DISCONTINUED] ondansetron **OR** ondansetron (ZOFRAN) IV, phenol  Antibiotics  :    Anti-infectives (From admission, onward)   None       Time Spent in minutes  30   Susa Raring M.D on 02/18/2021 at 9:03 AM  To page go to [email or web address]   [facility] -  Office  [phone]   See all Orders from today for further details    Objective:   Vitals:   02/17/21 0352 02/17/21 1422 02/17/21 2100 02/18/21 0521  BP: 120/60 129/62 124/62 131/75  Pulse: 65 70 69 61  Resp: Temp: 98 F (36.7 C) 99 F (37.2 C) 97.7 F (36.5 C) 97.8 F (36.6 C)  TempSrc: Oral Oral Oral Axillary  SpO2: 90% 98% 94% 97%  Weight:       Height:        Wt Readings from Last 3 Encounters:  02/16/21 108.9 kg  01/27/21 108.9 kg  06/27/16 132 kg     Intake/Output Summary (Last 24 hours) at 02/18/2021 0903 Last data filed at 02/18/2021 0536 Gross per 24 hour  Intake 980.42 ml  Output 90 ml  Net 890.42 ml     Physical Exam  Awake Alert, No new F.N deficits, Normal affect [place].AT,PERRAL, does have pharyngitis on exam along with petechial rash and discoid rash, few crackles, Supple Neck,No JVD, No cervical lymphadenopathy appriciated.  Symmetrical Chest wall movement, Good air movement bilaterally,   RRR,No Gallops, Rubs or new Murmurs, No Parasternal Heave +ve B.Sounds, Abd Soft, No tenderness, No organomegaly appriciated, No rebound - guarding or rigidity. No Cyanosis, Clubbing or edema,     Data Review:    CBC Recent Labs  Lab 02/16/21 1240 02/16/21 1724 02/17/21 0047 02/18/21 0230  WBC 3.5* 3.3* 3.2* 3.1*  HGB 12.1 12.7 11.8* 10.8*  HCT 38.2 38.6 36.5 32.2*  PLT 231 229 214 190  MCV 91.0 90.6 89.7 88.5  MCH 28.8 29.8 29.0 29.7  MCHC 31.7 32.9 32.3 33.5  RDW 13.6 13.6 13.4 13.6  LYMPHSABS  --   --  0.4* 1.1  MONOABS  --   --  0.2 0.3  EOSABS  --   --  0.0 0.0  BASOSABS  --   --  0.0 0.0    Recent Labs  Lab 02/16/21 1240 02/16/21 1724 02/16/21 1940 02/17/21 0047 02/18/21 0230  NA 132*  --   --  131* 131*  K 5.1  --   --  4.1 4.1  CL 98  --   --  97* 99  CO2 20*  --   --  21* 18*  GLUCOSE 102*  --   --  105* 99  BUN 103*  --   --  105* 114*  CREATININE 10.18* 10.45*  --  10.53* 11.93*  CALCIUM 7.7*  --   --  7.4* 7.1*  AST  --   --  123* 127* 132*  ALT  --   --  62* 63* 65*  ALKPHOS  --   --  69 68 69  BILITOT  --   --  1.3* 1.3* 1.1  ALBUMIN  --   --  1.5* 1.5* 1.4*  MG  --   --   --  2.3 2.3  INR  --   --   --  1.0  --   TSH  --   --  1.836  --   --   HGBA1C  --   --  6.3*  --   --   BNP  --   --   --  17.6 13.6     ------------------------------------------------------------------------------------------------------------------ No results for input(s): CHOL, HDL, LDLCALC, TRIG, CHOLHDL, LDLDIRECT in the last 72 hours.  Lab Results  Component Value Date   HGBA1C 6.3 (H) 02/16/2021   ------------------------------------------------------------------------------------------------------------------ Recent Labs  02/16/21 1940  TSH 1.836    Cardiac Enzymes No results for input(s): CKMB, TROPONINI, MYOGLOBIN in the last 168 hours.  Invalid input(s): CK ------------------------------------------------------------------------------------------------------------------    Component Value Date/Time   BNP 13.6 02/18/2021 0230    Micro Results Recent Results (from the past 240 hour(s))  SARS CORONAVIRUS 2 (TAT 6-24 HRS) Nasopharyngeal Nasopharyngeal Swab     Status: None   Collection Time: 02/16/21  4:25 PM   Specimen: Nasopharyngeal Swab  Result Value Ref Range Status   SARS Coronavirus 2 NEGATIVE NEGATIVE Final    Comment: (NOTE) SARS-CoV-2 target nucleic acids are NOT DETECTED.  The SARS-CoV-2 RNA is generally detectable in upper and lower respiratory specimens during the acute phase of infection. Negative results do not preclude SARS-CoV-2 infection, do not rule out co-infections with other pathogens, and should not be used as the sole basis for treatment or other patient management decisions. Negative results must be combined with clinical observations, patient history, and epidemiological information. The expected result is Negative.  Fact Sheet for Patients: HairSlick.no  Fact Sheet for Healthcare Providers: quierodirigir.com  This test is not yet approved or cleared by the [place] FDA and  has been authorized for detection and/or diagnosis of SARS-CoV-2 by FDA under an Emergency Use Authorization (EUA). This EUA will  remain  in effect (meaning this test can be used) for the duration of the COVID-19 declaration under Se ction 564(b)(1) of the Act, 21 U.S.C. section 360bbb-3(b)(1), unless the authorization is terminated or revoked sooner.  Performed at [facility], [address], [place], [place] [address]   Respiratory (~20 pathogens) panel by PCR     Status: None   Collection Time: 02/16/21  5:06 PM   Specimen: Nasopharyngeal Swab; Respiratory  Result Value Ref Range Status   Adenovirus NOT DETECTED NOT DETECTED Final   Coronavirus 229E NOT DETECTED NOT DETECTED Final    Comment: (NOTE) The Coronavirus on the Respiratory Panel, DOES NOT test for the novel  Coronavirus (2019 nCoV)    Coronavirus HKU1 NOT DETECTED NOT DETECTED Final   Coronavirus NL63 NOT DETECTED NOT DETECTED Final   Coronavirus OC43 NOT DETECTED NOT DETECTED Final   Metapneumovirus NOT DETECTED NOT DETECTED Final   Rhinovirus / Enterovirus NOT DETECTED NOT DETECTED Final   Influenza A NOT DETECTED NOT DETECTED Final   Influenza B NOT DETECTED NOT DETECTED Final   Parainfluenza Virus 1 NOT DETECTED NOT DETECTED Final   Parainfluenza Virus 2 NOT DETECTED NOT DETECTED Final   Parainfluenza Virus 3 NOT DETECTED NOT DETECTED Final   Parainfluenza Virus 4 NOT DETECTED NOT DETECTED Final   Respiratory Syncytial Virus NOT DETECTED NOT DETECTED Final   Bordetella pertussis NOT DETECTED NOT DETECTED Final   Bordetella Parapertussis NOT DETECTED NOT DETECTED Final   Chlamydophila pneumoniae NOT DETECTED NOT DETECTED Final   Mycoplasma pneumoniae NOT DETECTED NOT DETECTED Final    Comment: Performed at [facility], [address] [place], [place] [address]    Radiology Reports CT ABDOMEN PELVIS WO CONTRAST  Result Date: 02/16/2021 CLINICAL DATA:  Abdominal pain. EXAM: CT ABDOMEN AND PELVIS WITHOUT CONTRAST TECHNIQUE: Multidetector CT imaging of the abdomen and pelvis was performed following the standard protocol  without IV contrast. COMPARISON:  None. FINDINGS: Lower chest: No acute abnormality. Hepatobiliary: No focal liver abnormality is seen. No gallstones, gallbladder wall thickening, or biliary dilatation. Pancreas: Unremarkable. No pancreatic ductal dilatation or surrounding inflammatory changes. Spleen: Normal in size without focal abnormality. Adrenals/Urinary  Tract: Adrenal glands appear normal. No hydronephrosis or renal obstruction is noted. No renal or ureteral calculi are noted. Urinary bladder is decompressed secondary to Foley catheter. Minimal inflammatory changes are noted around the right kidney suggesting possible pyelonephritis. Stomach/Bowel: Stomach is within normal limits. Appendix appears normal. No evidence of bowel wall thickening, distention, or inflammatory changes. Vascular/Lymphatic: No significant vascular findings are present. No enlarged abdominal or pelvic lymph nodes. Reproductive: No adnexal abnormality is noted. Probable small exophytic uterine fibroid is noted arising posteriorly from the uterus. Other: No abdominal wall hernia or abnormality. No abdominopelvic ascites. Musculoskeletal: No acute or significant osseous findings. IMPRESSION: 1. Minimal inflammatory changes are noted around the right kidney suggesting possible pyelonephritis. 2. No hydronephrosis or renal obstruction is noted. No renal or ureteral calculi are noted. 3. Probable small exophytic uterine fibroid is noted arising posteriorly from the uterus. Electronically Signed   By: Lupita Raider M.D.   On: 02/16/2021 18:32   CT Angio Head W or Wo Contrast  Result Date: 01/27/2021 CLINICAL DATA:  Diplopia.  Dural sinus thrombosis suspected. EXAM: CT ANGIOGRAPHY HEAD CT VENOGRAM HEAD TECHNIQUE: Multidetector CT imaging of the head was performed using the standard protocol during bolus administration of intravenous contrast. Multiplanar CT image reconstructions and MIPs were obtained to evaluate the arterial anatomy.  Additional delayed multidetector CT imaging of the head was performed with multiplanar CT image reconstructions and MIPS to evaluate the venous anatomy. CONTRAST:  OMNIPAQUE IOHEXOL 350 MG/ML SOLN COMPARISON:  None. FINDINGS: CT HEAD Brain: No evidence of acute large vascular territory infarction, acute hemorrhage, hydrocephalus, extra-axial collection or mass lesion/mass effect. Mild narrowing of the mamillopontine distance and effacement of the suprasellar cistern with trace tonsillar ectopia. Skull: No acute fracture. Sinuses: Visualized sinuses are clear. Orbits: No acute finding. CTA HEAD Due to venous timing, significantly limited evaluation. The intradural left vertebral artery is poorly opacified, which could relate to stenosis or non dominant vertebral artery. Otherwise, no evidence of proximal large vessel occlusion or large aneurysm in the anterior or posterior circulation. CT VENOGRAM No evidence of dural sinus thrombosis. Small left transverse sinus and narrowing of the distal right transverse sinus. Small rounded filling defect in the distal right transverse sinus likely represents arachnoid granulation. IMPRESSION: CT head: 1. No definite evidence of acute intracranial abnormality. 2. Mild narrowing of the mamillopontine distance and effacement of the suprasellar cistern with trace tonsillar ectopia. Given the patient is young and has relative paucity of CSF spaces/sulci diffusely these findings may be within normal limits, but recommend correlation with signs/symptoms of intracranial hypotension including positional headaches. Additionally, per Dr. Rush Landmark the patient is going to get a follow-up MRI which can further evaluate these findings. CTA head: Due to venous timing, significantly limited evaluation. The intradural left vertebral artery is not well opacified, which is poorly evaluated and could relate to nondominant vertebral artery or stenosis. A CTA neck could further evaluate if  clinically indicated. Otherwise, no evidence of proximal large vessel occlusion or large aneurysm. A CTA of the neck could further evaluate the vertebral finding and a repeat CTA head could provide more sensitive intracranial arterial evaluation if clinically indicated. CTV: No evidence of dural sinus thrombosis. Findings discussed with Dr. Julieanne Manson at 3:05 PM via telephone. Electronically Signed   By: Feliberto Harts MD   On: 01/27/2021 15:15   DG Chest 2 View  Result Date: 02/16/2021 CLINICAL DATA:  Shortness of breath. EXAM: CHEST - 2 VIEW COMPARISON:  January 27, 2021.  FINDINGS: The heart size and mediastinal contours are within normal limits. Both lungs are clear. No pneumothorax or pleural effusion is noted. The visualized skeletal structures are unremarkable. IMPRESSION: No active cardiopulmonary disease. Electronically Signed   By: Lupita Raider M.D.   On: 02/16/2021 12:53   US Renal  Result Date: 02/16/2021 CLINICAL DATA:  Renal failure EXAM: RENAL / URINARY TRACT ULTRASOUND COMPLETE COMPARISON:  CT 02/16/2021 FINDINGS: Right Kidney: Renal measurements: 11.9 x 5.8 x 4.4 cm = volume: 159.2 mL. Cortex is echogenic. No mass or hydronephrosis. Trace perinephric fluid Left Kidney: Renal measurements: 9.7 x 5.2 x 4.9 cm = volume: 129.5 mL. Cortex is echogenic. No mass or hydronephrosis. Bladder: Empty by Foley catheter. Other: None. IMPRESSION: Echogenic kidneys bilaterally consistent with medical renal disease. No hydronephrosis Electronically Signed   By: Jasmine Pang M.D.   On: 02/16/2021 20:23   DG Chest Portable 1 View  Result Date: 01/27/2021 CLINICAL DATA:  Persistent cough. Recurrent upper respiratory infection EXAM: PORTABLE CHEST 1 VIEW COMPARISON:  Radiograph 06/27/2016 FINDINGS: 1355 hours. The heart size and mediastinal contours are normal. The lungs are clear. There is no pleural effusion or pneumothorax. No acute osseous findings are identified. Telemetry leads overlie the chest.  IMPRESSION: No active cardiopulmonary process. Electronically Signed   By: Carey Bullocks M.D.   On: 01/27/2021 14:47   ECHOCARDIOGRAM COMPLETE  Result Date: 02/17/2021    ECHOCARDIOGRAM REPORT   Patient Name:   Denise Watson Date of Exam: 02/17/2021 Medical Rec #:  [id number]       Height:       65.0 in Accession #:    [id number]      Weight:       240.0 lb Date of Birth:  Nov 18, 1966        BSA:          2.138 m Patient Age:    54 years        BP:           120/60 mmHg Patient Gender: F               HR:           67 bpm. Exam Location:  Inpatient Procedure: 2D Echo Indications:    CHF-Acute Diastolic I50.31  History:        Patient has no prior history of Echocardiogram examinations.  Sonographer:    Thurman Coyer RDCS (AE) Referring Phys: [id number] [facility] IMPRESSIONS  1. Left ventricular ejection fraction, by estimation, is 60%. The left ventricle has normal function. The left ventricle has no regional wall motion abnormalities. There is mild left ventricular hypertrophy. Left ventricular diastolic parameters were low normal.  2. Right ventricular systolic function is normal. The right ventricular size is normal. Tricuspid regurgitation signal is inadequate for assessing PA pressure.  3. The mitral valve is normal in structure. Trivial mitral valve regurgitation. No evidence of mitral stenosis.  4. The aortic valve is tricuspid. Aortic valve regurgitation is not visualized. No aortic stenosis is present.  5. The inferior vena cava is normal in size with greater than 50% respiratory variability, suggesting right atrial pressure of 3 mmHg. FINDINGS  Left Ventricle: Left ventricular ejection fraction, by estimation, is 60%. The left ventricle has normal function. The left ventricle has no regional wall motion abnormalities. The left ventricular internal cavity size was normal in size. There is mild left ventricular hypertrophy. Left ventricular diastolic parameters were normal. Right Ventricle: The  right ventricular  size is normal. No increase in right ventricular wall thickness. Right ventricular systolic function is normal. Tricuspid regurgitation signal is inadequate for assessing PA pressure. Left Atrium: Left atrial size was normal in size. Right Atrium: Right atrial size was normal in size. Pericardium: There is no evidence of pericardial effusion. Mitral Valve: The mitral valve is normal in structure. Trivial mitral valve regurgitation. No evidence of mitral valve stenosis. Tricuspid Valve: The tricuspid valve is normal in structure. Tricuspid valve regurgitation is trivial. No evidence of tricuspid stenosis. Aortic Valve: The aortic valve is tricuspid. Aortic valve regurgitation is not visualized. No aortic stenosis is present. Pulmonic Valve: The pulmonic valve was normal in structure. Pulmonic valve regurgitation is trivial. No evidence of pulmonic stenosis. Aorta: The aortic root is normal in size and structure. Venous: The inferior vena cava is normal in size with greater than 50% respiratory variability, suggesting right atrial pressure of 3 mmHg. IAS/Shunts: The interatrial septum was not well visualized.  LEFT VENTRICLE PLAX 2D LVIDd:         4.10 cm  Diastology LVIDs:         2.80 cm  LV e' medial:    6.53 cm/s LV PW:         1.20 cm  LV E/e' medial:  11.0 LV IVS:        1.20 cm  LV e' lateral:   9.57 cm/s LVOT diam:     2.10 cm  LV E/e' lateral: 7.5 LV SV:         76 LV SV Index:   36 LVOT Area:     3.46 cm  RIGHT VENTRICLE RV S prime:     15.40 cm/s TAPSE (M-mode): 1.4 cm LEFT ATRIUM             Index       RIGHT ATRIUM          Index LA diam:        3.10 cm 1.45 cm/m  RA Area:     9.08 cm LA Vol (A2C):   27.6 ml 12.91 ml/m RA Volume:   15.80 ml 7.39 ml/m LA Vol (A4C):   39.6 ml 18.53 ml/m LA Biplane Vol: 35.3 ml 16.51 ml/m  AORTIC VALVE LVOT Vmax:   107.00 cm/s LVOT Vmean:  64.800 cm/s LVOT VTI:    0.220 m  AORTA Ao Root diam: 2.80 cm MITRAL VALVE MV Area (PHT): 2.42 cm    SHUNTS MV  Decel Time: 313 msec    Systemic VTI:  0.22 m MV E velocity: 71.60 cm/s  Systemic Diam: 2.10 cm MV A velocity: 73.30 cm/s MV E/A ratio:  0.98 Weston BrassGayatri Acharya MD Electronically signed by Weston BrassGayatri Acharya MD Signature Date/Time: 02/17/2021/1:13:33 PM    Final    CT VENOGRAM HEAD  Result Date: 01/27/2021 CLINICAL DATA:  Diplopia.  Dural sinus thrombosis suspected. EXAM: CT ANGIOGRAPHY HEAD CT VENOGRAM HEAD TECHNIQUE: Multidetector CT imaging of the head was performed using the standard protocol during bolus administration of intravenous contrast. Multiplanar CT image reconstructions and MIPs were obtained to evaluate the arterial anatomy. Additional delayed multidetector CT imaging of the head was performed with multiplanar CT image reconstructions and MIPS to evaluate the venous anatomy. CONTRAST:  100mL OMNIPAQUE IOHEXOL 350 MG/ML SOLN COMPARISON:  None. FINDINGS: CT HEAD Brain: No evidence of acute large vascular territory infarction, acute hemorrhage, hydrocephalus, extra-axial collection or mass lesion/mass effect. Mild narrowing of the mamillopontine distance and effacement of the suprasellar cistern with trace tonsillar  ectopia. Skull: No acute fracture. Sinuses: Visualized sinuses are clear. Orbits: No acute finding. CTA HEAD Due to venous timing, significantly limited evaluation. The intradural left vertebral artery is poorly opacified, which could relate to stenosis or non dominant vertebral artery. Otherwise, no evidence of proximal large vessel occlusion or large aneurysm in the anterior or posterior circulation. CT VENOGRAM No evidence of dural sinus thrombosis. Small left transverse sinus and narrowing of the distal right transverse sinus. Small rounded filling defect in the distal right transverse sinus likely represents arachnoid granulation. IMPRESSION: CT head: 1. No definite evidence of acute intracranial abnormality. 2. Mild narrowing of the mamillopontine distance and effacement of the  suprasellar cistern with trace tonsillar ectopia. Given the patient is young and has relative paucity of CSF spaces/sulci diffusely these findings may be within normal limits, but recommend correlation with signs/symptoms of intracranial hypotension including positional headaches. Additionally, per Dr. Rush Landmark the patient is going to get a follow-up MRI which can further evaluate these findings. CTA head: Due to venous timing, significantly limited evaluation. The intradural left vertebral artery is not well opacified, which is poorly evaluated and could relate to nondominant vertebral artery or stenosis. A CTA neck could further evaluate if clinically indicated. Otherwise, no evidence of proximal large vessel occlusion or large aneurysm. A CTA of the neck could further evaluate the vertebral finding and a repeat CTA head could provide more sensitive intracranial arterial evaluation if clinically indicated. CTV: No evidence of dural sinus thrombosis. Findings discussed with Dr. Julieanne Manson at 3:05 PM via telephone. Electronically Signed   By: Feliberto Harts MD   On: 01/27/2021 15:15

## 2021-02-18 NOTE — Procedures (Signed)
Interventional Radiology Procedure Note  Procedure: Placement of a right IJ approach temp HD cath, 16cm.  Tip is positioned at the superior cavoatrial junction and catheter is ready for immediate use.  Complications: None Recommendations:  - Ok to use - OK to convert if need be - Do not submerge - Routine line care   Signed,  Yvone Neu. Loreta Ave, DO

## 2021-02-18 NOTE — Plan of Care (Signed)
Patient is currently resting in bed. C/o back pain, given tylenol and one time dose of tramadol. OOB ambulating to chair. Foley intact. VSS. Remains on room air. Call bell within reach.   Problem: Clinical Measurements: Goal: Ability to maintain clinical measurements within normal limits will improve Outcome: Progressing Goal: Will remain free from infection Outcome: Progressing Goal: Diagnostic test results will improve Outcome: Progressing Goal: Respiratory complications will improve Outcome: Progressing Goal: Cardiovascular complication will be avoided Outcome: Progressing   Problem: Activity: Goal: Risk for activity intolerance will decrease Outcome: Progressing   Problem: Nutrition: Goal: Adequate nutrition will be maintained Outcome: Progressing   Problem: Coping: Goal: Level of anxiety will decrease Outcome: Progressing   Problem: Elimination: Goal: Will not experience complications related to bowel motility Outcome: Progressing Goal: Will not experience complications related to urinary retention Outcome: Progressing   Problem: Pain Managment: Goal: General experience of comfort will improve Outcome: Progressing   Problem: Safety: Goal: Ability to remain free from injury will improve Outcome: Progressing   Problem: Skin Integrity: Goal: Risk for impaired skin integrity will decrease Outcome: Progressing

## 2021-02-18 NOTE — Progress Notes (Addendum)
[facility] NEPHROLOGY PROGRESS NOTE  Assessment/ Plan: Pt is a 54 y.o. yo female with history of psoriasis, COVID-19 infection, recent diagnosis of HTN restarted HCTZ presented with generalized body pain, shortness of breath and found to have AKI.  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 The urine study with proteinuria around 6g, serum albumin 1.5 and trace edema. No microscopic hematuria or WBC in the first urinalysis.   The repeat urinalysis was after placement of Foley catheter.  Kidney ultrasound without hydronephrosis. The lab result came back with elevated antidsDNA >300 with low C3 and C4. Hep B, C, HIV negative.  CK and ASO titer unremarkable.   Pending other serologies including ANA, ANCA, anti-GBM, kappa lambda ratio.  IR consulted for kidney biopsy, plan for biopsy on 3/21. Patient is oliguric, worsening BUN/creatinine level and has early uremic features therefore plan to start dialysis after placement of the catheter.  IR was consulted. I will start Solu-Medrol 250 mg daily for 3 days and then oral prednisone.  Further treatment including CellCept, voclosporin depending on the biopsy result Start pepcid 10 mg daily I have discussed above with the patient at length and she agreed.  Also discussed with the primary team.  #Hypertension: Blood pressure acceptable.  Continue to hold HCTZ.  #Exertional shortness of breath: Chest x-ray negative.    Echo with EF of 60%.  Plan to start HD, UF as tolerated.    #Sore throat: Reportedly she was treated with penicillin by her PCP.    ASO titer not elevated.  Subjective: Seen and examined at bedside.  Patient reports generalized body pain but not worse than yesterday.  She is resting on bed without any chest pain or shortness of breath.  Urine output only 90 cc in 24 hours.     Objective Vital signs in last 24 hours: Vitals:   02/17/21 0352 02/17/21 1422 02/17/21 2100 02/18/21 0521  BP: 120/60 129/62 124/62 131/75  Pulse: 65 70 69 61  Resp: 16 20 18 18   Temp: 98 F (36.7 C) 99 F (37.2 C) 97.7 F (36.5 C) 97.8 F (36.6 C)  TempSrc: Oral Oral Oral Axillary  SpO2: 90% 98% 94% 97%  Weight:      Height:       Weight change:   Intake/Output Summary (Last 24 hours) at 02/18/2021 0827 Last data filed at 02/18/2021 0536 Gross per 24 hour  Intake 1100.42 ml  Output 90 ml  Net 1010.42 ml       Labs: Basic Metabolic Panel: Recent Labs  Lab 02/16/21 1240 02/16/21 1724 02/17/21 0047 02/18/21 0230  NA 132*  --  131* 131*  K 5.1  --  4.1 4.1  CL 98  --  97* 99  CO2 20*  --  21* 18*  GLUCOSE 102*  --  105* 99  BUN 103*  --  105* 114*  CREATININE 10.18* 10.45* 10.53* 11.93*  CALCIUM 7.7*  --  7.4* 7.1*   Liver Function Tests: Recent Labs  Lab 02/16/21 1940 02/17/21 0047 02/18/21 0230  AST 123* 127* 132*  ALT 62* 63* 65*  ALKPHOS 69 68 69  BILITOT 1.3* 1.3* 1.1  PROT 5.1* 5.1* 4.8*  ALBUMIN 1.5* 1.5* 1.4*   No results for input(s): LIPASE, AMYLASE in the last 168 hours. No results for input(s): AMMONIA in the last 168 hours. CBC: Recent Labs  Lab 02/16/21 1240 02/16/21 1724 02/17/21 0047 02/18/21 0230  WBC 3.5* 3.3* 3.2* 3.1*  NEUTROABS  --   --  2.6 1.6*  HGB 12.1 12.7 11.8* 10.8*  HCT 38.2 38.6 36.5 32.2*  MCV 91.0 90.6 89.7 88.5  PLT 231 229 214 190   Cardiac Enzymes: Recent Labs  Lab 02/17/21 0802  CKTOTAL 333*   CBG: No results for input(s): GLUCAP in the last 168 hours.  Iron Studies: No results for input(s): IRON, TIBC, TRANSFERRIN, FERRITIN in the last 72 hours. Studies/Results: CT ABDOMEN PELVIS WO CONTRAST  Result Date: 02/16/2021 CLINICAL DATA:  Abdominal pain. EXAM: CT ABDOMEN AND PELVIS WITHOUT CONTRAST TECHNIQUE: Multidetector CT imaging of the abdomen and pelvis was performed following the standard  protocol without IV contrast. COMPARISON:  None. FINDINGS: Lower chest: No acute abnormality. Hepatobiliary: No focal liver abnormality is seen. No gallstones, gallbladder wall thickening, or biliary dilatation. Pancreas: Unremarkable. No pancreatic ductal dilatation or surrounding inflammatory changes. Spleen: Normal in size without focal abnormality. Adrenals/Urinary Tract: Adrenal glands appear normal. No hydronephrosis or renal obstruction is noted. No renal or ureteral calculi are noted. Urinary bladder is decompressed secondary to Foley catheter. Minimal inflammatory changes are noted around the right kidney suggesting possible pyelonephritis. Stomach/Bowel: Stomach is within normal limits. Appendix appears normal. No evidence of bowel wall thickening, distention, or inflammatory changes. Vascular/Lymphatic: No significant vascular findings are present. No enlarged abdominal or pelvic lymph nodes. Reproductive: No adnexal abnormality is noted. Probable small exophytic uterine fibroid is noted arising posteriorly from the uterus. Other: No abdominal wall hernia or abnormality. No abdominopelvic ascites. Musculoskeletal: No acute or significant osseous findings. IMPRESSION: 1. Minimal inflammatory changes are noted around the right kidney suggesting possible pyelonephritis. 2. No hydronephrosis or renal obstruction is noted. No renal or ureteral calculi are noted. 3. Probable small exophytic uterine fibroid is noted arising posteriorly from the uterus. Electronically Signed   By: Marijo Conception M.D.   On: 02/16/2021 18:32   DG Chest 2 View  Result Date: 02/16/2021 CLINICAL DATA:  Shortness of breath. EXAM: CHEST - 2 VIEW COMPARISON:  January 27, 2021. FINDINGS: The heart size and mediastinal contours are within normal limits. Both lungs are clear. No pneumothorax or pleural effusion is noted. The visualized skeletal structures are unremarkable. IMPRESSION: No active cardiopulmonary disease. Electronically  Signed   By: Marijo Conception M.D.   On: 02/16/2021 12:53   US Renal  Result Date: 02/16/2021 CLINICAL DATA:  Renal failure EXAM: RENAL / URINARY TRACT ULTRASOUND COMPLETE COMPARISON:  CT 02/16/2021 FINDINGS: Right Kidney: Renal measurements: 11.9 x 5.8 x 4.4 cm = volume: 159.2 mL. Cortex is echogenic. No mass or hydronephrosis. Trace perinephric fluid Left Kidney: Renal measurements: 9.7 x 5.2 x 4.9 cm = volume: 129.5 mL. Cortex is echogenic. No mass or hydronephrosis. Bladder: Empty by Foley catheter. Other: None. IMPRESSION: Echogenic kidneys bilaterally consistent with medical renal disease. No hydronephrosis Electronically Signed   By: Donavan Foil M.D.   On: 02/16/2021 20:23   ECHOCARDIOGRAM COMPLETE  Result Date: 02/17/2021    ECHOCARDIOGRAM REPORT   Patient Name:   Denise Watson Date of Exam: 02/17/2021 Medical Rec #:  [id number]       Height:       65.0 in Accession #:    [id number]      Weight:       240.0 lb Date of Birth:  30-Jul-1966        BSA:  2.138 m Patient Age:    62 years        BP:           120/60 mmHg Patient Gender: F               HR:           67 bpm. Exam Location:  Inpatient Procedure: 2D Echo Indications:    CHF-Acute Diastolic Y69.48  History:        Patient has no prior history of Echocardiogram examinations.  Sonographer:    Mikki Santee RDCS (AE) Referring Phys: [id number] [place]  1. Left ventricular ejection fraction, by estimation, is 60%. The left ventricle has normal function. The left ventricle has no regional wall motion abnormalities. There is mild left ventricular hypertrophy. Left ventricular diastolic parameters were low normal.  2. Right ventricular systolic function is normal. The right ventricular size is normal. Tricuspid regurgitation signal is inadequate for assessing PA pressure.  3. The mitral valve is normal in structure. Trivial mitral valve regurgitation. No evidence of mitral stenosis.  4. The aortic valve is tricuspid. Aortic  valve regurgitation is not visualized. No aortic stenosis is present.  5. The inferior vena cava is normal in size with greater than 50% respiratory variability, suggesting right atrial pressure of 3 mmHg. FINDINGS  Left Ventricle: Left ventricular ejection fraction, by estimation, is 60%. The left ventricle has normal function. The left ventricle has no regional wall motion abnormalities. The left ventricular internal cavity size was normal in size. There is mild left ventricular hypertrophy. Left ventricular diastolic parameters were normal. Right Ventricle: The right ventricular size is normal. No increase in right ventricular wall thickness. Right ventricular systolic function is normal. Tricuspid regurgitation signal is inadequate for assessing PA pressure. Left Atrium: Left atrial size was normal in size. Right Atrium: Right atrial size was normal in size. Pericardium: There is no evidence of pericardial effusion. Mitral Valve: The mitral valve is normal in structure. Trivial mitral valve regurgitation. No evidence of mitral valve stenosis. Tricuspid Valve: The tricuspid valve is normal in structure. Tricuspid valve regurgitation is trivial. No evidence of tricuspid stenosis. Aortic Valve: The aortic valve is tricuspid. Aortic valve regurgitation is not visualized. No aortic stenosis is present. Pulmonic Valve: The pulmonic valve was normal in structure. Pulmonic valve regurgitation is trivial. No evidence of pulmonic stenosis. Aorta: The aortic root is normal in size and structure. Venous: The inferior vena cava is normal in size with greater than 50% respiratory variability, suggesting right atrial pressure of 3 mmHg. IAS/Shunts: The interatrial septum was not well visualized.  LEFT VENTRICLE PLAX 2D LVIDd:         4.10 cm  Diastology LVIDs:         2.80 cm  LV e' medial:    6.53 cm/s LV PW:         1.20 cm  LV E/e' medial:  11.0 LV IVS:        1.20 cm  LV e' lateral:   9.57 cm/s LVOT diam:     2.10 cm  LV  E/e' lateral: 7.5 LV SV:         76 LV SV Index:   36 LVOT Area:     3.46 cm  RIGHT VENTRICLE RV S prime:     15.40 cm/s TAPSE (M-mode): 1.4 cm LEFT ATRIUM             Index       RIGHT ATRIUM  Index LA diam:        3.10 cm 1.45 cm/m  RA Area:     9.08 cm LA Vol (A2C):   27.6 ml 12.91 ml/m RA Volume:   15.80 ml 7.39 ml/m LA Vol (A4C):   39.6 ml 18.53 ml/m LA Biplane Vol: 35.3 ml 16.51 ml/m  AORTIC VALVE LVOT Vmax:   107.00 cm/s LVOT Vmean:  64.800 cm/s LVOT VTI:    0.220 m  AORTA Ao Root diam: 2.80 cm MITRAL VALVE MV Area (PHT): 2.42 cm    SHUNTS MV Decel Time: 313 msec    Systemic VTI:  0.22 m MV E velocity: 71.60 cm/s  Systemic Diam: 2.10 cm MV A velocity: 73.30 cm/s MV E/A ratio:  0.98 Cherlynn Kaiser MD Electronically signed by Cherlynn Kaiser MD Signature Date/Time: 02/17/2021/1:13:33 PM    Final     Medications: Infusions:   Scheduled Medications: . Chlorhexidine Gluconate Cloth  6 each Topical Daily  . heparin  5,000 Units Subcutaneous Q8H    have reviewed scheduled and prn medications.  Physical Exam: General:NAD, comfortable Heart:RRR, s1s2 nl Lungs:clear b/l, no crackle Abdomen:soft, Non-tender, non-distended Extremities: Trace LE edema Dialysis Access: None  Tephanie Escorcia Tanna Furry 02/18/2021,8:27 AM  LOS: 2 days

## 2021-02-18 NOTE — Evaluation (Signed)
Physical Therapy Evaluation Patient Details Name: Denise Watson MRN: [id number] DOB: February 04, 1966 Today's Date: 02/18/2021   History of Present Illness  Pt is a 55 y.o. female admitted 02/16/21 with fatigue, SOB. 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 S/p RIJ temp HD cath placement 3/19. Plan for renal biopsy 3/21. PMH includes HTN, obesity, psoriasis, COVID-19 (12/2020).    Clinical Impression  Pt presents with an overall decrease in functional mobility secondary to above. Pt reports that prior to getting sick in January, she was independent, exercising at gym and working; since then, has required increased assist from husband for ADLs, limited ambulation without DME due to fatigue and weakness. Today, pt required minA for limited mobility, stability improved with use of RW. Pt limited by generalized weakness, decreased activity tolerance and low energy levels. Increased time discussing DME needs and energy conservation strategies for return home. Pt would benefit from continued acute PT services to maximize functional mobility and independence prior to d/c with HHPT services.     Follow Up Recommendations Home health PT;Supervision for mobility/OOB    Equipment Recommendations  Rolling walker with 5" wheels; Shower seat with armrests (not 3in1)   Recommendations for Other Services       Precautions / Restrictions Precautions Precautions: Fall;Other (comment) Precaution Comments: Quick to fatigue Restrictions Weight Bearing Restrictions: No      Mobility  Bed Mobility Overal bed mobility: Modified Independent             General bed mobility comments: Increased time and effort, use of bed rail and HOB elevated    Transfers Overall transfer level: Needs assistance Equipment used: None;Rolling walker (2 wheeled) Transfers: Sit to/from Stand Sit to Stand: Min assist         General transfer comment: Increased time and effort; initial minA for  trunk elevation and stability standing from EOB without DME; additional trial from recliner with RW, cues for hand placement, pt reliant on momentum and minA for balance  Ambulation/Gait Ambulation/Gait assistance: Min assist;Min guard Gait Distance (Feet): 8 Feet Assistive device: 1 person hand held assist;Rolling walker (2 wheeled) Gait Pattern/deviations: Step-to pattern;Trunk flexed;Decreased stride length Gait velocity: Decreased   General Gait Details: Initial steps without DME, pt reliant on minA for HHA to maintain balance, very unsteady and guarded gait to recliner; additional gait trial with RW, steps forwards/backwards, pt still guarded and fatigued, min guard for balance; pt declined further distance secondary to fatigue  Stairs            Wheelchair Mobility    Modified Rankin (Stroke Patients Only)       Balance Overall balance assessment: Needs assistance   Sitting balance-Leahy Scale: Fair       Standing balance-Leahy Scale: Poor Standing balance comment: Reliant on UE support                             Pertinent Vitals/Pain Pain Assessment: Faces Faces Pain Scale: Hurts a little bit Pain Location: new RIJ HD temp cath site Pain Descriptors / Indicators: Discomfort Pain Intervention(s): Monitored during session    Home Living Family/patient expects to be discharged to:: Private residence Living Arrangements: Spouse/significant other Available Help at Discharge: Family;Available PRN/intermittently Type of Home: House Home Access: Level entry     Home Layout: Two level;Bed/bath upstairs Home Equipment: None      Prior Function Level of Independence: Needs assistance   Gait / Transfers Assistance Needed: Prior  to initial sickness in [place], pt independent, working ([employer], desk job), exercising at gym. Since getting sick, difficulty with fatigue levels, ambulating short distances without DME  ADL's / Homemaking  Assistance Needed: Since getting sick, pt requiring assist from husband to step over tub and take tub baths (no longer able to stand for showers); assist from husband for dressing and standing from toilet        Hand Dominance        Extremity/Trunk Assessment   Upper Extremity Assessment Upper Extremity Assessment: Overall WFL for tasks assessed    Lower Extremity Assessment Lower Extremity Assessment: Generalized weakness       Communication   Communication: No difficulties  Cognition Arousal/Alertness: Awake/alert Behavior During Therapy: WFL for tasks assessed/performed Overall Cognitive Status: Within Functional Limits for tasks assessed                                        General Comments General comments (skin integrity, edema, etc.): HR 58-80s (brief read of 110s unsure if artifact as HR immediately returned to 70s-80s); SpO2 95% on RA; seated BP 124/69, post-mobility BP 130/75. Increased time discussing DME needs and energy conservation strategies (handout provided)    Exercises     Assessment/Plan    PT Assessment Patient needs continued PT services  PT Problem List Decreased strength;Decreased activity tolerance;Decreased balance;Decreased mobility;Decreased knowledge of use of DME;Cardiopulmonary status limiting activity       PT Treatment Interventions DME instruction;Gait training;Stair training;Functional mobility training;Therapeutic activities;Therapeutic exercise;Balance training;Patient/family education    PT Goals (Current goals can be found in the Care Plan section)  Acute Rehab PT Goals Patient Stated Goal: Improved energy PT Goal Formulation: With patient Time For Goal Achievement: 03/04/21 Potential to Achieve Goals: Good    Frequency Min 3X/week   Barriers to discharge Decreased caregiver support Husband works    Co-evaluation               AM-PAC PT "6 Clicks" Mobility  Outcome Measure Help needed turning  from your back to your side while in a flat bed without using bedrails?: A Little Help needed moving from lying on your back to sitting on the side of a flat bed without using bedrails?: A Little Help needed moving to and from a bed to a chair (including a wheelchair)?: A Little Help needed standing up from a chair using your arms (e.g., wheelchair or bedside chair)?: A Little Help needed to walk in hospital room?: A Little Help needed climbing 3-5 steps with a railing? : A Lot 6 Click Score: 17    End of Session   Activity Tolerance: Patient tolerated treatment well;Patient limited by fatigue Patient left: in chair;with call bell/phone within reach Nurse Communication: Mobility status PT Visit Diagnosis: Other abnormalities of gait and mobility (R26.89);Difficulty in walking, not elsewhere classified (R26.2)    Time: 1245-8099 PT Time Calculation (min) (ACUTE ONLY): 29 min   Charges:   PT Evaluation $PT Eval Moderate Complexity: 1 Mod PT Treatments $Self Care/Home Management: 8-22   Ina Homes, PT, DPT Acute Rehabilitation Services  Pager [phone] Office [phone]  Malachy Chamber 02/18/2021, 3:18 PM

## 2021-02-18 NOTE — Progress Notes (Signed)
Patient arrived back to the unit via bed. She is a&o x4. Respirations are even and unlabored. VSS. She denies any pain or discomfort at this time. Hemodialysis site is visible, clean, dressing is dry and intact. Will continue to monitor. Call light and possessions in reach.

## 2021-02-18 NOTE — H&P (Signed)
Chief Complaint: Acute kidney failure  Referring Physician(s): Ronalee Belts  Supervising Physician: Gilmer Mor  Patient Status: [facility] - In-pt  History of Present Illness: Denise Watson is a 55 y.o. female with history of hypertension, obesity, COVID-19 infection in January, and psoriasis.  She presented to the emergency room on February 17, 2019 today complaining of sore throat, worsening fatigue, and shortness of breath.  Creatinine on admission was 10.  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  We are asked to place a temporary hemodialysis catheter today.  She is n.p.o. and is requesting sedation.  Past Medical History:  Diagnosis Date  . Swelling of both lower extremities     Past Surgical History:  Procedure Laterality Date  . CESAREAN SECTION      Allergies: Patient has no known allergies.  Medications: Prior to Admission medications   Medication Sig Start Date End Date Taking? Authorizing Provider  amoxicillin (AMOXIL) 875 MG tablet Take 875 mg by mouth 2 (two) times daily. 01/31/21  Yes [provider]  ascorbic acid (VITAMIN C) 500 MG tablet Take 500 mg by mouth daily.   Yes [provider]  Cholecalciferol 25 MCG (1000 UT) tablet Take 1,000 Units by mouth daily.   Yes [provider]  clotrimazole (LOTRIMIN) 1 % cream Apply 1 application topically 2 (two) times daily.   Yes [provider]  clotrimazole-betamethasone (LOTRISONE) cream Apply 1 application topically 2 (two) times daily.   Yes [provider]  hydrochlorothiazide (HYDRODIURIL) 25 MG tablet Take 25 mg by mouth daily.   Yes [provider]  vitamin B-12 (CYANOCOBALAMIN) 500 MCG tablet Take 500 mcg by mouth daily.   Yes [provider]     Family History  Problem Relation Age of Onset  . Kidney disease Father     Social History   Socioeconomic History  .  Marital status: Married    Spouse name: Not on file  . Number of children: Not on file  . Years of education: Not on file  . Highest education level: Not on file  Occupational History  . Not on file  Tobacco Use  . Smoking status: Never Smoker  . Smokeless tobacco: Never Used  Substance and Sexual Activity  . Alcohol use: Yes    Comment: occ  . Drug use: No  . Sexual activity: Not on file  Other Topics Concern  . Not on file  Social History Narrative  . Not on file   Social Determinants of Health   Financial Resource Strain: Not on file  Food Insecurity: Not on file  Transportation Needs: Not on file  Physical Activity: Not on file  Stress: Not on file  Social Connections: Not on file     Review of Systems: A 12 point ROS discussed and pertinent positives are indicated in the HPI above.  All other systems are negative.  Review of Systems  Vital Signs: BP 131/75 (BP Location: Left Arm)   Pulse 61   Temp 97.8 F (36.6 C) (Axillary)   Resp 18   Ht 5\' 5"  (1.651 m)   Wt 108.9 kg   SpO2 97%   BMI 39.94 kg/m   Physical Exam Vitals reviewed.  Constitutional:      Appearance: Normal appearance. She is obese.  HENT:     Head: Normocephalic and atraumatic.  Eyes:     Extraocular Movements: Extraocular movements intact.  Cardiovascular:  Rate and Rhythm: Normal rate and regular rhythm.  Pulmonary:     Effort: Pulmonary effort is normal. No respiratory distress.     Breath sounds: Normal breath sounds.  Abdominal:     General: There is no distension.     Palpations: Abdomen is soft.     Tenderness: There is no abdominal tenderness.  Musculoskeletal:        General: Normal range of motion.  Skin:    General: Skin is warm and dry.  Neurological:     General: No focal deficit present.     Mental Status: She is alert and oriented to person, place, and time.  Psychiatric:        Mood and Affect: Mood normal.        Behavior: Behavior normal.        Thought  Content: Thought content normal.        Judgment: Judgment normal.     Imaging: CT ABDOMEN PELVIS WO CONTRAST  Result Date: 02/16/2021 CLINICAL DATA:  Abdominal pain. EXAM: CT ABDOMEN AND PELVIS WITHOUT CONTRAST TECHNIQUE: Multidetector CT imaging of the abdomen and pelvis was performed following the standard protocol without IV contrast. COMPARISON:  None. FINDINGS: Lower chest: No acute abnormality. Hepatobiliary: No focal liver abnormality is seen. No gallstones, gallbladder wall thickening, or biliary dilatation. Pancreas: Unremarkable. No pancreatic ductal dilatation or surrounding inflammatory changes. Spleen: Normal in size without focal abnormality. Adrenals/Urinary Tract: Adrenal glands appear normal. No hydronephrosis or renal obstruction is noted. No renal or ureteral calculi are noted. Urinary bladder is decompressed secondary to Foley catheter. Minimal inflammatory changes are noted around the right kidney suggesting possible pyelonephritis. Stomach/Bowel: Stomach is within normal limits. Appendix appears normal. No evidence of bowel wall thickening, distention, or inflammatory changes. Vascular/Lymphatic: No significant vascular findings are present. No enlarged abdominal or pelvic lymph nodes. Reproductive: No adnexal abnormality is noted. Probable small exophytic uterine fibroid is noted arising posteriorly from the uterus. Other: No abdominal wall hernia or abnormality. No abdominopelvic ascites. Musculoskeletal: No acute or significant osseous findings. IMPRESSION: 1. Minimal inflammatory changes are noted around the right kidney suggesting possible pyelonephritis. 2. No hydronephrosis or renal obstruction is noted. No renal or ureteral calculi are noted. 3. Probable small exophytic uterine fibroid is noted arising posteriorly from the uterus. Electronically Signed   By: Lupita RaiderJames  Green Jr M.D.   On: 02/16/2021 18:32   CT Angio Head W or Wo Contrast  Result Date: 01/27/2021 CLINICAL DATA:   Diplopia.  Dural sinus thrombosis suspected. EXAM: CT ANGIOGRAPHY HEAD CT VENOGRAM HEAD TECHNIQUE: Multidetector CT imaging of the head was performed using the standard protocol during bolus administration of intravenous contrast. Multiplanar CT image reconstructions and MIPs were obtained to evaluate the arterial anatomy. Additional delayed multidetector CT imaging of the head was performed with multiplanar CT image reconstructions and MIPS to evaluate the venous anatomy. CONTRAST:  100mL OMNIPAQUE IOHEXOL 350 MG/ML SOLN COMPARISON:  None. FINDINGS: CT HEAD Brain: No evidence of acute large vascular territory infarction, acute hemorrhage, hydrocephalus, extra-axial collection or mass lesion/mass effect. Mild narrowing of the mamillopontine distance and effacement of the suprasellar cistern with trace tonsillar ectopia. Skull: No acute fracture. Sinuses: Visualized sinuses are clear. Orbits: No acute finding. CTA HEAD Due to venous timing, significantly limited evaluation. The intradural left vertebral artery is poorly opacified, which could relate to stenosis or non dominant vertebral artery. Otherwise, no evidence of proximal large vessel occlusion or large aneurysm in the anterior or posterior circulation. CT VENOGRAM  No evidence of dural sinus thrombosis. Small left transverse sinus and narrowing of the distal right transverse sinus. Small rounded filling defect in the distal right transverse sinus likely represents arachnoid granulation. IMPRESSION: CT head: 1. No definite evidence of acute intracranial abnormality. 2. Mild narrowing of the mamillopontine distance and effacement of the suprasellar cistern with trace tonsillar ectopia. Given the patient is young and has relative paucity of CSF spaces/sulci diffusely these findings may be within normal limits, but recommend correlation with signs/symptoms of intracranial hypotension including positional headaches. Additionally, per Dr. Rush Landmark the patient is  going to get a follow-up MRI which can further evaluate these findings. CTA head: Due to venous timing, significantly limited evaluation. The intradural left vertebral artery is not well opacified, which is poorly evaluated and could relate to nondominant vertebral artery or stenosis. A CTA neck could further evaluate if clinically indicated. Otherwise, no evidence of proximal large vessel occlusion or large aneurysm. A CTA of the neck could further evaluate the vertebral finding and a repeat CTA head could provide more sensitive intracranial arterial evaluation if clinically indicated. CTV: No evidence of dural sinus thrombosis. Findings discussed with Dr. Julieanne Manson at 3:05 PM via telephone. Electronically Signed   By: Feliberto Harts MD   On: 01/27/2021 15:15   DG Chest 2 View  Result Date: 02/16/2021 CLINICAL DATA:  Shortness of breath. EXAM: CHEST - 2 VIEW COMPARISON:  January 27, 2021. FINDINGS: The heart size and mediastinal contours are within normal limits. Both lungs are clear. No pneumothorax or pleural effusion is noted. The visualized skeletal structures are unremarkable. IMPRESSION: No active cardiopulmonary disease. Electronically Signed   By: Lupita Raider M.D.   On: 02/16/2021 12:53   US Renal  Result Date: 02/16/2021 CLINICAL DATA:  Renal failure EXAM: RENAL / URINARY TRACT ULTRASOUND COMPLETE COMPARISON:  CT 02/16/2021 FINDINGS: Right Kidney: Renal measurements: 11.9 x 5.8 x 4.4 cm = volume: 159.2 mL. Cortex is echogenic. No mass or hydronephrosis. Trace perinephric fluid Left Kidney: Renal measurements: 9.7 x 5.2 x 4.9 cm = volume: 129.5 mL. Cortex is echogenic. No mass or hydronephrosis. Bladder: Empty by Foley catheter. Other: None. IMPRESSION: Echogenic kidneys bilaterally consistent with medical renal disease. No hydronephrosis Electronically Signed   By: Jasmine Pang M.D.   On: 02/16/2021 20:23   DG Chest Portable 1 View  Result Date: 01/27/2021 CLINICAL DATA:  Persistent  cough. Recurrent upper respiratory infection EXAM: PORTABLE CHEST 1 VIEW COMPARISON:  Radiograph 06/27/2016 FINDINGS: 1355 hours. The heart size and mediastinal contours are normal. The lungs are clear. There is no pleural effusion or pneumothorax. No acute osseous findings are identified. Telemetry leads overlie the chest. IMPRESSION: No active cardiopulmonary process. Electronically Signed   By: Carey Bullocks M.D.   On: 01/27/2021 14:47   ECHOCARDIOGRAM COMPLETE  Result Date: 02/17/2021    ECHOCARDIOGRAM REPORT   Patient Name:   Denise Watson Date of Exam: 02/17/2021 Medical Rec #:  [id number]       Height:       65.0 in Accession #:    [id number]      Weight:       240.0 lb Date of Birth:  October 05, 1966        BSA:          2.138 m Patient Age:    54 years        BP:           120/60 mmHg Patient Gender: F  HR:           67 bpm. Exam Location:  Inpatient Procedure: 2D Echo Indications:    CHF-Acute Diastolic I50.31  History:        Patient has no prior history of Echocardiogram examinations.  Sonographer:    Thurman Coyer RDCS (AE) Referring Phys: [id number] [facility] IMPRESSIONS  1. Left ventricular ejection fraction, by estimation, is 60%. The left ventricle has normal function. The left ventricle has no regional wall motion abnormalities. There is mild left ventricular hypertrophy. Left ventricular diastolic parameters were low normal.  2. Right ventricular systolic function is normal. The right ventricular size is normal. Tricuspid regurgitation signal is inadequate for assessing PA pressure.  3. The mitral valve is normal in structure. Trivial mitral valve regurgitation. No evidence of mitral stenosis.  4. The aortic valve is tricuspid. Aortic valve regurgitation is not visualized. No aortic stenosis is present.  5. The inferior vena cava is normal in size with greater than 50% respiratory variability, suggesting right atrial pressure of 3 mmHg. FINDINGS  Left Ventricle: Left ventricular  ejection fraction, by estimation, is 60%. The left ventricle has normal function. The left ventricle has no regional wall motion abnormalities. The left ventricular internal cavity size was normal in size. There is mild left ventricular hypertrophy. Left ventricular diastolic parameters were normal. Right Ventricle: The right ventricular size is normal. No increase in right ventricular wall thickness. Right ventricular systolic function is normal. Tricuspid regurgitation signal is inadequate for assessing PA pressure. Left Atrium: Left atrial size was normal in size. Right Atrium: Right atrial size was normal in size. Pericardium: There is no evidence of pericardial effusion. Mitral Valve: The mitral valve is normal in structure. Trivial mitral valve regurgitation. No evidence of mitral valve stenosis. Tricuspid Valve: The tricuspid valve is normal in structure. Tricuspid valve regurgitation is trivial. No evidence of tricuspid stenosis. Aortic Valve: The aortic valve is tricuspid. Aortic valve regurgitation is not visualized. No aortic stenosis is present. Pulmonic Valve: The pulmonic valve was normal in structure. Pulmonic valve regurgitation is trivial. No evidence of pulmonic stenosis. Aorta: The aortic root is normal in size and structure. Venous: The inferior vena cava is normal in size with greater than 50% respiratory variability, suggesting right atrial pressure of 3 mmHg. IAS/Shunts: The interatrial septum was not well visualized.  LEFT VENTRICLE PLAX 2D LVIDd:         4.10 cm  Diastology LVIDs:         2.80 cm  LV e' medial:    6.53 cm/s LV PW:         1.20 cm  LV E/e' medial:  11.0 LV IVS:        1.20 cm  LV e' lateral:   9.57 cm/s LVOT diam:     2.10 cm  LV E/e' lateral: 7.5 LV SV:         76 LV SV Index:   36 LVOT Area:     3.46 cm  RIGHT VENTRICLE RV S prime:     15.40 cm/s TAPSE (M-mode): 1.4 cm LEFT ATRIUM             Index       RIGHT ATRIUM          Index LA diam:        3.10 cm 1.45 cm/m  RA  Area:     9.08 cm LA Vol (A2C):   27.6 ml 12.91 ml/m RA Volume:   15.80  ml 7.39 ml/m LA Vol (A4C):   39.6 ml 18.53 ml/m LA Biplane Vol: 35.3 ml 16.51 ml/m  AORTIC VALVE LVOT Vmax:   107.00 cm/s LVOT Vmean:  64.800 cm/s LVOT VTI:    0.220 m  AORTA Ao Root diam: 2.80 cm MITRAL VALVE MV Area (PHT): 2.42 cm    SHUNTS MV Decel Time: 313 msec    Systemic VTI:  0.22 m MV E velocity: 71.60 cm/s  Systemic Diam: 2.10 cm MV A velocity: 73.30 cm/s MV E/A ratio:  0.98 Weston Brass MD Electronically signed by Weston Brass MD Signature Date/Time: 02/17/2021/1:13:33 PM    Final    CT VENOGRAM HEAD  Result Date: 01/27/2021 CLINICAL DATA:  Diplopia.  Dural sinus thrombosis suspected. EXAM: CT ANGIOGRAPHY HEAD CT VENOGRAM HEAD TECHNIQUE: Multidetector CT imaging of the head was performed using the standard protocol during bolus administration of intravenous contrast. Multiplanar CT image reconstructions and MIPs were obtained to evaluate the arterial anatomy. Additional delayed multidetector CT imaging of the head was performed with multiplanar CT image reconstructions and MIPS to evaluate the venous anatomy. CONTRAST:  OMNIPAQUE IOHEXOL 350 MG/ML SOLN COMPARISON:  None. FINDINGS: CT HEAD Brain: No evidence of acute large vascular territory infarction, acute hemorrhage, hydrocephalus, extra-axial collection or mass lesion/mass effect. Mild narrowing of the mamillopontine distance and effacement of the suprasellar cistern with trace tonsillar ectopia. Skull: No acute fracture. Sinuses: Visualized sinuses are clear. Orbits: No acute finding. CTA HEAD Due to venous timing, significantly limited evaluation. The intradural left vertebral artery is poorly opacified, which could relate to stenosis or non dominant vertebral artery. Otherwise, no evidence of proximal large vessel occlusion or large aneurysm in the anterior or posterior circulation. CT VENOGRAM No evidence of dural sinus thrombosis. Small left transverse  sinus and narrowing of the distal right transverse sinus. Small rounded filling defect in the distal right transverse sinus likely represents arachnoid granulation. IMPRESSION: CT head: 1. No definite evidence of acute intracranial abnormality. 2. Mild narrowing of the mamillopontine distance and effacement of the suprasellar cistern with trace tonsillar ectopia. Given the patient is young and has relative paucity of CSF spaces/sulci diffusely these findings may be within normal limits, but recommend correlation with signs/symptoms of intracranial hypotension including positional headaches. Additionally, per Dr. Rush Landmark the patient is going to get a follow-up MRI which can further evaluate these findings. CTA head: Due to venous timing, significantly limited evaluation. The intradural left vertebral artery is not well opacified, which is poorly evaluated and could relate to nondominant vertebral artery or stenosis. A CTA neck could further evaluate if clinically indicated. Otherwise, no evidence of proximal large vessel occlusion or large aneurysm. A CTA of the neck could further evaluate the vertebral finding and a repeat CTA head could provide more sensitive intracranial arterial evaluation if clinically indicated. CTV: No evidence of dural sinus thrombosis. Findings discussed with Dr. Julieanne Manson at 3:05 PM via telephone. Electronically Signed   By: Feliberto Harts MD   On: 01/27/2021 15:15    Labs:  CBC: Recent Labs    02/16/21 1240 02/16/21 1724 02/17/21 0047 02/18/21 0230  WBC 3.5* 3.3* 3.2* 3.1*  HGB 12.1 12.7 11.8* 10.8*  HCT 38.2 38.6 36.5 32.2*  PLT 231 229 214 190    COAGS: Recent Labs    02/17/21 0047  INR 1.0    BMP: Recent Labs    01/27/21 1229 02/16/21 1240 02/16/21 1724 02/17/21 0047 02/18/21 0230  NA 139 132*  --  131* 131*  K 4.0 5.1  --  4.1 4.1  CL 103 98  --  97* 99  CO2 27 20*  --  21* 18*  GLUCOSE 93 102*  --  105* 99  BUN 13 103*  --  105* 114*  CALCIUM  8.6* 7.7*  --  7.4* 7.1*  CREATININE 0.97 10.18* 10.45* 10.53* 11.93*  GFRNONAA >60 4* 4* 4* 3*    LIVER FUNCTION TESTS: Recent Labs    01/27/21 1229 02/16/21 1940 02/17/21 0047 02/18/21 0230  BILITOT 0.3 1.3* 1.3* 1.1  AST 33 123* 127* 132*  ALT 25 62* 63* 65*  ALKPHOS 69 69 68 69  PROT 7.7 5.1* 5.1* 4.8*  ALBUMIN 3.6 1.5* 1.5* 1.4*    TUMOR MARKERS: No results for input(s): AFPTM, CEA, CA199, CHROMGRNA in the last 8760 hours.  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  We will proceed with image guided placement of temporary dialysis catheter today by Dr. Loreta Ave.  Risks and benefits discussed with the patient including, but not limited to bleeding, infection, vascular injury, pneumothorax which may require chest tube placement, air embolism or even death  All of the patient's questions were answered, patient is agreeable to proceed. Consent signed and in chart.  Thank you for this interesting consult.  I greatly enjoyed meeting [facility] and look forward to participating in their care.  A copy of this report was sent to the requesting provider on this date.  Electronically Signed: Gwynneth Macleod, PA-C   02/18/2021, 10:23 AM      I spent a total of 20 Minutes   in face to face in clinical consultation, greater than 50% of which was counseling/coordinating care for for image guided placement of temporary dialysis catheter.

## 2021-02-19 DIAGNOSIS — N179 Acute kidney failure, unspecified: Secondary | ICD-10-CM | POA: Diagnosis not present

## 2021-02-19 LAB — CBC WITH DIFFERENTIAL/PLATELET
Abs Immature Granulocytes: 0.04 10*3/uL (ref 0.00–0.07)
Basophils Absolute: 0 10*3/uL (ref 0.0–0.1)
Basophils Relative: 0 %
Eosinophils Absolute: 0 10*3/uL (ref 0.0–0.5)
Eosinophils Relative: 0 %
HCT: 32.9 % — ABNORMAL LOW (ref 36.0–46.0)
Hemoglobin: 11.4 g/dL — ABNORMAL LOW (ref 12.0–15.0)
Immature Granulocytes: 1 %
Lymphocytes Relative: 18 %
Lymphs Abs: 1 10*3/uL (ref 0.7–4.0)
MCH: 30.3 pg (ref 26.0–34.0)
MCHC: 34.7 g/dL (ref 30.0–36.0)
MCV: 87.5 fL (ref 80.0–100.0)
Monocytes Absolute: 0.2 10*3/uL (ref 0.1–1.0)
Monocytes Relative: 4 %
Neutro Abs: 4.1 10*3/uL (ref 1.7–7.7)
Neutrophils Relative %: 77 %
Platelets: 100 10*3/uL — ABNORMAL LOW (ref 150–400)
RBC: 3.76 MIL/uL — ABNORMAL LOW (ref 3.87–5.11)
RDW: 13.6 % (ref 11.5–15.5)
WBC: 5.3 10*3/uL (ref 4.0–10.5)
nRBC: 0 % (ref 0.0–0.2)

## 2021-02-19 LAB — CULTURE, GROUP A STREP (THRC): Special Requests: NORMAL

## 2021-02-19 LAB — COMPREHENSIVE METABOLIC PANEL
ALT: 55 U/L — ABNORMAL HIGH (ref 0–44)
AST: 110 U/L — ABNORMAL HIGH (ref 15–41)
Albumin: 1.3 g/dL — ABNORMAL LOW (ref 3.5–5.0)
Alkaline Phosphatase: 74 U/L (ref 38–126)
Anion gap: 14 (ref 5–15)
BUN: 81 mg/dL — ABNORMAL HIGH (ref 6–20)
CO2: 20 mmol/L — ABNORMAL LOW (ref 22–32)
Calcium: 7.1 mg/dL — ABNORMAL LOW (ref 8.9–10.3)
Chloride: 97 mmol/L — ABNORMAL LOW (ref 98–111)
Creatinine, Ser: 9.57 mg/dL — ABNORMAL HIGH (ref 0.44–1.00)
GFR, Estimated: 4 mL/min — ABNORMAL LOW (ref >60)
Glucose, Bld: 139 mg/dL — ABNORMAL HIGH (ref 70–99)
Potassium: 4.5 mmol/L (ref 3.5–5.1)
Sodium: 131 mmol/L — ABNORMAL LOW (ref 135–145)
Total Bilirubin: 0.5 mg/dL (ref 0.3–1.2)
Total Protein: 4.8 g/dL — ABNORMAL LOW (ref 6.5–8.1)

## 2021-02-19 LAB — BRAIN NATRIURETIC PEPTIDE: B Natriuretic Peptide: 23.5 pg/mL (ref 0.0–100.0)

## 2021-02-19 LAB — MAGNESIUM: Magnesium: 2 mg/dL (ref 1.7–2.4)

## 2021-02-19 MED ORDER — CHLORHEXIDINE GLUCONATE CLOTH 2 % EX PADS
6.0000 | MEDICATED_PAD | Freq: Every day | CUTANEOUS | Status: DC
  Administered 2021-02-19 – 2021-03-01 (×10): 6 via TOPICAL

## 2021-02-19 MED ORDER — HEPARIN SODIUM (PORCINE) 1000 UNIT/ML IJ SOLN
INTRAMUSCULAR | Status: AC
  Administered 2021-02-19: 1000 [IU] via INTRAVENOUS_CENTRAL
  Filled 2021-02-19: qty 4

## 2021-02-19 MED ORDER — ONDANSETRON HCL 4 MG/2ML IJ SOLN
INTRAMUSCULAR | Status: AC
  Administered 2021-02-19: 4 mg via INTRAVENOUS
  Filled 2021-02-19: qty 2

## 2021-02-19 MED ORDER — SODIUM CHLORIDE 0.9% FLUSH
10.0000 mL | INTRAVENOUS | Status: DC | PRN
Start: 2021-02-19 — End: 2021-03-01
  Administered 2021-02-19: 10 mL

## 2021-02-19 NOTE — Plan of Care (Signed)

## 2021-02-19 NOTE — Evaluation (Addendum)
Occupational Therapy Evaluation Patient Details Name: Denise Watson MRN: [id number] DOB: 1966-09-24 Today's Date: 02/19/2021    History of Present Illness Pt is a 55 y.o. female admitted 02/16/21 with fatigue, SOB. 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 S/p RIJ temp HD cath placement 3/19. Plan for renal biopsy 3/21. PMH includes HTN, obesity, psoriasis, COVID-19 (12/2020).   Clinical Impression   Pt was independent and active prior to admission. Presents with decrease activity tolerance and generalized weakness with impaired standing balance. She required min guard assist to stand and walk with RW short distance and set up to max assist for ADL. Will follow acutely. Recommending home with HHOT and support of her family.     Follow Up Recommendations  Home health OT    Equipment Recommendations  3 in 1   Recommendations for Other Services       Precautions / Restrictions Precautions Precautions: Fall Precaution Comments: Quick to fatigue      Mobility Bed Mobility               General bed mobility comments: received in chair    Transfers Overall transfer level: Needs assistance Equipment used: Rolling walker (2 wheeled) Transfers: Sit to/from Stand Sit to Stand: Min guard         General transfer comment: cues for hand placement, increased time    Balance Overall balance assessment: Needs assistance   Sitting balance-Leahy Scale: Good     Standing balance support: Bilateral upper extremity supported Standing balance-Leahy Scale: Poor Standing balance comment: Reliant on UE support                           ADL either performed or assessed with clinical judgement   ADL Overall ADL's : Needs assistance/impaired Eating/Feeding: Independent;Sitting   Grooming: Set up;Sitting;Oral care   Upper Body Bathing: Minimal assistance;Sitting   Lower Body Bathing: Moderate assistance;Sit to/from stand   Upper Body  Dressing : Minimal assistance;Sitting   Lower Body Dressing: Maximal assistance;Sit to/from stand   Toilet Transfer: Min guard;Ambulation;RW;BSC   Toileting- Architect and Hygiene: Min guard;Sit to/from stand       Functional mobility during ADLs: Min guard;Rolling walker       Vision Baseline Vision/History: Wears glasses Wears Glasses: At all times Patient Visual Report: No change from baseline       Perception     Praxis      Pertinent Vitals/Pain Pain Assessment: No/denies pain     Hand Dominance Right   Extremity/Trunk Assessment Upper Extremity Assessment Upper Extremity Assessment: Overall WFL for tasks assessed   Lower Extremity Assessment Lower Extremity Assessment: Defer to PT evaluation   Cervical / Trunk Assessment Cervical / Trunk Assessment: Normal   Communication Communication Communication: No difficulties   Cognition Arousal/Alertness: Awake/alert Behavior During Therapy: WFL for tasks assessed/performed Overall Cognitive Status: Within Functional Limits for tasks assessed                                     General Comments       Exercises     Shoulder Instructions      Home Living Family/patient expects to be discharged to:: Private residence Living Arrangements: Spouse/significant other Available Help at Discharge: Family;Available PRN/intermittently Type of Home: House Home Access: Level entry     Home Layout: Two level;Bed/bath upstairs Alternate Level Stairs-Number of  Steps: Flight Alternate Level Stairs-Rails: Right Bathroom Shower/Tub: Walk-in shower;Tub/shower unit   Bathroom Toilet: Standard     Home Equipment: None          Prior Functioning/Environment Level of Independence: Needs assistance  Gait / Transfers Assistance Needed: Prior to initial sickness in [place], pt independent, working ([employer], desk job), exercising at gym. Since getting sick, difficulty with  fatigue levels, ambulating short distances without DME ADL's / Homemaking Assistance Needed: Since getting sick, pt requiring assist from husband to step over tub and take tub baths (no longer able to stand for showers); assist from husband for dressing and standing from toilet            OT Problem List: Decreased activity tolerance;Impaired balance (sitting and/or standing);Decreased knowledge of use of DME or AE;Obesity      OT Treatment/Interventions: Self-care/ADL training;DME and/or AE instruction;Patient/family education;Balance training;Therapeutic activities    OT Goals(Current goals can be found in the care plan section) Acute Rehab OT Goals Patient Stated Goal: Improved energy OT Goal Formulation: With patient Time For Goal Achievement: 03/05/21 Potential to Achieve Goals: Good  OT Frequency: Min 2X/week   Barriers to D/C:            Co-evaluation              AM-PAC OT "6 Clicks" Daily Activity     Outcome Measure Help from another person eating meals?: None Help from another person taking care of personal grooming?: A Little Help from another person toileting, which includes using toliet, bedpan, or urinal?: A Little Help from another person bathing (including washing, rinsing, drying)?: A Lot Help from another person to put on and taking off regular upper body clothing?: A Little Help from another person to put on and taking off regular lower body clothing?: A Lot 6 Click Score: 17   End of Session Equipment Utilized During Treatment: Gait belt;Rolling walker  Activity Tolerance: Patient limited by fatigue Patient left: in chair;with call bell/phone within reach;with family/visitor present  OT Visit Diagnosis: Unsteadiness on feet (R26.81);Other abnormalities of gait and mobility (R26.89);Muscle weakness (generalized) (M62.81);Other (comment) (decreased activity tolerance)                Time: 4098-1191 OT Time Calculation (min): 14 min Charges:  OT  General Charges $OT Visit: 1 Visit OT Evaluation $OT Eval Moderate Complexity: 1 Mod  Martie Round, OTR/L Acute Rehabilitation Services Pager: [phone] Office: [phone]  Evern Bio 02/19/2021, 2:04 PM

## 2021-02-19 NOTE — Progress Notes (Signed)
PROGRESS NOTE                                                                                                                                                                                                             Patient Demographics:    Denise Watson, is a 55 y.o. female, DOB - [birth year], NHA:[id number]  Outpatient Primary MD for the patient is Piedad Climes, [place]    LOS - 3  Admit date - 02/16/2021    Chief Complaint  Patient presents with  . Shortness of Breath       Brief Narrative (HPI from H&P) -  Denise Watson  is a 55 y.o. female, who is in relatively good health except recent diagnosis of essential hypertension requiring HCTZ for the last few weeks, obesity, COVID-19 infection in January, psoriasis, C-section in the past, intermittent palpitations in the past.  Apparently patient has been having symptoms of sore throat for the last 2 weeks and subsequently has been developing gradual fatigue and some exertional shortness of breath, denies any fever chills, no chest or abdominal pain, no blood in stool or urine, no cough or sinus pressure, no swelling in arms or legs, she does have petechial rash but she says this is due to her psoriasis and is chronic.  She presented to the ER where she was found to have AKI with a creatinine of 10, nephrology was consulted and I was requested to admit the patient.  Currently review of systems positive for sore throat, fatigue, exertional shortness of breath, chronic petechial rash due to psoriasis according to the patient.  Other review of systems are negative   Subjective:   Patient in bed, appears comfortable, denies any headache, no fever, no chest pain or pressure, no shortness of breath , no abdominal pain. No focal weakness.   Assessment  & Plan :      1.  Generalized fatigue, exertional shortness of breath and pharyngitis.  In a patient with AKI, she was  recently diagnosed with hypertension and started with HCTZ as well.  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   2.  Petechial rash.  Patient claims this is chronic due to  psoriasis but could be due to platelet dysfunction caused by AKI.  Will monitor.  Platelet counts are stable pressure stable.  3.  Borderline hyperkalemia.  Stable after kayaxalate and HD.  4.  Exertional shortness of breath, likely due to AKI, stable TTE. HD now for any extra fluid removal.    5.  Obesity.  BMI 39.  Follow with PCP.  6. Hypertension.  Gentle IV hydralazine, will avoid hypotension due to AKI.  7.  Pharyngitis.  Strep throat limb culture negative follow final results, strep urinary antigen negative, ASO titer negative, likely viral pharyngitis  8.  History of psoriasis.  Question if she has discoid lupus.  Outpatient dermatology follow-up       Condition -   Guarded  Family Communication  : Beverlee Nims [phone]  02/17/2021  Code Status :  Full  Consults  :  Renal  PUD Prophylaxis :    Procedures  :     R.IJ HD Cath by IR 02/18/21  Renal US - medical renal disease.  TTE - 1. Left ventricular ejection fraction, by estimation, is 60%. The left ventricle has normal function. The left ventricle has no regional wall motion abnormalities. There is mild left ventricular hypertrophy. Left ventricular diastolic parameters were low normal.  2. Right ventricular systolic function is normal. The right ventricular size is normal. Tricuspid regurgitation signal is inadequate for assessing PA pressure.  3. The mitral valve is normal in structure. Trivial mitral valve regurgitation. No evidence of mitral stenosis.  4. The aortic valve is tricuspid. Aortic valve regurgitation is not visualized. No aortic  stenosis is present.  5. The inferior vena cava is normal in size with greater than 50% respiratory variability, suggesting right atrial pressure of 3 mmHg.       Disposition Plan  :    Status is: Inpatient  Remains inpatient appropriate because:IV treatments appropriate due to intensity of illness or inability to take PO   Dispo: The patient is from: Home              Anticipated d/c is to: Home              Patient currently is not medically stable to d/c.   Difficult to place patient No  DVT Prophylaxis  :  Heparin   Lab Results  Component Value Date   PLT 100 (L) 02/19/2021    Diet :  Diet Order            Diet NPO time specified Except for: Sips with Meds  Diet effective midnight           Diet renal with fluid restriction Fluid restriction: 1200 mL Fluid; Room service appropriate? Yes; Fluid consistency: Thin  Diet effective now                  Inpatient Medications  Scheduled Meds: . Chlorhexidine Gluconate Cloth  6 each Topical Q0600  . famotidine  10 mg Oral Daily  . heparin  5,000 Units Subcutaneous Q8H   Continuous Infusions: . methylPREDNISolone (SOLU-MEDROL) injection 250 mg (02/19/21 0932)   PRN Meds:.acetaminophen **OR** [DISCONTINUED] acetaminophen, alum & mag hydroxide-simeth, bisacodyl, hydrALAZINE, [DISCONTINUED] ondansetron **OR** ondansetron (ZOFRAN) IV, phenol, sodium chloride flush  Antibiotics  :    Anti-infectives (From admission, onward)   None       Time Spent in minutes  30   Susa Raring M.D on 02/19/2021 at 9:49 AM  To page go to [email or web address]   [facility] -  Office  [phone]   See all Orders from today for further details    Objective:   Vitals:   02/19/21 0252 02/19/21 0300 02/19/21 0350 02/19/21 0505  BP:  107/69 118/64   Pulse:      Resp: Temp:  97.7 F (36.5 C) 97.7 F (36.5 C)   TempSrc:  Oral Oral   SpO2:  95% 97%   Weight:      Height:        Wt Readings from Last 3  Encounters:  02/16/21 108.9 kg  01/27/21 108.9 kg  06/27/16 132 kg     Intake/Output Summary (Last 24 hours) at 02/19/2021 0949 Last data filed at 02/19/2021 0530 Gross per 24 hour  Intake 490 ml  Output 1115 ml  Net -625 ml     Physical Exam  Awake Alert, No new F.N deficits, Normal affect [place].AT,PERRAL Supple Neck,No JVD, No cervical lymphadenopathy appriciated.  Symmetrical Chest wall movement,  RRR,No Gallops, Rubs or new Murmurs, No Parasternal Heave +ve B.Sounds, Abd Soft, No tenderness, No organomegaly appriciated, No rebound - guarding or rigidity. No Cyanosis, does have pharyngitis on exam along with petechial rash and discoid rash, few crackles, foley, R IJ HD Cath     Data Review:    CBC Recent Labs  Lab 02/16/21 1724 02/17/21 0047 02/18/21 0230 02/18/21 1420 02/19/21 0206  WBC 3.3* 3.2* 3.1* 3.3* 5.3  HGB 12.7 11.8* 10.8* 12.1 11.4*  HCT 38.6 36.5 32.2* 35.7* 32.9*  PLT 229 214 190 254 100*  MCV 90.6 89.7 88.5 88.4 87.5  MCH 29.8 29.0 29.7 30.0 30.3  MCHC 32.9 32.3 33.5 33.9 34.7  RDW 13.6 13.4 13.6 13.8 13.6  LYMPHSABS  --  0.4* 1.1  --  1.0  MONOABS  --  0.2 0.3  --  0.2  EOSABS  --  0.0 0.0  --  0.0  BASOSABS  --  0.0 0.0  --  0.0    Recent Labs  Lab 02/16/21 1240 02/16/21 1724 02/16/21 1940 02/17/21 0047 02/18/21 0230 02/18/21 1420 02/19/21 0206  NA 132*  --   --  131* 131* 132* 131*  K 5.1  --   --  4.1 4.1 4.8 4.5  CL 98  --   --  97* 99 98 97*  CO2 20*  --   --  21* 18* 18* 20*  GLUCOSE 102*  --   --  105* 99 103* 139*  BUN 103*  --   --  105* 114* 119* 81*  CREATININE 10.18* 10.45*  --  10.53* 11.93* 12.28* 9.57*  CALCIUM 7.7*  --   --  7.4* 7.1* 7.2* 7.1*  AST  --   --  123* 127* 132*  --  110*  ALT  --   --  62* 63* 65*  --  55*  ALKPHOS  --   --  69 68 69  --  74  BILITOT  --   --  1.3* 1.3* 1.1  --  0.5  ALBUMIN  --   --  1.5* 1.5* 1.4* 1.5* 1.3*  MG  --   --   --  2.3 2.3  --  2.0  INR  --   --   --  1.0  --   --   --    TSH  --   --  1.836  --   --   --   --   HGBA1C  --   --  6.3*  --   --   --   --   BNP  --   --   --  17.6 13.6  --  23.5    ------------------------------------------------------------------------------------------------------------------ No results for input(s): CHOL, HDL, LDLCALC, TRIG, CHOLHDL, LDLDIRECT in the last 72 hours.  Lab Results  Component Value Date   HGBA1C 6.3 (H) 02/16/2021   ------------------------------------------------------------------------------------------------------------------ Recent Labs    02/16/21 1940  TSH 1.836    Cardiac Enzymes No results for input(s): CKMB, TROPONINI, MYOGLOBIN in the last 168 hours.  Invalid input(s): CK ------------------------------------------------------------------------------------------------------------------    Component Value Date/Time   BNP 23.5 02/19/2021 0206    Micro Results Recent Results (from the past 240 hour(s))  SARS CORONAVIRUS 2 (TAT 6-24 HRS) Nasopharyngeal Nasopharyngeal Swab     Status: None   Collection Time: 02/16/21  4:25 PM   Specimen: Nasopharyngeal Swab  Result Value Ref Range Status   SARS Coronavirus 2 NEGATIVE NEGATIVE Final    Comment: (NOTE) SARS-CoV-2 target nucleic acids are NOT DETECTED.  The SARS-CoV-2 RNA is generally detectable in upper and lower respiratory specimens during the acute phase of infection. Negative results do not preclude SARS-CoV-2 infection, do not rule out co-infections with other pathogens, and should not be used as the sole basis for treatment or other patient management decisions. Negative results must be combined with clinical observations, patient history, and epidemiological information. The expected result is Negative.  Fact Sheet for Patients: HairSlick.no  Fact Sheet for Healthcare Providers: quierodirigir.com  This test is not yet approved or cleared by the [place] FDA and  has  been authorized for detection and/or diagnosis of SARS-CoV-2 by FDA under an Emergency Use Authorization (EUA). This EUA will remain  in effect (meaning this test can be used) for the duration of the COVID-19 declaration under Se ction 564(b)(1) of the Act, 21 U.S.C. section 360bbb-3(b)(1), unless the authorization is terminated or revoked sooner.  Performed at [facility], [address], [place], [place] [address]   Respiratory (~20 pathogens) panel by PCR     Status: None   Collection Time: 02/16/21  5:06 PM   Specimen: Nasopharyngeal Swab; Respiratory  Result Value Ref Range Status   Adenovirus NOT DETECTED NOT DETECTED Final   Coronavirus 229E NOT DETECTED NOT DETECTED Final    Comment: (NOTE) The Coronavirus on the Respiratory Panel, DOES NOT test for the novel  Coronavirus (2019 nCoV)    Coronavirus HKU1 NOT DETECTED NOT DETECTED Final   Coronavirus NL63 NOT DETECTED NOT DETECTED Final   Coronavirus OC43 NOT DETECTED NOT DETECTED Final   Metapneumovirus NOT DETECTED NOT DETECTED Final   Rhinovirus / Enterovirus NOT DETECTED NOT DETECTED Final   Influenza A NOT DETECTED NOT DETECTED Final   Influenza B NOT DETECTED NOT DETECTED Final   Parainfluenza Virus 1 NOT DETECTED NOT DETECTED Final   Parainfluenza Virus 2 NOT DETECTED NOT DETECTED Final   Parainfluenza Virus 3 NOT DETECTED NOT DETECTED Final   Parainfluenza Virus 4 NOT DETECTED NOT DETECTED Final   Respiratory Syncytial Virus NOT DETECTED NOT DETECTED Final   Bordetella pertussis NOT DETECTED NOT DETECTED Final   Bordetella Parapertussis NOT DETECTED NOT DETECTED Final   Chlamydophila pneumoniae NOT DETECTED NOT DETECTED Final   Mycoplasma pneumoniae NOT DETECTED NOT DETECTED Final    Comment: Performed at [facility], [address], [place], [place] [address]  Culture, group A strep     Status: None (Preliminary result)   Collection Time:  02/17/21 11:24 AM   Specimen: Throat  Result Value Ref Range  Status   Specimen Description THROAT  Final   Special Requests Normal  Final   Culture   Final    CULTURE REINCUBATED FOR BETTER GROWTH Performed at [facility], [address], [place], [place] [address]    Report Status PENDING  Incomplete    Radiology Reports CT ABDOMEN PELVIS WO CONTRAST  Result Date: 02/16/2021 CLINICAL DATA:  Abdominal pain. EXAM: CT ABDOMEN AND PELVIS WITHOUT CONTRAST TECHNIQUE: Multidetector CT imaging of the abdomen and pelvis was performed following the standard protocol without IV contrast. COMPARISON:  None. FINDINGS: Lower chest: No acute abnormality. Hepatobiliary: No focal liver abnormality is seen. No gallstones, gallbladder wall thickening, or biliary dilatation. Pancreas: Unremarkable. No pancreatic ductal dilatation or surrounding inflammatory changes. Spleen: Normal in size without focal abnormality. Adrenals/Urinary Tract: Adrenal glands appear normal. No hydronephrosis or renal obstruction is noted. No renal or ureteral calculi are noted. Urinary bladder is decompressed secondary to Foley catheter. Minimal inflammatory changes are noted around the right kidney suggesting possible pyelonephritis. Stomach/Bowel: Stomach is within normal limits. Appendix appears normal. No evidence of bowel wall thickening, distention, or inflammatory changes. Vascular/Lymphatic: No significant vascular findings are present. No enlarged abdominal or pelvic lymph nodes. Reproductive: No adnexal abnormality is noted. Probable small exophytic uterine fibroid is noted arising posteriorly from the uterus. Other: No abdominal wall hernia or abnormality. No abdominopelvic ascites. Musculoskeletal: No acute or significant osseous findings. IMPRESSION: 1. Minimal inflammatory changes are noted around the right kidney suggesting possible pyelonephritis. 2. No hydronephrosis or renal obstruction is noted. No renal or ureteral calculi are noted. 3. Probable small exophytic uterine fibroid  is noted arising posteriorly from the uterus. Electronically Signed   By: Lupita Raider M.D.   On: 02/16/2021 18:32   CT Angio Head W or Wo Contrast  Result Date: 01/27/2021 CLINICAL DATA:  Diplopia.  Dural sinus thrombosis suspected. EXAM: CT ANGIOGRAPHY HEAD CT VENOGRAM HEAD TECHNIQUE: Multidetector CT imaging of the head was performed using the standard protocol during bolus administration of intravenous contrast. Multiplanar CT image reconstructions and MIPs were obtained to evaluate the arterial anatomy. Additional delayed multidetector CT imaging of the head was performed with multiplanar CT image reconstructions and MIPS to evaluate the venous anatomy. CONTRAST:  OMNIPAQUE IOHEXOL 350 MG/ML SOLN COMPARISON:  None. FINDINGS: CT HEAD Brain: No evidence of acute large vascular territory infarction, acute hemorrhage, hydrocephalus, extra-axial collection or mass lesion/mass effect. Mild narrowing of the mamillopontine distance and effacement of the suprasellar cistern with trace tonsillar ectopia. Skull: No acute fracture. Sinuses: Visualized sinuses are clear. Orbits: No acute finding. CTA HEAD Due to venous timing, significantly limited evaluation. The intradural left vertebral artery is poorly opacified, which could relate to stenosis or non dominant vertebral artery. Otherwise, no evidence of proximal large vessel occlusion or large aneurysm in the anterior or posterior circulation. CT VENOGRAM No evidence of dural sinus thrombosis. Small left transverse sinus and narrowing of the distal right transverse sinus. Small rounded filling defect in the distal right transverse sinus likely represents arachnoid granulation. IMPRESSION: CT head: 1. No definite evidence of acute intracranial abnormality. 2. Mild narrowing of the mamillopontine distance and effacement of the suprasellar cistern with trace tonsillar ectopia. Given the patient is young and has relative paucity of CSF spaces/sulci diffusely  these findings may be within normal limits, but recommend correlation with signs/symptoms of intracranial hypotension including positional headaches. Additionally, per Dr. Rush Landmark  the patient is going to get a follow-up MRI which can further evaluate these findings. CTA head: Due to venous timing, significantly limited evaluation. The intradural left vertebral artery is not well opacified, which is poorly evaluated and could relate to nondominant vertebral artery or stenosis. A CTA neck could further evaluate if clinically indicated. Otherwise, no evidence of proximal large vessel occlusion or large aneurysm. A CTA of the neck could further evaluate the vertebral finding and a repeat CTA head could provide more sensitive intracranial arterial evaluation if clinically indicated. CTV: No evidence of dural sinus thrombosis. Findings discussed with Dr. Julieanne Manson at 3:05 PM via telephone. Electronically Signed   By: Feliberto Harts MD   On: 01/27/2021 15:15   DG Chest 2 View  Result Date: 02/16/2021 CLINICAL DATA:  Shortness of breath. EXAM: CHEST - 2 VIEW COMPARISON:  January 27, 2021. FINDINGS: The heart size and mediastinal contours are within normal limits. Both lungs are clear. No pneumothorax or pleural effusion is noted. The visualized skeletal structures are unremarkable. IMPRESSION: No active cardiopulmonary disease. Electronically Signed   By: Lupita Raider M.D.   On: 02/16/2021 12:53   US Renal  Result Date: 02/16/2021 CLINICAL DATA:  Renal failure EXAM: RENAL / URINARY TRACT ULTRASOUND COMPLETE COMPARISON:  CT 02/16/2021 FINDINGS: Right Kidney: Renal measurements: 11.9 x 5.8 x 4.4 cm = volume: 159.2 mL. Cortex is echogenic. No mass or hydronephrosis. Trace perinephric fluid Left Kidney: Renal measurements: 9.7 x 5.2 x 4.9 cm = volume: 129.5 mL. Cortex is echogenic. No mass or hydronephrosis. Bladder: Empty by Foley catheter. Other: None. IMPRESSION: Echogenic kidneys bilaterally consistent with  medical renal disease. No hydronephrosis Electronically Signed   By: Jasmine Pang M.D.   On: 02/16/2021 20:23   IR Fluoro Guide CV Line Right  Result Date: 02/18/2021 INDICATION: 55 year old female referred for temporary hemodialysis catheter placement EXAM: IMAGE GUIDED PLACEMENT OF TEMPORARY HEMODIALYSIS CATHETER MEDICATIONS: None ANESTHESIA/SEDATION: Moderate (conscious) sedation was employed during this procedure. A total of Versed 1.0 mg and Fentanyl 50 mcg was administered intravenously. Moderate Sedation Time: 13 minutes. The patient's level of consciousness and vital signs were monitored continuously by radiology nursing throughout the procedure under my direct supervision. FLUOROSCOPY TIME:  Fluoroscopy Time: 0 minutes 6 seconds (1 mGy). COMPLICATIONS: None PROCEDURE: Informed written consent was obtained from the patient and the patient's family after a thorough discussion of the procedural risks, benefits and alternatives. All questions were addressed. A timeout was performed prior to the initiation of the procedure. The right neck and chest was prepped with chlorhexidine, and draped in the usual sterile fashion using maximum barrier technique (cap and mask, sterile gown, sterile gloves, large sterile sheet, hand hygiene and cutaneous antiseptic). Local anesthesia was attained by infiltration with 1% lidocaine without epinephrine. Ultrasound demonstrated patency of the right internal jugular vein, and this was documented with an image. Under real-time ultrasound guidance, this vein was accessed with a 21 gauge micropuncture needle and image documentation was performed. A small dermatotomy was made at the access site with an 11 scalpel. A 0.018" wire was advanced into the SVC and the access needle exchanged for a 28F micropuncture vascular sheath. The 0.018" wire was then removed and a 0.035" wire advanced into the IVC. Upon withdrawal of the 018 wire, the wire was marked for appropriate length of the  internal portion of the catheter. A 16 cm catheter was selected. Skin and subcutaneous tissues were serially dilated. Catheter was placed on the wire. The catheter  tip is positioned in the upper right atrium. This was documented with a spot image. Both ports of the hemodialysis catheter were then tested for excellent function. The ports were then locked with heparinized lock. Patient tolerated the procedure well and remained hemodynamically stable throughout. No complications were encountered and no significant blood loss was encountered. IMPRESSION: Status post image guided placement of temporary hemodialysis catheter. Catheter may be converted if needed. Signed, Yvone NeuJaime S. Reyne DumasWagner, DO, RPVI [facility] Electronically Signed   By: Gilmer MorJaime  Wagner D.O.   On: 02/18/2021 14:21   IR US Guide Vasc Access Right  Result Date: 02/18/2021 INDICATION: 55 year old female referred for temporary hemodialysis catheter placement EXAM: IMAGE GUIDED PLACEMENT OF TEMPORARY HEMODIALYSIS CATHETER MEDICATIONS: None ANESTHESIA/SEDATION: Moderate (conscious) sedation was employed during this procedure. A total of Versed 1.0 mg and Fentanyl 50 mcg was administered intravenously. Moderate Sedation Time: 13 minutes. The patient's level of consciousness and vital signs were monitored continuously by radiology nursing throughout the procedure under my direct supervision. FLUOROSCOPY TIME:  Fluoroscopy Time: 0 minutes 6 seconds (1 mGy). COMPLICATIONS: None PROCEDURE: Informed written consent was obtained from the patient and the patient's family after a thorough discussion of the procedural risks, benefits and alternatives. All questions were addressed. A timeout was performed prior to the initiation of the procedure. The right neck and chest was prepped with chlorhexidine, and draped in the usual sterile fashion using maximum barrier technique (cap and mask, sterile gown, sterile  gloves, large sterile sheet, hand hygiene and cutaneous antiseptic). Local anesthesia was attained by infiltration with 1% lidocaine without epinephrine. Ultrasound demonstrated patency of the right internal jugular vein, and this was documented with an image. Under real-time ultrasound guidance, this vein was accessed with a 21 gauge micropuncture needle and image documentation was performed. A small dermatotomy was made at the access site with an 11 scalpel. A 0.018" wire was advanced into the SVC and the access needle exchanged for a 61F micropuncture vascular sheath. The 0.018" wire was then removed and a 0.035" wire advanced into the IVC. Upon withdrawal of the 018 wire, the wire was marked for appropriate length of the internal portion of the catheter. A 16 cm catheter was selected. Skin and subcutaneous tissues were serially dilated. Catheter was placed on the wire. The catheter tip is positioned in the upper right atrium. This was documented with a spot image. Both ports of the hemodialysis catheter were then tested for excellent function. The ports were then locked with heparinized lock. Patient tolerated the procedure well and remained hemodynamically stable throughout. No complications were encountered and no significant blood loss was encountered. IMPRESSION: Status post image guided placement of temporary hemodialysis catheter. Catheter may be converted if needed. Signed, Yvone NeuJaime S. Reyne DumasWagner, DO, RPVI [facility] Electronically Signed   By: Gilmer MorJaime  Wagner D.O.   On: 02/18/2021 14:21   DG Chest Portable 1 View  Result Date: 01/27/2021 CLINICAL DATA:  Persistent cough. Recurrent upper respiratory infection EXAM: PORTABLE CHEST 1 VIEW COMPARISON:  Radiograph 06/27/2016 FINDINGS: 1355 hours. The heart size and mediastinal contours are normal. The lungs are clear. There is no pleural effusion or pneumothorax. No acute osseous findings are identified.  Telemetry leads overlie the chest. IMPRESSION: No active cardiopulmonary process. Electronically Signed   By: Carey BullocksWilliam  Veazey M.D.   On: 01/27/2021 14:47   ECHOCARDIOGRAM COMPLETE  Result Date: 02/17/2021    ECHOCARDIOGRAM REPORT   Patient Name:   Denise Watson Date of Exam: 02/17/2021 Medical Rec #:  [id number]       Height:       65.0 in Accession #:    [id number]      Weight:       240.0 lb Date of Birth:  03/19/1966        BSA:          2.138 m Patient Age:    54 years        BP:           120/60 mmHg Patient Gender: F               HR:           67 bpm. Exam Location:  Inpatient Procedure: 2D Echo Indications:    CHF-Acute Diastolic I50.31  History:        Patient has no prior history of Echocardiogram examinations.  Sonographer:    Thurman Coyer RDCS (AE) Referring Phys: [id number] [facility] IMPRESSIONS  1. Left ventricular ejection fraction, by estimation, is 60%. The left ventricle has normal function. The left ventricle has no regional wall motion abnormalities. There is mild left ventricular hypertrophy. Left ventricular diastolic parameters were low normal.  2. Right ventricular systolic function is normal. The right ventricular size is normal. Tricuspid regurgitation signal is inadequate for assessing PA pressure.  3. The mitral valve is normal in structure. Trivial mitral valve regurgitation. No evidence of mitral stenosis.  4. The aortic valve is tricuspid. Aortic valve regurgitation is not visualized. No aortic stenosis is present.  5. The inferior vena cava is normal in size with greater than 50% respiratory variability, suggesting right atrial pressure of 3 mmHg. FINDINGS  Left Ventricle: Left ventricular ejection fraction, by estimation, is 60%. The left ventricle has normal function. The left ventricle has no regional wall motion abnormalities. The left ventricular internal cavity size was normal in size. There is mild left ventricular hypertrophy. Left ventricular diastolic parameters  were normal. Right Ventricle: The right ventricular size is normal. No increase in right ventricular wall thickness. Right ventricular systolic function is normal. Tricuspid regurgitation signal is inadequate for assessing PA pressure. Left Atrium: Left atrial size was normal in size. Right Atrium: Right atrial size was normal in size. Pericardium: There is no evidence of pericardial effusion. Mitral Valve: The mitral valve is normal in structure. Trivial mitral valve regurgitation. No evidence of mitral valve stenosis. Tricuspid Valve: The tricuspid valve is normal in structure. Tricuspid valve regurgitation is trivial. No evidence of tricuspid stenosis. Aortic Valve: The aortic valve is tricuspid. Aortic valve regurgitation is not visualized. No aortic stenosis is present. Pulmonic Valve: The pulmonic valve was normal in structure. Pulmonic valve regurgitation is trivial. No evidence of pulmonic stenosis. Aorta: The aortic root is normal in size and structure. Venous: The inferior vena cava is normal in size with greater than 50% respiratory variability, suggesting right atrial pressure of 3 mmHg. IAS/Shunts: The interatrial septum was not well visualized.  LEFT VENTRICLE PLAX 2D LVIDd:         4.10 cm  Diastology LVIDs:         2.80 cm  LV e' medial:    6.53 cm/s LV PW:         1.20 cm  LV E/e' medial:  11.0 LV IVS:        1.20 cm  LV e' lateral:   9.57 cm/s LVOT diam:     2.10 cm  LV E/e'  lateral: 7.5 LV SV:         76 LV SV Index:   36 LVOT Area:     3.46 cm  RIGHT VENTRICLE RV S prime:     15.40 cm/s TAPSE (M-mode): 1.4 cm LEFT ATRIUM             Index       RIGHT ATRIUM          Index LA diam:        3.10 cm 1.45 cm/m  RA Area:     9.08 cm LA Vol (A2C):   27.6 ml 12.91 ml/m RA Volume:   15.80 ml 7.39 ml/m LA Vol (A4C):   39.6 ml 18.53 ml/m LA Biplane Vol: 35.3 ml 16.51 ml/m  AORTIC VALVE LVOT Vmax:   107.00 cm/s LVOT Vmean:  64.800 cm/s LVOT VTI:    0.220 m  AORTA Ao Root diam: 2.80 cm MITRAL VALVE MV  Area (PHT): 2.42 cm    SHUNTS MV Decel Time: 313 msec    Systemic VTI:  0.22 m MV E velocity: 71.60 cm/s  Systemic Diam: 2.10 cm MV A velocity: 73.30 cm/s MV E/A ratio:  0.98 Weston Brass MD Electronically signed by Weston Brass MD Signature Date/Time: 02/17/2021/1:13:33 PM    Final    CT VENOGRAM HEAD  Result Date: 01/27/2021 CLINICAL DATA:  Diplopia.  Dural sinus thrombosis suspected. EXAM: CT ANGIOGRAPHY HEAD CT VENOGRAM HEAD TECHNIQUE: Multidetector CT imaging of the head was performed using the standard protocol during bolus administration of intravenous contrast. Multiplanar CT image reconstructions and MIPs were obtained to evaluate the arterial anatomy. Additional delayed multidetector CT imaging of the head was performed with multiplanar CT image reconstructions and MIPS to evaluate the venous anatomy. CONTRAST:  OMNIPAQUE IOHEXOL 350 MG/ML SOLN COMPARISON:  None. FINDINGS: CT HEAD Brain: No evidence of acute large vascular territory infarction, acute hemorrhage, hydrocephalus, extra-axial collection or mass lesion/mass effect. Mild narrowing of the mamillopontine distance and effacement of the suprasellar cistern with trace tonsillar ectopia. Skull: No acute fracture. Sinuses: Visualized sinuses are clear. Orbits: No acute finding. CTA HEAD Due to venous timing, significantly limited evaluation. The intradural left vertebral artery is poorly opacified, which could relate to stenosis or non dominant vertebral artery. Otherwise, no evidence of proximal large vessel occlusion or large aneurysm in the anterior or posterior circulation. CT VENOGRAM No evidence of dural sinus thrombosis. Small left transverse sinus and narrowing of the distal right transverse sinus. Small rounded filling defect in the distal right transverse sinus likely represents arachnoid granulation. IMPRESSION: CT head: 1. No definite evidence of acute intracranial abnormality. 2. Mild narrowing of the mamillopontine  distance and effacement of the suprasellar cistern with trace tonsillar ectopia. Given the patient is young and has relative paucity of CSF spaces/sulci diffusely these findings may be within normal limits, but recommend correlation with signs/symptoms of intracranial hypotension including positional headaches. Additionally, per Dr. Rush Landmark the patient is going to get a follow-up MRI which can further evaluate these findings. CTA head: Due to venous timing, significantly limited evaluation. The intradural left vertebral artery is not well opacified, which is poorly evaluated and could relate to nondominant vertebral artery or stenosis. A CTA neck could further evaluate if clinically indicated. Otherwise, no evidence of proximal large vessel occlusion or large aneurysm. A CTA of the neck could further evaluate the vertebral finding and a repeat CTA head could provide more sensitive intracranial arterial evaluation if clinically indicated. CTV: No evidence of  dural sinus thrombosis. Findings discussed with Dr. Julieanne Manson at 3:05 PM via telephone. Electronically Signed   By: Feliberto Harts MD   On: 01/27/2021 15:15

## 2021-02-19 NOTE — Progress Notes (Addendum)
[facility] NEPHROLOGY PROGRESS NOTE  Assessment/ Plan: Pt is a 55 y.o. yo female with history of psoriasis, COVID-19 infection, recent diagnosis of HTN restarted HCTZ presented with generalized body pain, shortness of breath and found to have AKI.  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 The urine study with proteinuria around 6g, serum albumin 1.5 and trace edema. No microscopic hematuria or WBC in the first urinalysis.   The repeat urinalysis was after placement of Foley catheter.  Kidney ultrasound without hydronephrosis. The lab result came back with elevated antidsDNA >300 with low C3 and C4. Hep B, C, HIV, ANCA, ASO negative.  Pending other serologies including ANA, anti-GBM, kappa lambda ratio.  IR consulted for kidney biopsy, plan for biopsy on 3/21. We started solumedrol 250 mg IV on 3/19 for 3 days and then plan to start prednisone 60 mg on 3/22. Further treatment including CellCept, voclosporin depending on the biopsy result. Continue pepcid 10 mg daily. Started dialysis on 3/19 for oliguric AKI and azotemia. RIJ temp HD catheter placed by IR. Tolerated first HD well with UF around 1 L.  Still having some shortness of breath therefore plan for another treatment today to optimize before kidney biopsy. Discussed with the patient and dialysis nurse.  #Hypertension: Blood pressure acceptable.  Continue to hold HCTZ.  #Exertional shortness of breath: Chest x-ray negative.    Echo with EF of 60%.  UF with HD.  #Sore throat: Reportedly she was treated with penicillin by her PCP.    ASO titer not elevated.  Subjective: Seen and examined at bedside.  Tolerated first dialysis well.  Still having some shortness of breath and weakness.  No major event.  Urine output only 115 cc.  Her sister at bedside.  Discussed at length about further plan,  treatment and follow-up etc. Objective Vital signs in last 24 hours: Vitals:   02/19/21 0252 02/19/21 0300 02/19/21 0350 02/19/21 0505  BP:  107/69 118/64   Pulse:      Resp: 15 15 15 17   Temp:  97.7 F (36.5 C) 97.7 F (36.5 C)   TempSrc:  Oral Oral   SpO2:  95% 97%   Weight:      Height:       Weight change:   Intake/Output Summary (Last 24 hours) at 02/19/2021 1039 Last data filed at 02/19/2021 0530 Gross per 24 hour  Intake 490 ml  Output 1115 ml  Net -625 ml       Labs: Basic Metabolic Panel: Recent Labs  Lab 02/18/21 0230 02/18/21 1420 02/19/21 0206  NA 131* 132* 131*  K 4.1 4.8 4.5  CL 99 98 97*  CO2 18* 18* 20*  GLUCOSE 99 103* 139*  BUN 114* 119* 81*  CREATININE 11.93* 12.28* 9.57*  CALCIUM 7.1* 7.2* 7.1*  PHOS  --  9.0*  --    Liver Function Tests: Recent Labs  Lab 02/17/21 0047 02/18/21 0230 02/18/21 1420 02/19/21 0206  AST 127* 132*  --  110*  ALT 63* 65*  --  55*  ALKPHOS 68 69  --  74  BILITOT 1.3* 1.1  --  0.5  PROT 5.1* 4.8*  --  4.8*  ALBUMIN 1.5* 1.4* 1.5* 1.3*   No results for input(s): LIPASE, AMYLASE in the last 168 hours. No results for input(s): AMMONIA in the last 168 hours. CBC: Recent Labs  Lab 02/16/21 1724 02/17/21 0047 02/18/21 0230 02/18/21 1420 02/19/21 0206  WBC 3.3* 3.2* 3.1* 3.3* 5.3  NEUTROABS  --  2.6 1.6*  --  4.1  HGB 12.7 11.8* 10.8* 12.1 11.4*  HCT 38.6 36.5 32.2* 35.7* 32.9*  MCV 90.6 89.7 88.5 88.4 87.5  PLT 229 214 190 254 100*   Cardiac Enzymes: Recent Labs  Lab 02/17/21 0802  CKTOTAL 333*   CBG: No results for input(s): GLUCAP in the last 168 hours.  Iron Studies: No results for input(s): IRON, TIBC, TRANSFERRIN, FERRITIN in the last 72 hours. Studies/Results: IR Fluoro Guide CV Line Right  Result Date: 02/18/2021 INDICATION: 55 year old female referred for temporary hemodialysis catheter placement EXAM: IMAGE GUIDED PLACEMENT OF TEMPORARY HEMODIALYSIS CATHETER MEDICATIONS: None  ANESTHESIA/SEDATION: Moderate (conscious) sedation was employed during this procedure. A total of Versed 1.0 mg and Fentanyl 50 mcg was administered intravenously. Moderate Sedation Time: 13 minutes. The patient's level of consciousness and vital signs were monitored continuously by radiology nursing throughout the procedure under my direct supervision. FLUOROSCOPY TIME:  Fluoroscopy Time: 0 minutes 6 seconds (1 mGy). COMPLICATIONS: None PROCEDURE: Informed written consent was obtained from the patient and the patient's family after a thorough discussion of the procedural risks, benefits and alternatives. All questions were addressed. A timeout was performed prior to the initiation of the procedure. The right neck and chest was prepped with chlorhexidine, and draped in the usual sterile fashion using maximum barrier technique (cap and mask, sterile gown, sterile gloves, large sterile sheet, hand hygiene and cutaneous antiseptic). Local anesthesia was attained by infiltration with 1% lidocaine without epinephrine. Ultrasound demonstrated patency of the right internal jugular vein, and this was documented with an image. Under real-time ultrasound guidance, this vein was accessed with a 21 gauge micropuncture needle and image documentation was performed. A small dermatotomy was made at the access site with an 11 scalpel. A 0.018" wire was advanced into the SVC and the access needle exchanged for a 32F micropuncture vascular sheath. The 0.018" wire was then removed and a 0.035" wire advanced into the IVC. Upon withdrawal of the 018 wire, the wire was marked for appropriate length of the internal portion of the catheter. A 16 cm catheter was selected. Skin and subcutaneous tissues were serially dilated. Catheter was placed on the wire. The catheter tip is positioned in the upper right atrium. This was documented with a spot image. Both ports of the hemodialysis catheter were then tested for excellent function. The ports  were then locked with heparinized lock. Patient tolerated the procedure well and remained hemodynamically stable throughout. No complications were encountered and no significant blood loss was encountered. IMPRESSION: Status post image guided placement of temporary hemodialysis catheter. Catheter may be converted if needed. Signed, Dulcy Fanny. Dellia Nims, RPVI [facility] Radiology Electronically Signed   By: Corrie Mckusick D.O.   On: 02/18/2021 14:21   IR US Guide Vasc Access Right  Result Date: 02/18/2021 INDICATION: 54 year old female referred for temporary hemodialysis catheter placement EXAM: IMAGE GUIDED PLACEMENT OF TEMPORARY HEMODIALYSIS CATHETER MEDICATIONS: None ANESTHESIA/SEDATION: Moderate (conscious) sedation was employed during this procedure. A total of Versed 1.0 mg and Fentanyl 50 mcg was administered intravenously. Moderate Sedation Time: 13 minutes. The patient's level of consciousness and vital signs were monitored continuously by radiology nursing throughout the procedure under my direct supervision. FLUOROSCOPY TIME:  Fluoroscopy Time: 0 minutes 6 seconds (1 mGy). COMPLICATIONS: None PROCEDURE: Informed written consent was obtained from the patient and  the patient's family after a thorough discussion of the procedural risks, benefits and alternatives. All questions were addressed. A timeout was performed prior to the initiation of the procedure. The right neck and chest was prepped with chlorhexidine, and draped in the usual sterile fashion using maximum barrier technique (cap and mask, sterile gown, sterile gloves, large sterile sheet, hand hygiene and cutaneous antiseptic). Local anesthesia was attained by infiltration with 1% lidocaine without epinephrine. Ultrasound demonstrated patency of the right internal jugular vein, and this was documented with an image. Under real-time ultrasound guidance, this vein was accessed with a 21 gauge  micropuncture needle and image documentation was performed. A small dermatotomy was made at the access site with an 11 scalpel. A 0.018" wire was advanced into the SVC and the access needle exchanged for a 52F micropuncture vascular sheath. The 0.018" wire was then removed and a 0.035" wire advanced into the IVC. Upon withdrawal of the 018 wire, the wire was marked for appropriate length of the internal portion of the catheter. A 16 cm catheter was selected. Skin and subcutaneous tissues were serially dilated. Catheter was placed on the wire. The catheter tip is positioned in the upper right atrium. This was documented with a spot image. Both ports of the hemodialysis catheter were then tested for excellent function. The ports were then locked with heparinized lock. Patient tolerated the procedure well and remained hemodynamically stable throughout. No complications were encountered and no significant blood loss was encountered. IMPRESSION: Status post image guided placement of temporary hemodialysis catheter. Catheter may be converted if needed. Signed, Dulcy Fanny. Dellia Nims, RPVI [facility] Radiology Electronically Signed   By: Corrie Mckusick D.O.   On: 02/18/2021 14:21    Medications: Infusions: . methylPREDNISolone (SOLU-MEDROL) injection 250 mg (02/19/21 0932)    Scheduled Medications: . Chlorhexidine Gluconate Cloth  6 each Topical Q0600  . famotidine  10 mg Oral Daily  . heparin  5,000 Units Subcutaneous Q8H    have reviewed scheduled and prn medications.  Physical Exam: General:NAD, comfortable Heart:RRR, s1s2 nl Lungs: Bibasal crackles, no increase work of breathing Abdomen:soft, Non-tender, non-distended Extremities: Trace LE edema Dialysis Access: Right IJ temporary HD catheter site looks clean  Denise Watson 02/19/2021,10:39 AM  LOS: 3 days

## 2021-02-20 ENCOUNTER — Inpatient Hospital Stay (HOSPITAL_COMMUNITY): Payer: BC Managed Care – PPO

## 2021-02-20 DIAGNOSIS — N179 Acute kidney failure, unspecified: Secondary | ICD-10-CM | POA: Diagnosis not present

## 2021-02-20 LAB — CBC WITH DIFFERENTIAL/PLATELET
Abs Immature Granulocytes: 0.05 10*3/uL (ref 0.00–0.07)
Basophils Absolute: 0 10*3/uL (ref 0.0–0.1)
Basophils Relative: 0 %
Eosinophils Absolute: 0 10*3/uL (ref 0.0–0.5)
Eosinophils Relative: 0 %
HCT: 32.1 % — ABNORMAL LOW (ref 36.0–46.0)
Hemoglobin: 10.8 g/dL — ABNORMAL LOW (ref 12.0–15.0)
Immature Granulocytes: 1 %
Lymphocytes Relative: 16 %
Lymphs Abs: 1.1 10*3/uL (ref 0.7–4.0)
MCH: 29.8 pg (ref 26.0–34.0)
MCHC: 33.6 g/dL (ref 30.0–36.0)
MCV: 88.7 fL (ref 80.0–100.0)
Monocytes Absolute: 0.6 10*3/uL (ref 0.1–1.0)
Monocytes Relative: 9 %
Neutro Abs: 5.2 10*3/uL (ref 1.7–7.7)
Neutrophils Relative %: 74 %
Platelets: 93 10*3/uL — ABNORMAL LOW (ref 150–400)
RBC: 3.62 MIL/uL — ABNORMAL LOW (ref 3.87–5.11)
RDW: 13.6 % (ref 11.5–15.5)
WBC: 7 10*3/uL (ref 4.0–10.5)
nRBC: 0 % (ref 0.0–0.2)

## 2021-02-20 LAB — ANCA TITERS
Atypical P-ANCA titer: <1:20 {titer}
C-ANCA: <1:20 {titer}
P-ANCA: <1:20 {titer}

## 2021-02-20 LAB — COMPREHENSIVE METABOLIC PANEL
ALT: 81 U/L — ABNORMAL HIGH (ref 0–44)
AST: 163 U/L — ABNORMAL HIGH (ref 15–41)
Albumin: 1.6 g/dL — ABNORMAL LOW (ref 3.5–5.0)
Alkaline Phosphatase: 78 U/L (ref 38–126)
Anion gap: 11 (ref 5–15)
BUN: 71 mg/dL — ABNORMAL HIGH (ref 6–20)
CO2: 22 mmol/L (ref 22–32)
Calcium: 7.7 mg/dL — ABNORMAL LOW (ref 8.9–10.3)
Chloride: 97 mmol/L — ABNORMAL LOW (ref 98–111)
Creatinine, Ser: 9.47 mg/dL — ABNORMAL HIGH (ref 0.44–1.00)
GFR, Estimated: 5 mL/min — ABNORMAL LOW (ref >60)
Glucose, Bld: 152 mg/dL — ABNORMAL HIGH (ref 70–99)
Potassium: 4.5 mmol/L (ref 3.5–5.1)
Sodium: 130 mmol/L — ABNORMAL LOW (ref 135–145)
Total Bilirubin: 0.6 mg/dL (ref 0.3–1.2)
Total Protein: 5.3 g/dL — ABNORMAL LOW (ref 6.5–8.1)

## 2021-02-20 LAB — KAPPA/LAMBDA LIGHT CHAINS
Kappa free light chain: 738.9 mg/L — ABNORMAL HIGH (ref 3.3–19.4)
Kappa, lambda light chain ratio: 2.15 — ABNORMAL HIGH (ref 0.26–1.65)
Lambda free light chains: 344 mg/L — ABNORMAL HIGH (ref 5.7–26.3)

## 2021-02-20 LAB — MAGNESIUM: Magnesium: 2.2 mg/dL (ref 1.7–2.4)

## 2021-02-20 LAB — FANA STAINING PATTERNS: Homogeneous Pattern: 1 — AB

## 2021-02-20 LAB — ANTINUCLEAR ANTIBODIES, IFA: ANA Ab, IFA: POSITIVE — AB

## 2021-02-20 LAB — BRAIN NATRIURETIC PEPTIDE: B Natriuretic Peptide: 34.5 pg/mL (ref 0.0–100.0)

## 2021-02-20 MED ORDER — FENTANYL CITRATE (PF) 100 MCG/2ML IJ SOLN
INTRAMUSCULAR | Status: AC
  Filled 2021-02-20: qty 2

## 2021-02-20 MED ORDER — MIDAZOLAM HCL 2 MG/2ML IJ SOLN
INTRAMUSCULAR | Status: AC
  Filled 2021-02-20: qty 2

## 2021-02-20 MED ORDER — PREDNISONE 20 MG PO TABS
60.0000 mg | ORAL_TABLET | Freq: Every day | ORAL | Status: DC
  Administered 2021-02-21 – 2021-03-01 (×8): 60 mg via ORAL
  Filled 2021-02-20 (×9): qty 3

## 2021-02-20 MED ORDER — MIDAZOLAM HCL 2 MG/2ML IJ SOLN
INTRAMUSCULAR | Status: AC | PRN
  Administered 2021-02-20: 1 mg via INTRAVENOUS

## 2021-02-20 MED ORDER — FENTANYL CITRATE (PF) 100 MCG/2ML IJ SOLN
INTRAMUSCULAR | Status: AC | PRN
  Administered 2021-02-20: 25 ug via INTRAVENOUS

## 2021-02-20 NOTE — Plan of Care (Signed)

## 2021-02-20 NOTE — TOC Transition Note (Signed)
Transition of Care [facility]) - CM/SW Discharge Note   Patient Details  Name: Denise Watson MRN: [id number] Date of Birth: 1966-09-18  [facility]) CM/SW Contact:  Durenda Guthrie, RN Phone Number: 02/20/2021, 11:16 AM   Clinical Narrative:   Case manager spoke with patient concerning discharge needs. Discussed choice for Home Health agencies.[facility] Liaison. DME has been requested from [employer]. Patient will have family support at discharge. TOC team will continue to monitor.    Final next level of care: Home w Home Health Services Barriers to Discharge: Continued Medical Work up   Patient Goals and CMS Choice Patient states their goals for this hospitalization and ongoing recovery are:: get better CMS Medicare.gov Compare Post Acute Care list provided to:: Patient Choice offered to / list presented to : Patient  Discharge Placement                       Discharge Plan and Services   Discharge Planning Services: CM Consult Post Acute Care Choice: Durable Medical Equipment,Home Health          DME Arranged: 3-N-1,Walker rolling,Other see comment (shower chair with arm rests) DME Agency: [employer] Date DME Agency Contacted: 02/20/21 Time DME Agency Contacted: 1115 Representative spoke with at DME Agency: Velna Hatchet HH Arranged: PT,OT HH Agency: [facility] Date [facility] Agency Contacted: 02/20/21 Time HH Agency Contacted: 1110 Representative spoke with at [facility] Agency: Kandee Keen  Social Determinants of Health (SDOH) Interventions     Readmission Risk Interventions No flowsheet data found.

## 2021-02-20 NOTE — Procedures (Signed)
Interventional Radiology Procedure:   Indications: Acute kidney injury with nephrotic syndrome  Procedure: US guided random renal biopsy  Findings: Two cores from right kidney lower pole  Complications: None     EBL: less than 10 ml  Plan: Bedrest 4 hours   Denise Mccannon R. Lowella Dandy, MD  Pager: [phone]

## 2021-02-20 NOTE — Progress Notes (Signed)
[facility] NEPHROLOGY PROGRESS NOTE  Assessment/ Plan: Pt is a 55 y.o. yo female with history of psoriasis, COVID-19 infection, recent diagnosis of HTN restarted HCTZ presented with generalized body pain, shortness of breath and found to have AKI.  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 The urine study with proteinuria around 6g, serum albumin 1.5 and trace edema. No microscopic hematuria or WBC in the first urinalysis.   The repeat urinalysis was after placement of Foley catheter.  Kidney ultrasound without hydronephrosis. The lab result came back with elevated antidsDNA >300 with low C3 and C4. Hep B, C, HIV, ANCA, ASO negative.  Pending other serologies including ANA, anti-GBM, kappa lambda ratio.  IR consulted for kidney biopsy, plan for biopsy on 3/21.  -We started solumedrol 250 mg IV on 3/19 for 3 days and then plan to start prednisone 60 mg on 3/22. Further treatment including CellCept, voclosporin, cytoxan depending on the biopsy result. Continue pepcid 10 mg daily. -Started dialysis on 3/19 for oliguric AKI and azotemia. RIJ temp HD catheter placed by IR. Tolerated first HD well with UF around 1 L.  Dialyzed again on 3/20 to optimize for biopsy. Next HD tomorrow  #Hypertension: Blood pressure acceptable.  Continue to hold HCTZ.  #Exertional shortness of breath: Chest x-ray negative.    Echo with EF of 60%.  UF with HD.  #Sore throat: Reportedly she was treated with penicillin by her PCP.    ASO titer not elevated.  Subjective: Seen and examined at bedside.  Tolerated HD yesterday, net UF 836cc. SOB better. No complaints. Biopsy today. uop not charted  Objective Vital signs in last 24 hours: Vitals:   02/19/21 1626 02/19/21 1639 02/19/21 2054 02/20/21 0426  BP: 108/65 109/60 109/67 119/66  Pulse: (!) 59 (!) 59 60 65  Resp: (!)  22 20 15 15   Temp: 98 F (36.7 C) 97.9 F (36.6 C) 98 F (36.7 C) 97.6 F (36.4 C)  TempSrc: Oral Oral Oral Oral  SpO2: 98%  95% 92%  Weight:      Height:       Weight change:   Intake/Output Summary (Last 24 hours) at 02/20/2021 1242 Last data filed at 02/19/2021 1825 Gross per 24 hour  Intake 240 ml  Output 836 ml  Net -596 ml       Labs: Basic Metabolic Panel: Recent Labs  Lab 02/18/21 1420 02/19/21 0206 02/20/21 0310  NA 132* 131* 130*  K 4.8 4.5 4.5  CL 98 97* 97*  CO2 18* 20* 22  GLUCOSE 103* 139* 152*  BUN 119* 81* 71*  CREATININE 12.28* 9.57* 9.47*  CALCIUM 7.2* 7.1* 7.7*  PHOS 9.0*  --   --    Liver Function Tests: Recent Labs  Lab 02/18/21 0230 02/18/21 1420 02/19/21 0206 02/20/21 0310  AST 132*  --  110* 163*  ALT 65*  --  55* 81*  ALKPHOS 69  --  74 78  BILITOT 1.1  --  0.5 0.6  PROT 4.8*  --  4.8* 5.3*  ALBUMIN 1.4* 1.5* 1.3* 1.6*   No results for input(s): LIPASE, AMYLASE in the last 168 hours. No results for input(s): AMMONIA in the last 168 hours. CBC: Recent Labs  Lab 02/17/21 0047 02/18/21 0230 02/18/21 1420 02/19/21 0206 02/20/21 0310  WBC 3.2* 3.1* 3.3* 5.3 7.0  NEUTROABS 2.6 1.6*  --  4.1 5.2  HGB 11.8* 10.8* 12.1 11.4* 10.8*  HCT 36.5 32.2* 35.7* 32.9* 32.1*  MCV 89.7 88.5 88.4 87.5 88.7  PLT 214 190 254 100* 93*   Cardiac Enzymes: Recent Labs  Lab 02/17/21 0802  CKTOTAL 333*   CBG: No results for input(s): GLUCAP in the last 168 hours.  Iron Studies: No results for input(s): IRON, TIBC, TRANSFERRIN, FERRITIN in the last 72 hours. Studies/Results: No results found.  Medications: Infusions:   Scheduled Medications: . Chlorhexidine Gluconate Cloth  6 each Topical Q0600  . famotidine  10 mg Oral Daily    have reviewed scheduled and prn medications.  Physical Exam: General:NAD, comfortable Heart:RRR, s1s2 nl Lungs: decreased breath sounds bibasilar, no increase work of breathing Abdomen:soft,  Non-tender, non-distended Extremities: Trace LE edema b/l Dialysis Access: Right IJ temporary HD catheter site looks clean  Zaeem Kandel 02/20/2021,12:42 PM  LOS: 4 days

## 2021-02-20 NOTE — Progress Notes (Signed)
PT Cancellation Note  Patient Details Name: Denise Watson MRN: [id number] DOB: 10/15/1966   Cancelled Treatment:    Reason Eval/Treat Not Completed: Patient at procedure or test/unavailable. Will continue to follow.    Angelina Ok [facility] 02/20/2021, 2:43 PM Skip Mayer PT Acute Rehabilitation Services Pager [phone] Office [phone]

## 2021-02-20 NOTE — Progress Notes (Addendum)
PROGRESS NOTE                                                                                                                                                                                                             Patient Demographics:    Denise Watson, is a 55 y.o. female, DOB - [birth year], EYC:[id number]  Outpatient Primary MD for the patient is Piedad Climes, [place]    LOS - 4  Admit date - 02/16/2021    Chief Complaint  Patient presents with  . Shortness of Breath       Brief Narrative (HPI from H&P) -  Denise Watson  is a 55 y.o. female, who is in relatively good health except recent diagnosis of essential hypertension requiring HCTZ for the last few weeks, obesity, COVID-19 infection in January, psoriasis, C-section in the past, intermittent palpitations in the past.  Apparently patient has been having symptoms of sore throat for the last 2 weeks and subsequently has been developing gradual fatigue and some exertional shortness of breath, denies any fever chills, no chest or abdominal pain, no blood in stool or urine, no cough or sinus pressure, no swelling in arms or legs, she does have petechial rash but she says this is due to her psoriasis and is chronic.  She presented to the ER where she was found to have AKI with a creatinine of 10, nephrology was consulted and I was requested to admit the patient.  Currently review of systems positive for sore throat, fatigue, exertional shortness of breath, chronic petechial rash due to psoriasis according to the patient.  Other review of systems are negative   Subjective:   Patient in bed, appears comfortable, denies any headache, no fever, no chest pain or pressure, no shortness of breath , no abdominal pain. No focal weakness.   Assessment  & Plan :      1.  Generalized fatigue, exertional shortness of breath and pharyngitis.  In a patient with AKI, she was  recently diagnosed with hypertension and started with HCTZ as well.  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   2.  Petechial rash.  Patient claims this is chronic due to  psoriasis but could be due to platelet dysfunction caused by AKI.  Will monitor.  Platelet counts are stable pressure stable.  3.  Borderline hyperkalemia.  Stable after kayaxalate and HD.  4.  Exertional shortness of breath, likely due to AKI, stable TTE. HD now for any extra fluid removal.    5.  Obesity.  BMI 39.  Follow with PCP.  6. Hypertension.  Gentle IV hydralazine, will avoid hypotension due to AKI.  7.  Pharyngitis.  Strep throat culture negative, strep urinary antigen negative, ASO titer negative, likely viral pharyngitis  8.  History of psoriasis.  Question if she has discoid lupus.  Outpatient dermatology follow-up.  9.  Asymptomatic mild rise in LFTs.  Would be due to hepatic congestion along with acute hepatitis panel negative, she is symptom-free.  Check limited right upper quadrant ultrasound and will continue to monitor.       Condition -   Guarded  Family Communication  : Beverlee Nims [phone]  02/17/2021  Code Status :  Full  Consults  :  Renal  PUD Prophylaxis :    Procedures  :     R.IJ HD Cath by IR 02/18/21  Renal US - medical renal disease.  TTE - 1. Left ventricular ejection fraction, by estimation, is 60%. The left ventricle has normal function. The left ventricle has no regional wall motion abnormalities. There is mild left ventricular hypertrophy. Left ventricular diastolic parameters were low normal.  2. Right ventricular systolic function is normal. The right ventricular size is normal. Tricuspid regurgitation signal is inadequate for assessing PA pressure.  3. The  mitral valve is normal in structure. Trivial mitral valve regurgitation. No evidence of mitral stenosis.  4. The aortic valve is tricuspid. Aortic valve regurgitation is not visualized. No aortic stenosis is present.  5. The inferior vena cava is normal in size with greater than 50% respiratory variability, suggesting right atrial pressure of 3 mmHg.       Disposition Plan  :    Status is: Inpatient  Remains inpatient appropriate because:IV treatments appropriate due to intensity of illness or inability to take PO   Dispo: The patient is from: Home              Anticipated d/c is to: Home              Patient currently is not medically stable to d/c.   Difficult to place patient No  DVT Prophylaxis  :  Heparin   Lab Results  Component Value Date   PLT 93 (L) 02/20/2021    Diet :  Diet Order            Diet NPO time specified Except for: Sips with Meds  Diet effective midnight                  Inpatient Medications  Scheduled Meds: . Chlorhexidine Gluconate Cloth  6 each Topical Q0600  . famotidine  10 mg Oral Daily   Continuous Infusions:  PRN Meds:.acetaminophen **OR** [DISCONTINUED] acetaminophen, alum & mag hydroxide-simeth, bisacodyl, hydrALAZINE, [DISCONTINUED] ondansetron **OR** ondansetron (ZOFRAN) IV, phenol, sodium chloride flush  Antibiotics  :    Anti-infectives (From admission, onward)   None       Time Spent in minutes  30   Susa Raring M.D on 02/20/2021 at 11:11 AM  To page go to [email or web address]   [facility] -  Office  [phone]   See all Orders from today for further details  Objective:   Vitals:   02/19/21 1626 02/19/21 1639 02/19/21 2054 02/20/21 0426  BP: 108/65 109/60 109/67 119/66  Pulse: (!) 59 (!) 59 60 65  Resp: (!) Temp: 98 F (36.7 C) 97.9 F (36.6 C) 98 F (36.7 C) 97.6 F (36.4 C)  TempSrc: Oral Oral Oral Oral  SpO2: 98%  95% 92%  Weight:      Height:        Wt Readings from Last 3  Encounters:  02/16/21 108.9 kg  01/27/21 108.9 kg  06/27/16 132 kg     Intake/Output Summary (Last 24 hours) at 02/20/2021 1111 Last data filed at 02/19/2021 1825 Gross per 24 hour  Intake 240 ml  Output 836 ml  Net -596 ml     Physical Exam  Awake Alert, No new F.N deficits, Normal affect [place].AT,PERRAL Supple Neck,No JVD, No cervical lymphadenopathy appriciated.  Symmetrical Chest wall movement, Good air movement bilaterally, CTAB RRR,No Gallops, Rubs or new Murmurs, No Parasternal Heave +ve B.Sounds, Abd Soft, No tenderness, No organomegaly appriciated, No rebound - guarding or rigidity. No Cyanosis, improving pharyngitis, petechial rash and discoid rash, few crackles, foley, R IJ HD Cath    Data Review:    CBC Recent Labs  Lab 02/17/21 0047 02/18/21 0230 02/18/21 1420 02/19/21 0206 02/20/21 0310  WBC 3.2* 3.1* 3.3* 5.3 7.0  HGB 11.8* 10.8* 12.1 11.4* 10.8*  HCT 36.5 32.2* 35.7* 32.9* 32.1*  PLT 214 190 254 100* 93*  MCV 89.7 88.5 88.4 87.5 88.7  MCH 29.0 29.7 30.0 30.3 29.8  MCHC 32.3 33.5 33.9 34.7 33.6  RDW 13.4 13.6 13.8 13.6 13.6  LYMPHSABS 0.4* 1.1  --  1.0 1.1  MONOABS 0.2 0.3  --  0.2 0.6  EOSABS 0.0 0.0  --  0.0 0.0  BASOSABS 0.0 0.0  --  0.0 0.0    Recent Labs  Lab 02/16/21 1940 02/17/21 0047 02/18/21 0230 02/18/21 1420 02/19/21 0206 02/20/21 0310  NA  --  131* 131* 132* 131* 130*  K  --  4.1 4.1 4.8 4.5 4.5  CL  --  97* 99 98 97* 97*  CO2  --  21* 18* 18* 20* 22  GLUCOSE  --  105* 99 103* 139* 152*  BUN  --  105* 114* 119* 81* 71*  CREATININE  --  10.53* 11.93* 12.28* 9.57* 9.47*  CALCIUM  --  7.4* 7.1* 7.2* 7.1* 7.7*  AST 123* 127* 132*  --  110* 163*  ALT 62* 63* 65*  --  55* 81*  ALKPHOS 69 68 69  --  74 78  BILITOT 1.3* 1.3* 1.1  --  0.5 0.6  ALBUMIN 1.5* 1.5* 1.4* 1.5* 1.3* 1.6*  MG  --  2.3 2.3  --  2.0 2.2  INR  --  1.0  --   --   --   --   TSH 1.836  --   --   --   --   --   HGBA1C 6.3*  --   --   --   --   --   BNP  --   17.6 13.6  --  23.5 34.5    ------------------------------------------------------------------------------------------------------------------ No results for input(s): CHOL, HDL, LDLCALC, TRIG, CHOLHDL, LDLDIRECT in the last 72 hours.  Lab Results  Component Value Date   HGBA1C 6.3 (H) 02/16/2021   ------------------------------------------------------------------------------------------------------------------ No results for input(s): TSH, T4TOTAL, T3FREE, THYROIDAB in the last 72 hours.  Invalid input(s): FREET3  Cardiac Enzymes No results for input(s): CKMB, TROPONINI, MYOGLOBIN in the last 168 hours.  Invalid input(s): CK ------------------------------------------------------------------------------------------------------------------    Component Value Date/Time   BNP 34.5 02/20/2021 0310    Micro Results Recent Results (from the past 240 hour(s))  SARS CORONAVIRUS 2 (TAT 6-24 HRS) Nasopharyngeal Nasopharyngeal Swab     Status: None   Collection Time: 02/16/21  4:25 PM   Specimen: Nasopharyngeal Swab  Result Value Ref Range Status   SARS Coronavirus 2 NEGATIVE NEGATIVE Final    Comment: (NOTE) SARS-CoV-2 target nucleic acids are NOT DETECTED.  The SARS-CoV-2 RNA is generally detectable in upper and lower respiratory specimens during the acute phase of infection. Negative results do not preclude SARS-CoV-2 infection, do not rule out co-infections with other pathogens, and should not be used as the sole basis for treatment or other patient management decisions. Negative results must be combined with clinical observations, patient history, and epidemiological information. The expected result is Negative.  Fact Sheet for Patients: HairSlick.no  Fact Sheet for Healthcare Providers: quierodirigir.com  This test is not yet approved or cleared by the [place] FDA and  has been authorized for detection and/or  diagnosis of SARS-CoV-2 by FDA under an Emergency Use Authorization (EUA). This EUA will remain  in effect (meaning this test can be used) for the duration of the COVID-19 declaration under Se ction 564(b)(1) of the Act, 21 U.S.C. section 360bbb-3(b)(1), unless the authorization is terminated or revoked sooner.  Performed at [facility], [address] [place], [place] [address]   Respiratory (~20 pathogens) panel by PCR     Status: None   Collection Time: 02/16/21  5:06 PM   Specimen: Nasopharyngeal Swab; Respiratory  Result Value Ref Range Status   Adenovirus NOT DETECTED NOT DETECTED Final   Coronavirus 229E NOT DETECTED NOT DETECTED Final    Comment: (NOTE) The Coronavirus on the Respiratory Panel, DOES NOT test for the novel  Coronavirus (2019 nCoV)    Coronavirus HKU1 NOT DETECTED NOT DETECTED Final   Coronavirus NL63 NOT DETECTED NOT DETECTED Final   Coronavirus OC43 NOT DETECTED NOT DETECTED Final   Metapneumovirus NOT DETECTED NOT DETECTED Final   Rhinovirus / Enterovirus NOT DETECTED NOT DETECTED Final   Influenza A NOT DETECTED NOT DETECTED Final   Influenza B NOT DETECTED NOT DETECTED Final   Parainfluenza Virus 1 NOT DETECTED NOT DETECTED Final   Parainfluenza Virus 2 NOT DETECTED NOT DETECTED Final   Parainfluenza Virus 3 NOT DETECTED NOT DETECTED Final   Parainfluenza Virus 4 NOT DETECTED NOT DETECTED Final   Respiratory Syncytial Virus NOT DETECTED NOT DETECTED Final   Bordetella pertussis NOT DETECTED NOT DETECTED Final   Bordetella Parapertussis NOT DETECTED NOT DETECTED Final   Chlamydophila pneumoniae NOT DETECTED NOT DETECTED Final   Mycoplasma pneumoniae NOT DETECTED NOT DETECTED Final    Comment: Performed at [facility], [address], [place], [place] [address]  Culture, group A strep     Status: None   Collection Time: 02/17/21 11:24 AM   Specimen: Throat  Result Value Ref Range Status   Specimen Description THROAT  Final   Special  Requests Normal  Final   Culture   Final    NO GROUP A STREP (S.PYOGENES) ISOLATED Performed at [facility], [address], [place], [place] [address]    Report Status 02/19/2021 FINAL  Final    Radiology Reports CT ABDOMEN PELVIS WO CONTRAST  Result Date: 02/16/2021 CLINICAL  DATA:  Abdominal pain. EXAM: CT ABDOMEN AND PELVIS WITHOUT CONTRAST TECHNIQUE: Multidetector CT imaging of the abdomen and pelvis was performed following the standard protocol without IV contrast. COMPARISON:  None. FINDINGS: Lower chest: No acute abnormality. Hepatobiliary: No focal liver abnormality is seen. No gallstones, gallbladder wall thickening, or biliary dilatation. Pancreas: Unremarkable. No pancreatic ductal dilatation or surrounding inflammatory changes. Spleen: Normal in size without focal abnormality. Adrenals/Urinary Tract: Adrenal glands appear normal. No hydronephrosis or renal obstruction is noted. No renal or ureteral calculi are noted. Urinary bladder is decompressed secondary to Foley catheter. Minimal inflammatory changes are noted around the right kidney suggesting possible pyelonephritis. Stomach/Bowel: Stomach is within normal limits. Appendix appears normal. No evidence of bowel wall thickening, distention, or inflammatory changes. Vascular/Lymphatic: No significant vascular findings are present. No enlarged abdominal or pelvic lymph nodes. Reproductive: No adnexal abnormality is noted. Probable small exophytic uterine fibroid is noted arising posteriorly from the uterus. Other: No abdominal wall hernia or abnormality. No abdominopelvic ascites. Musculoskeletal: No acute or significant osseous findings. IMPRESSION: 1. Minimal inflammatory changes are noted around the right kidney suggesting possible pyelonephritis. 2. No hydronephrosis or renal obstruction is noted. No renal or ureteral calculi are noted. 3. Probable small exophytic uterine fibroid is noted arising posteriorly from the uterus.  Electronically Signed   By: Lupita Raider M.D.   On: 02/16/2021 18:32   CT Angio Head W or Wo Contrast  Result Date: 01/27/2021 CLINICAL DATA:  Diplopia.  Dural sinus thrombosis suspected. EXAM: CT ANGIOGRAPHY HEAD CT VENOGRAM HEAD TECHNIQUE: Multidetector CT imaging of the head was performed using the standard protocol during bolus administration of intravenous contrast. Multiplanar CT image reconstructions and MIPs were obtained to evaluate the arterial anatomy. Additional delayed multidetector CT imaging of the head was performed with multiplanar CT image reconstructions and MIPS to evaluate the venous anatomy. CONTRAST:  OMNIPAQUE IOHEXOL 350 MG/ML SOLN COMPARISON:  None. FINDINGS: CT HEAD Brain: No evidence of acute large vascular territory infarction, acute hemorrhage, hydrocephalus, extra-axial collection or mass lesion/mass effect. Mild narrowing of the mamillopontine distance and effacement of the suprasellar cistern with trace tonsillar ectopia. Skull: No acute fracture. Sinuses: Visualized sinuses are clear. Orbits: No acute finding. CTA HEAD Due to venous timing, significantly limited evaluation. The intradural left vertebral artery is poorly opacified, which could relate to stenosis or non dominant vertebral artery. Otherwise, no evidence of proximal large vessel occlusion or large aneurysm in the anterior or posterior circulation. CT VENOGRAM No evidence of dural sinus thrombosis. Small left transverse sinus and narrowing of the distal right transverse sinus. Small rounded filling defect in the distal right transverse sinus likely represents arachnoid granulation. IMPRESSION: CT head: 1. No definite evidence of acute intracranial abnormality. 2. Mild narrowing of the mamillopontine distance and effacement of the suprasellar cistern with trace tonsillar ectopia. Given the patient is young and has relative paucity of CSF spaces/sulci diffusely these findings may be within normal limits, but  recommend correlation with signs/symptoms of intracranial hypotension including positional headaches. Additionally, per Dr. Rush Landmark the patient is going to get a follow-up MRI which can further evaluate these findings. CTA head: Due to venous timing, significantly limited evaluation. The intradural left vertebral artery is not well opacified, which is poorly evaluated and could relate to nondominant vertebral artery or stenosis. A CTA neck could further evaluate if clinically indicated. Otherwise, no evidence of proximal large vessel occlusion or large aneurysm. A CTA of the neck could further evaluate the vertebral finding and  a repeat CTA head could provide more sensitive intracranial arterial evaluation if clinically indicated. CTV: No evidence of dural sinus thrombosis. Findings discussed with Dr. Julieanne Manson at 3:05 PM via telephone. Electronically Signed   By: Feliberto Harts MD   On: 01/27/2021 15:15   DG Chest 2 View  Result Date: 02/16/2021 CLINICAL DATA:  Shortness of breath. EXAM: CHEST - 2 VIEW COMPARISON:  January 27, 2021. FINDINGS: The heart size and mediastinal contours are within normal limits. Both lungs are clear. No pneumothorax or pleural effusion is noted. The visualized skeletal structures are unremarkable. IMPRESSION: No active cardiopulmonary disease. Electronically Signed   By: Lupita Raider M.D.   On: 02/16/2021 12:53   US Renal  Result Date: 02/16/2021 CLINICAL DATA:  Renal failure EXAM: RENAL / URINARY TRACT ULTRASOUND COMPLETE COMPARISON:  CT 02/16/2021 FINDINGS: Right Kidney: Renal measurements: 11.9 x 5.8 x 4.4 cm = volume: 159.2 mL. Cortex is echogenic. No mass or hydronephrosis. Trace perinephric fluid Left Kidney: Renal measurements: 9.7 x 5.2 x 4.9 cm = volume: 129.5 mL. Cortex is echogenic. No mass or hydronephrosis. Bladder: Empty by Foley catheter. Other: None. IMPRESSION: Echogenic kidneys bilaterally consistent with medical renal disease. No hydronephrosis  Electronically Signed   By: Jasmine Pang M.D.   On: 02/16/2021 20:23   IR Fluoro Guide CV Line Right  Result Date: 02/18/2021 INDICATION: 55 year old female referred for temporary hemodialysis catheter placement EXAM: IMAGE GUIDED PLACEMENT OF TEMPORARY HEMODIALYSIS CATHETER MEDICATIONS: None ANESTHESIA/SEDATION: Moderate (conscious) sedation was employed during this procedure. A total of Versed 1.0 mg and Fentanyl 50 mcg was administered intravenously. Moderate Sedation Time: 13 minutes. The patient's level of consciousness and vital signs were monitored continuously by radiology nursing throughout the procedure under my direct supervision. FLUOROSCOPY TIME:  Fluoroscopy Time: 0 minutes 6 seconds (1 mGy). COMPLICATIONS: None PROCEDURE: Informed written consent was obtained from the patient and the patient's family after a thorough discussion of the procedural risks, benefits and alternatives. All questions were addressed. A timeout was performed prior to the initiation of the procedure. The right neck and chest was prepped with chlorhexidine, and draped in the usual sterile fashion using maximum barrier technique (cap and mask, sterile gown, sterile gloves, large sterile sheet, hand hygiene and cutaneous antiseptic). Local anesthesia was attained by infiltration with 1% lidocaine without epinephrine. Ultrasound demonstrated patency of the right internal jugular vein, and this was documented with an image. Under real-time ultrasound guidance, this vein was accessed with a 21 gauge micropuncture needle and image documentation was performed. A small dermatotomy was made at the access site with an 11 scalpel. A 0.018" wire was advanced into the SVC and the access needle exchanged for a 25F micropuncture vascular sheath. The 0.018" wire was then removed and a 0.035" wire advanced into the IVC. Upon withdrawal of the 018 wire, the wire was marked for appropriate length of the internal portion of the catheter. A 16 cm  catheter was selected. Skin and subcutaneous tissues were serially dilated. Catheter was placed on the wire. The catheter tip is positioned in the upper right atrium. This was documented with a spot image. Both ports of the hemodialysis catheter were then tested for excellent function. The ports were then locked with heparinized lock. Patient tolerated the procedure well and remained hemodynamically stable throughout. No complications were encountered and no significant blood loss was encountered. IMPRESSION: Status post image guided placement of temporary hemodialysis catheter. Catheter may be converted if needed. Signed, Yvone Neu. Loreta Ave, DO, RPVI  [facility] Electronically Signed   By: Gilmer Mor D.O.   On: 02/18/2021 14:21   IR US Guide Vasc Access Right  Result Date: 02/18/2021 INDICATION: 55 year old female referred for temporary hemodialysis catheter placement EXAM: IMAGE GUIDED PLACEMENT OF TEMPORARY HEMODIALYSIS CATHETER MEDICATIONS: None ANESTHESIA/SEDATION: Moderate (conscious) sedation was employed during this procedure. A total of Versed 1.0 mg and Fentanyl 50 mcg was administered intravenously. Moderate Sedation Time: 13 minutes. The patient's level of consciousness and vital signs were monitored continuously by radiology nursing throughout the procedure under my direct supervision. FLUOROSCOPY TIME:  Fluoroscopy Time: 0 minutes 6 seconds (1 mGy). COMPLICATIONS: None PROCEDURE: Informed written consent was obtained from the patient and the patient's family after a thorough discussion of the procedural risks, benefits and alternatives. All questions were addressed. A timeout was performed prior to the initiation of the procedure. The right neck and chest was prepped with chlorhexidine, and draped in the usual sterile fashion using maximum barrier technique (cap and mask, sterile gown, sterile gloves, large sterile sheet, hand hygiene and  cutaneous antiseptic). Local anesthesia was attained by infiltration with 1% lidocaine without epinephrine. Ultrasound demonstrated patency of the right internal jugular vein, and this was documented with an image. Under real-time ultrasound guidance, this vein was accessed with a 21 gauge micropuncture needle and image documentation was performed. A small dermatotomy was made at the access site with an 11 scalpel. A 0.018" wire was advanced into the SVC and the access needle exchanged for a 76F micropuncture vascular sheath. The 0.018" wire was then removed and a 0.035" wire advanced into the IVC. Upon withdrawal of the 018 wire, the wire was marked for appropriate length of the internal portion of the catheter. A 16 cm catheter was selected. Skin and subcutaneous tissues were serially dilated. Catheter was placed on the wire. The catheter tip is positioned in the upper right atrium. This was documented with a spot image. Both ports of the hemodialysis catheter were then tested for excellent function. The ports were then locked with heparinized lock. Patient tolerated the procedure well and remained hemodynamically stable throughout. No complications were encountered and no significant blood loss was encountered. IMPRESSION: Status post image guided placement of temporary hemodialysis catheter. Catheter may be converted if needed. Signed, Yvone Neu. Reyne Dumas, RPVI [facility] Electronically Signed   By: Gilmer Mor D.O.   On: 02/18/2021 14:21   DG Chest Portable 1 View  Result Date: 01/27/2021 CLINICAL DATA:  Persistent cough. Recurrent upper respiratory infection EXAM: PORTABLE CHEST 1 VIEW COMPARISON:  Radiograph 06/27/2016 FINDINGS: 1355 hours. The heart size and mediastinal contours are normal. The lungs are clear. There is no pleural effusion or pneumothorax. No acute osseous findings are identified. Telemetry leads overlie the chest. IMPRESSION:  No active cardiopulmonary process. Electronically Signed   By: Carey Bullocks M.D.   On: 01/27/2021 14:47   ECHOCARDIOGRAM COMPLETE  Result Date: 02/17/2021    ECHOCARDIOGRAM REPORT   Patient Name:   ZAIAH CREDEUR Date of Exam: 02/17/2021 Medical Rec #:  [id number]       Height:       65.0 in Accession #:    [id number]      Weight:       240.0 lb Date of Birth:  Jul 07, 1966        BSA:          2.138 m Patient Age:    51 years  BP:           120/60 mmHg Patient Gender: F               HR:           67 bpm. Exam Location:  Inpatient Procedure: 2D Echo Indications:    CHF-Acute Diastolic I50.31  History:        Patient has no prior history of Echocardiogram examinations.  Sonographer:    Thurman Coyerasey Kirkpatrick RDCS (AE) Referring Phys: [id number] ScotlandRASHANT K [facility] IMPRESSIONS  1. Left ventricular ejection fraction, by estimation, is 60%. The left ventricle has normal function. The left ventricle has no regional wall motion abnormalities. There is mild left ventricular hypertrophy. Left ventricular diastolic parameters were low normal.  2. Right ventricular systolic function is normal. The right ventricular size is normal. Tricuspid regurgitation signal is inadequate for assessing PA pressure.  3. The mitral valve is normal in structure. Trivial mitral valve regurgitation. No evidence of mitral stenosis.  4. The aortic valve is tricuspid. Aortic valve regurgitation is not visualized. No aortic stenosis is present.  5. The inferior vena cava is normal in size with greater than 50% respiratory variability, suggesting right atrial pressure of 3 mmHg. FINDINGS  Left Ventricle: Left ventricular ejection fraction, by estimation, is 60%. The left ventricle has normal function. The left ventricle has no regional wall motion abnormalities. The left ventricular internal cavity size was normal in size. There is mild left ventricular hypertrophy. Left ventricular diastolic parameters were normal. Right Ventricle: The right  ventricular size is normal. No increase in right ventricular wall thickness. Right ventricular systolic function is normal. Tricuspid regurgitation signal is inadequate for assessing PA pressure. Left Atrium: Left atrial size was normal in size. Right Atrium: Right atrial size was normal in size. Pericardium: There is no evidence of pericardial effusion. Mitral Valve: The mitral valve is normal in structure. Trivial mitral valve regurgitation. No evidence of mitral valve stenosis. Tricuspid Valve: The tricuspid valve is normal in structure. Tricuspid valve regurgitation is trivial. No evidence of tricuspid stenosis. Aortic Valve: The aortic valve is tricuspid. Aortic valve regurgitation is not visualized. No aortic stenosis is present. Pulmonic Valve: The pulmonic valve was normal in structure. Pulmonic valve regurgitation is trivial. No evidence of pulmonic stenosis. Aorta: The aortic root is normal in size and structure. Venous: The inferior vena cava is normal in size with greater than 50% respiratory variability, suggesting right atrial pressure of 3 mmHg. IAS/Shunts: The interatrial septum was not well visualized.  LEFT VENTRICLE PLAX 2D LVIDd:         4.10 cm  Diastology LVIDs:         2.80 cm  LV e' medial:    6.53 cm/s LV PW:         1.20 cm  LV E/e' medial:  11.0 LV IVS:        1.20 cm  LV e' lateral:   9.57 cm/s LVOT diam:     2.10 cm  LV E/e' lateral: 7.5 LV SV:         76 LV SV Index:   36 LVOT Area:     3.46 cm  RIGHT VENTRICLE RV S prime:     15.40 cm/s TAPSE (M-mode): 1.4 cm LEFT ATRIUM             Index       RIGHT ATRIUM          Index LA diam:  3.10 cm 1.45 cm/m  RA Area:     9.08 cm LA Vol (A2C):   27.6 ml 12.91 ml/m RA Volume:   15.80 ml 7.39 ml/m LA Vol (A4C):   39.6 ml 18.53 ml/m LA Biplane Vol: 35.3 ml 16.51 ml/m  AORTIC VALVE LVOT Vmax:   107.00 cm/s LVOT Vmean:  64.800 cm/s LVOT VTI:    0.220 m  AORTA Ao Root diam: 2.80 cm MITRAL VALVE MV Area (PHT): 2.42 cm    SHUNTS MV Decel  Time: 313 msec    Systemic VTI:  0.22 m MV E velocity: 71.60 cm/s  Systemic Diam: 2.10 cm MV A velocity: 73.30 cm/s MV E/A ratio:  0.98 Weston Brass MD Electronically signed by Weston Brass MD Signature Date/Time: 02/17/2021/1:13:33 PM    Final    CT VENOGRAM HEAD  Result Date: 01/27/2021 CLINICAL DATA:  Diplopia.  Dural sinus thrombosis suspected. EXAM: CT ANGIOGRAPHY HEAD CT VENOGRAM HEAD TECHNIQUE: Multidetector CT imaging of the head was performed using the standard protocol during bolus administration of intravenous contrast. Multiplanar CT image reconstructions and MIPs were obtained to evaluate the arterial anatomy. Additional delayed multidetector CT imaging of the head was performed with multiplanar CT image reconstructions and MIPS to evaluate the venous anatomy. CONTRAST:  OMNIPAQUE IOHEXOL 350 MG/ML SOLN COMPARISON:  None. FINDINGS: CT HEAD Brain: No evidence of acute large vascular territory infarction, acute hemorrhage, hydrocephalus, extra-axial collection or mass lesion/mass effect. Mild narrowing of the mamillopontine distance and effacement of the suprasellar cistern with trace tonsillar ectopia. Skull: No acute fracture. Sinuses: Visualized sinuses are clear. Orbits: No acute finding. CTA HEAD Due to venous timing, significantly limited evaluation. The intradural left vertebral artery is poorly opacified, which could relate to stenosis or non dominant vertebral artery. Otherwise, no evidence of proximal large vessel occlusion or large aneurysm in the anterior or posterior circulation. CT VENOGRAM No evidence of dural sinus thrombosis. Small left transverse sinus and narrowing of the distal right transverse sinus. Small rounded filling defect in the distal right transverse sinus likely represents arachnoid granulation. IMPRESSION: CT head: 1. No definite evidence of acute intracranial abnormality. 2. Mild narrowing of the mamillopontine distance and effacement of the suprasellar  cistern with trace tonsillar ectopia. Given the patient is young and has relative paucity of CSF spaces/sulci diffusely these findings may be within normal limits, but recommend correlation with signs/symptoms of intracranial hypotension including positional headaches. Additionally, per Dr. Rush Landmark the patient is going to get a follow-up MRI which can further evaluate these findings. CTA head: Due to venous timing, significantly limited evaluation. The intradural left vertebral artery is not well opacified, which is poorly evaluated and could relate to nondominant vertebral artery or stenosis. A CTA neck could further evaluate if clinically indicated. Otherwise, no evidence of proximal large vessel occlusion or large aneurysm. A CTA of the neck could further evaluate the vertebral finding and a repeat CTA head could provide more sensitive intracranial arterial evaluation if clinically indicated. CTV: No evidence of dural sinus thrombosis. Findings discussed with Dr. Julieanne Manson at 3:05 PM via telephone. Electronically Signed   By: Feliberto Harts MD   On: 01/27/2021 15:15

## 2021-02-21 ENCOUNTER — Inpatient Hospital Stay (HOSPITAL_COMMUNITY): Payer: BC Managed Care – PPO

## 2021-02-21 DIAGNOSIS — N179 Acute kidney failure, unspecified: Secondary | ICD-10-CM | POA: Diagnosis not present

## 2021-02-21 LAB — RENAL FUNCTION PANEL
Albumin: 1.8 g/dL — ABNORMAL LOW (ref 3.5–5.0)
Anion gap: 14 (ref 5–15)
BUN: 98 mg/dL — ABNORMAL HIGH (ref 6–20)
CO2: 20 mmol/L — ABNORMAL LOW (ref 22–32)
Calcium: 7.9 mg/dL — ABNORMAL LOW (ref 8.9–10.3)
Chloride: 97 mmol/L — ABNORMAL LOW (ref 98–111)
Creatinine, Ser: 11.78 mg/dL — ABNORMAL HIGH (ref 0.44–1.00)
GFR, Estimated: 3 mL/min — ABNORMAL LOW (ref >60)
Glucose, Bld: 144 mg/dL — ABNORMAL HIGH (ref 70–99)
Phosphorus: 8.6 mg/dL — ABNORMAL HIGH (ref 2.5–4.6)
Potassium: 4.8 mmol/L (ref 3.5–5.1)
Sodium: 131 mmol/L — ABNORMAL LOW (ref 135–145)

## 2021-02-21 LAB — CBC
HCT: 33.8 % — ABNORMAL LOW (ref 36.0–46.0)
Hemoglobin: 11 g/dL — ABNORMAL LOW (ref 12.0–15.0)
MCH: 29.3 pg (ref 26.0–34.0)
MCHC: 32.5 g/dL (ref 30.0–36.0)
MCV: 89.9 fL (ref 80.0–100.0)
Platelets: 143 10*3/uL — ABNORMAL LOW (ref 150–400)
RBC: 3.76 MIL/uL — ABNORMAL LOW (ref 3.87–5.11)
RDW: 13.8 % (ref 11.5–15.5)
WBC: 9.7 10*3/uL (ref 4.0–10.5)
nRBC: 0 % (ref 0.0–0.2)

## 2021-02-21 LAB — TYPE AND SCREEN
ABO/RH(D): B POS
Antibody Screen: NEGATIVE

## 2021-02-21 LAB — ABO/RH: ABO/RH(D): B POS

## 2021-02-21 MED ORDER — PENTAFLUOROPROP-TETRAFLUOROETH EX AERO: 1.0000 "application " | INHALATION_SPRAY | CUTANEOUS | Status: DC | PRN

## 2021-02-21 MED ORDER — HEPARIN SODIUM (PORCINE) 1000 UNIT/ML IJ SOLN
INTRAMUSCULAR | Status: AC
  Administered 2021-02-21: 1000 [IU] via INTRAVENOUS_CENTRAL
  Filled 2021-02-21: qty 4

## 2021-02-21 MED ORDER — LIDOCAINE-PRILOCAINE 2.5-2.5 % EX CREA
1.0000 "application " | TOPICAL_CREAM | CUTANEOUS | Status: DC | PRN
  Filled 2021-02-21: qty 5

## 2021-02-21 MED ORDER — SODIUM CHLORIDE 0.9 % IV SOLN: 100.0000 mL | INTRAVENOUS | Status: DC | PRN

## 2021-02-21 MED ORDER — HEPARIN SODIUM (PORCINE) 1000 UNIT/ML DIALYSIS: 1000.0000 [IU] | INTRAMUSCULAR | Status: DC | PRN

## 2021-02-21 MED ORDER — ALTEPLASE 2 MG IJ SOLR: 2.0000 mg | Freq: Once | INTRAMUSCULAR | Status: DC | PRN

## 2021-02-21 MED ORDER — HYDROMORPHONE HCL 1 MG/ML IJ SOLN
INTRAMUSCULAR | Status: AC
  Filled 2021-02-21: qty 1

## 2021-02-21 MED ORDER — TRAMADOL HCL 50 MG PO TABS
50.0000 mg | ORAL_TABLET | Freq: Two times a day (BID) | ORAL | Status: DC | PRN
  Administered 2021-02-21 – 2021-02-25 (×5): 50 mg via ORAL
  Filled 2021-02-21 (×6): qty 1

## 2021-02-21 MED ORDER — HYDROMORPHONE HCL 1 MG/ML IJ SOLN
1.0000 mg | Freq: Once | INTRAMUSCULAR | Status: AC
  Administered 2021-02-21: 1 mg via INTRAVENOUS

## 2021-02-21 MED ORDER — LIDOCAINE HCL (PF) 1 % IJ SOLN
5.0000 mL | INTRAMUSCULAR | Status: DC | PRN
  Filled 2021-02-21: qty 5

## 2021-02-21 NOTE — Procedures (Signed)
I was present at this dialysis session. I have reviewed the session itself and made appropriate changes.   Seen earlier in her room and again on HD. She reports that her flank pain is worsening, feels like she is having abdominal pain as well. Will obtain a stat renal ultrasound to make sure she is not developing an enlarging hematoma. Less likely to be this especially as her hgb is stable today.  Filed Weights   02/16/21 1543 02/21/21 1243  Weight: 108.9 kg (!) 136.9 kg    Recent Labs  Lab 02/21/21 1137  NA 131*  K 4.8  CL 97*  CO2 20*  GLUCOSE 144*  BUN 98*  CREATININE 11.78*  CALCIUM 7.9*  PHOS 8.6*    Recent Labs  Lab 02/18/21 0230 02/18/21 1420 02/19/21 0206 02/20/21 0310 02/21/21 1137  WBC 3.1*   < > 5.3 7.0 9.7  NEUTROABS 1.6*  --  4.1 5.2  --   HGB 10.8*   < > 11.4* 10.8* 11.0*  HCT 32.2*   < > 32.9* 32.1* 33.8*  MCV 88.5   < > 87.5 88.7 89.9  PLT 190   < > 100* 93* 143*   < > = values in this interval not displayed.    Scheduled Meds: . Chlorhexidine Gluconate Cloth  6 each Topical Q0600  . famotidine  10 mg Oral Daily  . predniSONE  60 mg Oral Q breakfast   Continuous Infusions: . sodium chloride    . sodium chloride     PRN Meds:.sodium chloride, sodium chloride, acetaminophen **OR** [DISCONTINUED] acetaminophen, alteplase, alum & mag hydroxide-simeth, bisacodyl, heparin, hydrALAZINE, lidocaine (PF), lidocaine-prilocaine, [DISCONTINUED] ondansetron **OR** ondansetron (ZOFRAN) IV, pentafluoroprop-tetrafluoroeth, phenol, sodium chloride flush, traMADol   Anthony Sar, MD [facility] 02/21/2021, 1:05 PM

## 2021-02-21 NOTE — Progress Notes (Signed)
PT Cancellation Note  Patient Details Name: Kelvin Burpee MRN: [id number] DOB: [birth year]   Cancelled Treatment:    Reason Eval/Treat Not Completed: Patient at procedure or test/unavailable this afternoon, at HD. PT will continue to follow and progress as time/schedule allow.   Deland Pretty, DPT   Acute Rehabilitation Department Pager #: [phone]   [name] 02/21/2021, 12:48 PM

## 2021-02-21 NOTE — Progress Notes (Signed)
[facility] PROGRESS NOTE  Assessment/ Plan: Pt is a 55 y.o. yo female with history of psoriasis, COVID-19 infection, recent diagnosis of HTN restarted HCTZ presented with generalized body pain, shortness of breath and found to have AKI.  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 The urine study with proteinuria around 6g, serum albumin 1.5 and trace edema. No microscopic hematuria or WBC in the first urinalysis.   The repeat urinalysis was after placement of Foley catheter.  Kidney ultrasound without hydronephrosis. The lab result came back with elevated antidsDNA >300 with low C3 and C4 and high ANA titers. Hep B, C, HIV, ANCA, ASO negative. -s/p biopsy 3/21, awaiting results -We started solumedrol 250 mg IV on 3/19 for 3 days and now on prednisone 60 mg as of 3/22, plan to taper in 2 weeks. Further treatment including CellCept, voclosporin, cytoxan depending on the biopsy result. Continue pepcid 10 mg daily. -Started dialysis on 3/19 for oliguric AKI and azotemia. RIJ temp HD catheter placed by IR. -HD today, next treatment Thursday  #Hypertension: Blood pressure acceptable.  Continue to hold HCTZ.  #Exertional shortness of breath: Chest x-ray negative.    Echo with EF of 60%.  UF with HD.  #Sore throat: Reportedly she was treated with penicillin by her PCP.    ASO titer not elevated.  Subjective: Seen and examined at bedside.  Having some back soreness after biopsy yesterday which she tolerated well. Due for HD today  Objective Vital signs in last 24 hours: Vitals:   02/20/21 1610 02/20/21 1709 02/20/21 2024 02/21/21 0351  BP: 130/80 (!) 113/57 139/73 131/76  Pulse: 71 67 61 (!) 56  Resp: 18 17 18 18   Temp:  98.1 F (36.7 C) 97.7 F (36.5 C) (!) 97.4 F (36.3 C)  TempSrc:  Oral Oral Oral  SpO2: 94% 99% 97% 95%   Weight:      Height:       Weight change:   Intake/Output Summary (Last 24 hours) at 02/21/2021 1223 Last data filed at 02/20/2021 2032 Gross per 24 hour  Intake --  Output 75 ml  Net -75 ml       Labs: Basic Metabolic Panel: Recent Labs  Lab 02/18/21 1420 02/19/21 0206 02/20/21 0310  NA 132* 131* 130*  K 4.8 4.5 4.5  CL 98 97* 97*  CO2 18* 20* 22  GLUCOSE 103* 139* 152*  BUN 119* 81* 71*  CREATININE 12.28* 9.57* 9.47*  CALCIUM 7.2* 7.1* 7.7*  PHOS 9.0*  --   --    Liver Function Tests: Recent Labs  Lab 02/18/21 0230 02/18/21 1420 02/19/21 0206 02/20/21 0310  AST 132*  --  110* 163*  ALT 65*  --  55* 81*  ALKPHOS 69  --  74 78  BILITOT 1.1  --  0.5 0.6  PROT 4.8*  --  4.8* 5.3*  ALBUMIN 1.4* 1.5* 1.3* 1.6*   No results for input(s): LIPASE, AMYLASE in the last 168 hours. No results for input(s): AMMONIA in the last 168 hours. CBC: Recent Labs  Lab 02/17/21 0047 02/18/21 0230 02/18/21 1420 02/19/21 0206 02/20/21 0310  WBC 3.2* 3.1* 3.3* 5.3 7.0  NEUTROABS 2.6 1.6*  --  4.1 5.2  HGB 11.8* 10.8* 12.1 11.4* 10.8*  HCT 36.5 32.2* 35.7* 32.9* 32.1*  MCV 89.7 88.5 88.4 87.5 88.7  PLT 214 190 254  100* 93*   Cardiac Enzymes: Recent Labs  Lab 02/17/21 0802  CKTOTAL 333*   CBG: No results for input(s): GLUCAP in the last 168 hours.  Iron Studies: No results for input(s): IRON, TIBC, TRANSFERRIN, FERRITIN in the last 72 hours. Studies/Results: US BIOPSY (KIDNEY)  Result Date: 02/20/2021 INDICATION: 40 year old with acute kidney injury and request for renal biopsy. EXAM: ULTRASOUND-GUIDED RANDOM RENAL BIOPSY MEDICATIONS: Moderate sedation none. ANESTHESIA/SEDATION: Moderate (conscious) sedation was employed during this procedure. A total of Versed 1.0 mg and Fentanyl 50 mcg was administered intravenously. Moderate Sedation Time: 15 minutes. The patient's level of consciousness and vital signs were monitored continuously by radiology nursing throughout  the procedure under my direct supervision. FLUOROSCOPY TIME:  None COMPLICATIONS: None immediate. PROCEDURE: Informed written consent was obtained from the patient after a thorough discussion of the procedural risks, benefits and alternatives. All questions were addressed. A timeout was performed prior to the initiation of the procedure. Patient was placed prone. Both kidneys were evaluated with ultrasound. The right kidney was targeted for biopsy. The right flank was prepped with chlorhexidine and sterile field was created. Maximal barrier sterile technique was utilized including caps, mask, sterile gowns, sterile gloves, sterile drape, hand hygiene and skin antiseptic. Skin was anesthetized using 1% lidocaine. A small incision was made. Using ultrasound guidance, 16 gauge core biopsy was directed into the right kidney lower pole. Adequate specimen was obtained and placed in saline. A second ultrasound-guided core biopsy was obtained from the right kidney lower pole. Adequate specimen was obtained and placed in saline. Bandage placed over the puncture site. FINDINGS: Negative for hydronephrosis. Two core biopsies obtained from the right kidney lower pole. No significant bleeding or hematoma formation. IMPRESSION: Successful ultrasound-guided random core biopsies from the right kidney lower pole. Electronically Signed   By: Richarda Overlie M.D.   On: 02/20/2021 17:20   US Abdomen Limited RUQ (LIVER/GB)  Result Date: 02/20/2021 CLINICAL DATA:  Transaminitis EXAM: ULTRASOUND ABDOMEN LIMITED RIGHT UPPER QUADRANT COMPARISON:  CT abdomen and pelvis 02/16/2021 FINDINGS: Gallbladder: Normally distended. 6 mm nonshadowing intraluminal echogenic focus likely small polyp; no follow-up imaging recommended. No gallbladder wall thickening, pericholecystic fluid or sonographic Murphy sign. No discrete shadowing calculi. Common bile duct: Diameter: 2 mm Liver: Slightly heterogeneous echogenicity, nonspecific. No discrete hepatic  mass or nodularity. No intrahepatic biliary dilatation. Portal vein is patent on color Doppler imaging with normal direction of blood flow towards the liver. Other: No RIGHT upper quadrant free fluid. IMPRESSION: 6 mm gallbladder polyp; no follow-up imaging recommended. Nonspecific mildly heterogeneous hepatic parenchymal echogenicity without mass or biliary dilatation. Electronically Signed   By: Ulyses Southward M.D.   On: 02/20/2021 18:13    Medications: Infusions: . sodium chloride    . sodium chloride      Scheduled Medications: . Chlorhexidine Gluconate Cloth  6 each Topical Q0600  . famotidine  10 mg Oral Daily  . predniSONE  60 mg Oral Q breakfast   I have reviewed scheduled and prn medications.  Physical Exam: General:NAD, comfortable Heart:RRR, s1s2 nl Lungs: decreased breath sounds bibasilar, no increase work of breathing Abdomen:soft, Non-tender, non-distended Extremities: Trace LE edema b/l Dialysis Access: Right IJ temporary HD catheter site looks clean  Leeanne Butters 02/21/2021,12:23 PM  LOS: 5 days

## 2021-02-21 NOTE — Plan of Care (Signed)

## 2021-02-21 NOTE — Progress Notes (Signed)
Hemodialysis- Tolerated well. UF 2.5L. Goal was reduced due to tachycardia 120-130s sustained x1, relieved with saline bolus and uf off. Patient currently denies pain. Report called to primary RN.

## 2021-02-21 NOTE — Progress Notes (Signed)
Patient returned from renal US. RN to resume care

## 2021-02-21 NOTE — Progress Notes (Signed)
Patient transported off unit for renal ultrasound. VSS. Pt AOX4

## 2021-02-21 NOTE — Progress Notes (Signed)
PROGRESS NOTE                                                                                                                                                                                                             Patient Demographics:    Denise Watson, is a 55 y.o. female, DOB - October 23, 1966, FOY:[id number]  Outpatient Primary MD for the patient is Piedad Climes, [place]    LOS - 5  Admit date - 02/16/2021    Chief Complaint  Patient presents with  . Shortness of Breath       Brief Narrative (HPI from H&P) -  Denise Watson  is a 55 y.o. female, who is in relatively good health except recent diagnosis of essential hypertension requiring HCTZ for the last few weeks, obesity, COVID-19 infection in January, psoriasis, C-section in the past, intermittent palpitations in the past.  Apparently patient has been having symptoms of sore throat for the last 2 weeks and subsequently has been developing gradual fatigue and some exertional shortness of breath, denies any fever chills, no chest or abdominal pain, no blood in stool or urine, no cough or sinus pressure, no swelling in arms or legs, she does have petechial rash but she says this is due to her psoriasis and is chronic.  She presented to the ER where she was found to have AKI with a creatinine of 10, nephrology was consulted and I was requested to admit the patient.  Currently review of systems positive for sore throat, fatigue, exertional shortness of breath, chronic petechial rash due to psoriasis according to the patient.  Other review of systems are negative   Subjective:   Patient in bed, appears comfortable, denies any headache, no fever, no chest pain or pressure, no shortness of breath , no abdominal pain. No focal weakness.   Assessment  & Plan :      1.  Generalized fatigue, exertional shortness of breath and pharyngitis.  In a patient with AKI, she was  recently diagnosed with hypertension and started with HCTZ as well.  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  Right kidney biopsy done on 02/24/2019 with results awaited.   2.  Petechial rash.  Patient claims this is chronic due to psoriasis but could be due to platelet dysfunction caused by AKI.  Will monitor.  Platelet counts are stable pressure stable.  3.  Borderline hyperkalemia.  Stable after kayaxalate and HD.  4.  Exertional shortness of breath, likely due to AKI, stable TTE. HD now for any extra fluid removal.    5.  Obesity.  BMI 39.  Follow with PCP.  6. Hypertension.  Gentle IV hydralazine, will avoid hypotension due to AKI.  7.  Pharyngitis.  Strep throat culture negative, strep urinary antigen negative, ASO titer negative, likely viral pharyngitis  8.  History of psoriasis.  Question if she has discoid lupus.  Outpatient dermatology follow-up.  9.  Asymptomatic mild rise in LFTs.  Would be due to hepatic congestion along with acute hepatitis panel negative, she is symptom-free.Stable RUQ [place] with incidental gallbladder polyp, outpatient follow-up with PCP. Trend LFTs.       Condition -   Guarded  Family Communication  : Husband Reuel Boom [phone]  02/17/2021  Code Status :  Full  Consults  :  Renal  PUD Prophylaxis :    Procedures  :     RUQ US - GB Polyp  R. Kidney biopsy 02/20/21  R.IJ HD Cath by IR 02/18/21  Renal US - medical renal disease.  TTE - 1. Left ventricular ejection fraction, by estimation, is 60%. The left ventricle has normal function. The left ventricle has no regional wall motion abnormalities. There is mild left ventricular hypertrophy. Left ventricular diastolic parameters were low normal.  2. Right ventricular systolic function is normal. The  right ventricular size is normal. Tricuspid regurgitation signal is inadequate for assessing PA pressure.  3. The mitral valve is normal in structure. Trivial mitral valve regurgitation. No evidence of mitral stenosis.  4. The aortic valve is tricuspid. Aortic valve regurgitation is not visualized. No aortic stenosis is present.  5. The inferior vena cava is normal in size with greater than 50% respiratory variability, suggesting right atrial pressure of 3 mmHg.       Disposition Plan  :    Status is: Inpatient  Remains inpatient appropriate because:IV treatments appropriate due to intensity of illness or inability to take PO   Dispo: The patient is from: Home              Anticipated d/c is to: Home              Patient currently is not medically stable to d/c.   Difficult to place patient No  DVT Prophylaxis  :  Heparin   Lab Results  Component Value Date   PLT 93 (L) 02/20/2021    Diet :  Diet Order            Diet renal/carb modified with fluid restriction Diet-HS Snack? Nothing; Fluid restriction: Other (see comments); Room service appropriate? Yes; Fluid consistency: Thin  Diet effective now                  Inpatient Medications  Scheduled Meds: . Chlorhexidine Gluconate Cloth  6 each Topical Q0600  . famotidine  10 mg Oral Daily  . predniSONE  60 mg Oral Q breakfast   Continuous Infusions: . sodium chloride    . sodium chloride     PRN Meds:.sodium chloride, sodium chloride, acetaminophen **OR** [DISCONTINUED] acetaminophen, alteplase, alum & mag hydroxide-simeth, bisacodyl, heparin, hydrALAZINE, lidocaine (PF), lidocaine-prilocaine, [DISCONTINUED] ondansetron **OR** ondansetron (ZOFRAN) IV, pentafluoroprop-tetrafluoroeth, phenol, sodium  chloride flush, traMADol  Antibiotics  :    Anti-infectives (From admission, onward)   None       Time Spent in minutes  30   Susa Raring M.D on 02/21/2021 at 11:32 AM  To page go to [email or web address]   [facility] -  Office  [phone]   See all Orders from today for further details    Objective:   Vitals:   02/20/21 1610 02/20/21 1709 02/20/21 2024 02/21/21 0351  BP: 130/80 (!) 113/57 139/73 131/76  Pulse: 71 67 61 (!) 56  Resp: Temp:  98.1 F (36.7 C) 97.7 F (36.5 C) (!) 97.4 F (36.3 C)  TempSrc:  Oral Oral Oral  SpO2: 94% 99% 97% 95%  Weight:      Height:        Wt Readings from Last 3 Encounters:  02/16/21 108.9 kg  01/27/21 108.9 kg  06/27/16 132 kg     Intake/Output Summary (Last 24 hours) at 02/21/2021 1132 Last data filed at 02/20/2021 2032 Gross per 24 hour  Intake --  Output 75 ml  Net -75 ml     Physical Exam  Awake Alert, No new F.N deficits, Normal affect [place].AT,PERRAL Supple Neck,No JVD, No cervical lymphadenopathy appriciated.  Symmetrical Chest wall movement, Good air movement bilaterally, CTAB RRR,No Gallops, Rubs or new Murmurs, No Parasternal Heave +ve B.Sounds, Abd Soft, No tenderness, No organomegaly appriciated, No rebound - guarding or rigidity. No Cyanosis, improving pharyngitis, petechial rash and discoid rash, few crackles, foley, R IJ HD Cath    Data Review:    CBC Recent Labs  Lab 02/17/21 0047 02/18/21 0230 02/18/21 1420 02/19/21 0206 02/20/21 0310  WBC 3.2* 3.1* 3.3* 5.3 7.0  HGB 11.8* 10.8* 12.1 11.4* 10.8*  HCT 36.5 32.2* 35.7* 32.9* 32.1*  PLT 214 190 254 100* 93*  MCV 89.7 88.5 88.4 87.5 88.7  MCH 29.0 29.7 30.0 30.3 29.8  MCHC 32.3 33.5 33.9 34.7 33.6  RDW 13.4 13.6 13.8 13.6 13.6  LYMPHSABS 0.4* 1.1  --  1.0 1.1  MONOABS 0.2 0.3  --  0.2 0.6  EOSABS 0.0 0.0  --  0.0 0.0  BASOSABS 0.0 0.0  --  0.0 0.0    Recent Labs  Lab 02/16/21 1940 02/17/21 0047 02/18/21 0230 02/18/21 1420 02/19/21 0206 02/20/21 0310  NA  --  131* 131* 132* 131* 130*  K  --  4.1 4.1 4.8 4.5 4.5  CL  --  97* 99 98 97* 97*  CO2  --  21* 18* 18* 20* 22  GLUCOSE  --  105* 99 103* 139* 152*  BUN  --  105* 114* 119*  81* 71*  CREATININE  --  10.53* 11.93* 12.28* 9.57* 9.47*  CALCIUM  --  7.4* 7.1* 7.2* 7.1* 7.7*  AST 123* 127* 132*  --  110* 163*  ALT 62* 63* 65*  --  55* 81*  ALKPHOS 69 68 69  --  74 78  BILITOT 1.3* 1.3* 1.1  --  0.5 0.6  ALBUMIN 1.5* 1.5* 1.4* 1.5* 1.3* 1.6*  MG  --  2.3 2.3  --  2.0 2.2  INR  --  1.0  --   --   --   --   TSH 1.836  --   --   --   --   --   HGBA1C 6.3*  --   --   --   --   --   BNP  --  17.6 13.6  --  23.5 34.5    ------------------------------------------------------------------------------------------------------------------ No results for input(s): CHOL, HDL, LDLCALC, TRIG, CHOLHDL, LDLDIRECT in the last 72 hours.  Lab Results  Component Value Date   HGBA1C 6.3 (H) 02/16/2021   ------------------------------------------------------------------------------------------------------------------ No results for input(s): TSH, T4TOTAL, T3FREE, THYROIDAB in the last 72 hours.  Invalid input(s): FREET3  Cardiac Enzymes No results for input(s): CKMB, TROPONINI, MYOGLOBIN in the last 168 hours.  Invalid input(s): CK ------------------------------------------------------------------------------------------------------------------    Component Value Date/Time   BNP 34.5 02/20/2021 0310    Micro Results Recent Results (from the past 240 hour(s))  SARS CORONAVIRUS 2 (TAT 6-24 HRS) Nasopharyngeal Nasopharyngeal Swab     Status: None   Collection Time: 02/16/21  4:25 PM   Specimen: Nasopharyngeal Swab  Result Value Ref Range Status   SARS Coronavirus 2 NEGATIVE NEGATIVE Final    Comment: (NOTE) SARS-CoV-2 target nucleic acids are NOT DETECTED.  The SARS-CoV-2 RNA is generally detectable in upper and lower respiratory specimens during the acute phase of infection. Negative results do not preclude SARS-CoV-2 infection, do not rule out co-infections with other pathogens, and should not be used as the sole basis for treatment or other patient management  decisions. Negative results must be combined with clinical observations, patient history, and epidemiological information. The expected result is Negative.  Fact Sheet for Patients: HairSlick.nohttps://www.fda.gov/media/138098/download  Fact Sheet for Healthcare Providers: quierodirigir.comhttps://www.fda.gov/media/138095/download  This test is not yet approved or cleared by the [place] FDA and  has been authorized for detection and/or diagnosis of SARS-CoV-2 by FDA under an Emergency Use Authorization (EUA). This EUA will remain  in effect (meaning this test can be used) for the duration of the COVID-19 declaration under Se ction 564(b)(1) of the Act, 21 U.S.C. section 360bbb-3(b)(1), unless the authorization is terminated or revoked sooner.  Performed at [facility], [address] [place], [place] [address]   Respiratory (~20 pathogens) panel by PCR     Status: None   Collection Time: 02/16/21  5:06 PM   Specimen: Nasopharyngeal Swab; Respiratory  Result Value Ref Range Status   Adenovirus NOT DETECTED NOT DETECTED Final   Coronavirus 229E NOT DETECTED NOT DETECTED Final    Comment: (NOTE) The Coronavirus on the Respiratory Panel, DOES NOT test for the novel  Coronavirus (2019 nCoV)    Coronavirus HKU1 NOT DETECTED NOT DETECTED Final   Coronavirus NL63 NOT DETECTED NOT DETECTED Final   Coronavirus OC43 NOT DETECTED NOT DETECTED Final   Metapneumovirus NOT DETECTED NOT DETECTED Final   Rhinovirus / Enterovirus NOT DETECTED NOT DETECTED Final   Influenza A NOT DETECTED NOT DETECTED Final   Influenza B NOT DETECTED NOT DETECTED Final   Parainfluenza Virus 1 NOT DETECTED NOT DETECTED Final   Parainfluenza Virus 2 NOT DETECTED NOT DETECTED Final   Parainfluenza Virus 3 NOT DETECTED NOT DETECTED Final   Parainfluenza Virus 4 NOT DETECTED NOT DETECTED Final   Respiratory Syncytial Virus NOT DETECTED NOT DETECTED Final   Bordetella pertussis NOT DETECTED NOT DETECTED Final   Bordetella  Parapertussis NOT DETECTED NOT DETECTED Final   Chlamydophila pneumoniae NOT DETECTED NOT DETECTED Final   Mycoplasma pneumoniae NOT DETECTED NOT DETECTED Final    Comment: Performed at [facility], [address] [place], [place] [address]  Culture, group A strep     Status: None   Collection Time: 02/17/21 11:24 AM   Specimen: Throat  Result Value Ref Range Status   Specimen Description THROAT  Final   Special Requests Normal  Final   Culture   Final    NO GROUP A STREP (S.PYOGENES) ISOLATED Performed at [facility], [address], [place], [place] [address]    Report Status 02/19/2021 FINAL  Final    Radiology Reports CT ABDOMEN PELVIS WO CONTRAST  Result Date: 02/16/2021 CLINICAL DATA:  Abdominal pain. EXAM: CT ABDOMEN AND PELVIS WITHOUT CONTRAST TECHNIQUE: Multidetector CT imaging of the abdomen and pelvis was performed following the standard protocol without IV contrast. COMPARISON:  None. FINDINGS: Lower chest: No acute abnormality. Hepatobiliary: No focal liver abnormality is seen. No gallstones, gallbladder wall thickening, or biliary dilatation. Pancreas: Unremarkable. No pancreatic ductal dilatation or surrounding inflammatory changes. Spleen: Normal in size without focal abnormality. Adrenals/Urinary Tract: Adrenal glands appear normal. No hydronephrosis or renal obstruction is noted. No renal or ureteral calculi are noted. Urinary bladder is decompressed secondary to Foley catheter. Minimal inflammatory changes are noted around the right kidney suggesting possible pyelonephritis. Stomach/Bowel: Stomach is within normal limits. Appendix appears normal. No evidence of bowel wall thickening, distention, or inflammatory changes. Vascular/Lymphatic: No significant vascular findings are present. No enlarged abdominal or pelvic lymph nodes. Reproductive: No adnexal abnormality is noted. Probable small exophytic uterine fibroid is noted arising posteriorly from the uterus.  Other: No abdominal wall hernia or abnormality. No abdominopelvic ascites. Musculoskeletal: No acute or significant osseous findings. IMPRESSION: 1. Minimal inflammatory changes are noted around the right kidney suggesting possible pyelonephritis. 2. No hydronephrosis or renal obstruction is noted. No renal or ureteral calculi are noted. 3. Probable small exophytic uterine fibroid is noted arising posteriorly from the uterus. Electronically Signed   By: Lupita Raider M.D.   On: 02/16/2021 18:32   CT Angio Head W or Wo Contrast  Result Date: 01/27/2021 CLINICAL DATA:  Diplopia.  Dural sinus thrombosis suspected. EXAM: CT ANGIOGRAPHY HEAD CT VENOGRAM HEAD TECHNIQUE: Multidetector CT imaging of the head was performed using the standard protocol during bolus administration of intravenous contrast. Multiplanar CT image reconstructions and MIPs were obtained to evaluate the arterial anatomy. Additional delayed multidetector CT imaging of the head was performed with multiplanar CT image reconstructions and MIPS to evaluate the venous anatomy. CONTRAST:  OMNIPAQUE IOHEXOL 350 MG/ML SOLN COMPARISON:  None. FINDINGS: CT HEAD Brain: No evidence of acute large vascular territory infarction, acute hemorrhage, hydrocephalus, extra-axial collection or mass lesion/mass effect. Mild narrowing of the mamillopontine distance and effacement of the suprasellar cistern with trace tonsillar ectopia. Skull: No acute fracture. Sinuses: Visualized sinuses are clear. Orbits: No acute finding. CTA HEAD Due to venous timing, significantly limited evaluation. The intradural left vertebral artery is poorly opacified, which could relate to stenosis or non dominant vertebral artery. Otherwise, no evidence of proximal large vessel occlusion or large aneurysm in the anterior or posterior circulation. CT VENOGRAM No evidence of dural sinus thrombosis. Small left transverse sinus and narrowing of the distal right transverse sinus. Small  rounded filling defect in the distal right transverse sinus likely represents arachnoid granulation. IMPRESSION: CT head: 1. No definite evidence of acute intracranial abnormality. 2. Mild narrowing of the mamillopontine distance and effacement of the suprasellar cistern with trace tonsillar ectopia. Given the patient is young and has relative paucity of CSF spaces/sulci diffusely these findings may be within normal limits, but recommend correlation with signs/symptoms of intracranial hypotension including positional headaches. Additionally, per Dr. Rush Landmark the patient is going to get a follow-up MRI which can further evaluate these findings. CTA head: Due  to venous timing, significantly limited evaluation. The intradural left vertebral artery is not well opacified, which is poorly evaluated and could relate to nondominant vertebral artery or stenosis. A CTA neck could further evaluate if clinically indicated. Otherwise, no evidence of proximal large vessel occlusion or large aneurysm. A CTA of the neck could further evaluate the vertebral finding and a repeat CTA head could provide more sensitive intracranial arterial evaluation if clinically indicated. CTV: No evidence of dural sinus thrombosis. Findings discussed with Dr. Julieanne Manson at 3:05 PM via telephone. Electronically Signed   By: Feliberto Harts MD   On: 01/27/2021 15:15   DG Chest 2 View  Result Date: 02/16/2021 CLINICAL DATA:  Shortness of breath. EXAM: CHEST - 2 VIEW COMPARISON:  January 27, 2021. FINDINGS: The heart size and mediastinal contours are within normal limits. Both lungs are clear. No pneumothorax or pleural effusion is noted. The visualized skeletal structures are unremarkable. IMPRESSION: No active cardiopulmonary disease. Electronically Signed   By: Lupita Raider M.D.   On: 02/16/2021 12:53   US Renal  Result Date: 02/16/2021 CLINICAL DATA:  Renal failure EXAM: RENAL / URINARY TRACT ULTRASOUND COMPLETE COMPARISON:  CT 02/16/2021  FINDINGS: Right Kidney: Renal measurements: 11.9 x 5.8 x 4.4 cm = volume: 159.2 mL. Cortex is echogenic. No mass or hydronephrosis. Trace perinephric fluid Left Kidney: Renal measurements: 9.7 x 5.2 x 4.9 cm = volume: 129.5 mL. Cortex is echogenic. No mass or hydronephrosis. Bladder: Empty by Foley catheter. Other: None. IMPRESSION: Echogenic kidneys bilaterally consistent with medical renal disease. No hydronephrosis Electronically Signed   By: Jasmine Pang M.D.   On: 02/16/2021 20:23   IR Fluoro Guide CV Line Right  Result Date: 02/18/2021 INDICATION: 55 year old female referred for temporary hemodialysis catheter placement EXAM: IMAGE GUIDED PLACEMENT OF TEMPORARY HEMODIALYSIS CATHETER MEDICATIONS: None ANESTHESIA/SEDATION: Moderate (conscious) sedation was employed during this procedure. A total of Versed 1.0 mg and Fentanyl 50 mcg was administered intravenously. Moderate Sedation Time: 13 minutes. The patient's level of consciousness and vital signs were monitored continuously by radiology nursing throughout the procedure under my direct supervision. FLUOROSCOPY TIME:  Fluoroscopy Time: 0 minutes 6 seconds (1 mGy). COMPLICATIONS: None PROCEDURE: Informed written consent was obtained from the patient and the patient's family after a thorough discussion of the procedural risks, benefits and alternatives. All questions were addressed. A timeout was performed prior to the initiation of the procedure. The right neck and chest was prepped with chlorhexidine, and draped in the usual sterile fashion using maximum barrier technique (cap and mask, sterile gown, sterile gloves, large sterile sheet, hand hygiene and cutaneous antiseptic). Local anesthesia was attained by infiltration with 1% lidocaine without epinephrine. Ultrasound demonstrated patency of the right internal jugular vein, and this was documented with an image. Under real-time ultrasound guidance, this vein was accessed with a 21 gauge micropuncture  needle and image documentation was performed. A small dermatotomy was made at the access site with an 11 scalpel. A 0.018" wire was advanced into the SVC and the access needle exchanged for a 30F micropuncture vascular sheath. The 0.018" wire was then removed and a 0.035" wire advanced into the IVC. Upon withdrawal of the 018 wire, the wire was marked for appropriate length of the internal portion of the catheter. A 16 cm catheter was selected. Skin and subcutaneous tissues were serially dilated. Catheter was placed on the wire. The catheter tip is positioned in the upper right atrium. This was documented with a spot image. Both ports of  the hemodialysis catheter were then tested for excellent function. The ports were then locked with heparinized lock. Patient tolerated the procedure well and remained hemodynamically stable throughout. No complications were encountered and no significant blood loss was encountered. IMPRESSION: Status post image guided placement of temporary hemodialysis catheter. Catheter may be converted if needed. Signed, Yvone Neu. Reyne Dumas, RPVI [facility] Electronically Signed   By: Gilmer Mor D.O.   On: 02/18/2021 14:21   IR US Guide Vasc Access Right  Result Date: 02/18/2021 INDICATION: 55 year old female referred for temporary hemodialysis catheter placement EXAM: IMAGE GUIDED PLACEMENT OF TEMPORARY HEMODIALYSIS CATHETER MEDICATIONS: None ANESTHESIA/SEDATION: Moderate (conscious) sedation was employed during this procedure. A total of Versed 1.0 mg and Fentanyl 50 mcg was administered intravenously. Moderate Sedation Time: 13 minutes. The patient's level of consciousness and vital signs were monitored continuously by radiology nursing throughout the procedure under my direct supervision. FLUOROSCOPY TIME:  Fluoroscopy Time: 0 minutes 6 seconds (1 mGy). COMPLICATIONS: None PROCEDURE: Informed written consent was obtained from  the patient and the patient's family after a thorough discussion of the procedural risks, benefits and alternatives. All questions were addressed. A timeout was performed prior to the initiation of the procedure. The right neck and chest was prepped with chlorhexidine, and draped in the usual sterile fashion using maximum barrier technique (cap and mask, sterile gown, sterile gloves, large sterile sheet, hand hygiene and cutaneous antiseptic). Local anesthesia was attained by infiltration with 1% lidocaine without epinephrine. Ultrasound demonstrated patency of the right internal jugular vein, and this was documented with an image. Under real-time ultrasound guidance, this vein was accessed with a 21 gauge micropuncture needle and image documentation was performed. A small dermatotomy was made at the access site with an 11 scalpel. A 0.018" wire was advanced into the SVC and the access needle exchanged for a 59F micropuncture vascular sheath. The 0.018" wire was then removed and a 0.035" wire advanced into the IVC. Upon withdrawal of the 018 wire, the wire was marked for appropriate length of the internal portion of the catheter. A 16 cm catheter was selected. Skin and subcutaneous tissues were serially dilated. Catheter was placed on the wire. The catheter tip is positioned in the upper right atrium. This was documented with a spot image. Both ports of the hemodialysis catheter were then tested for excellent function. The ports were then locked with heparinized lock. Patient tolerated the procedure well and remained hemodynamically stable throughout. No complications were encountered and no significant blood loss was encountered. IMPRESSION: Status post image guided placement of temporary hemodialysis catheter. Catheter may be converted if needed. Signed, Yvone Neu. Reyne Dumas, RPVI [facility] Radiology Electronically Signed   By: Gilmer Mor D.O.   On: 02/18/2021  14:21   DG Chest Portable 1 View  Result Date: 01/27/2021 CLINICAL DATA:  Persistent cough. Recurrent upper respiratory infection EXAM: PORTABLE CHEST 1 VIEW COMPARISON:  Radiograph 06/27/2016 FINDINGS: 1355 hours. The heart size and mediastinal contours are normal. The lungs are clear. There is no pleural effusion or pneumothorax. No acute osseous findings are identified. Telemetry leads overlie the chest. IMPRESSION: No active cardiopulmonary process. Electronically Signed   By: Carey Bullocks M.D.   On: 01/27/2021 14:47   ECHOCARDIOGRAM COMPLETE  Result Date: 02/17/2021    ECHOCARDIOGRAM REPORT   Patient Name:   Denise Watson Date of Exam: 02/17/2021 Medical Rec #:  [id number]       Height:  65.0 in Accession #:    [id number]      Weight:       240.0 lb Date of Birth:  Dec 30, 1965        BSA:          2.138 m Patient Age:    54 years        BP:           120/60 mmHg Patient Gender: F               HR:           67 bpm. Exam Location:  Inpatient Procedure: 2D Echo Indications:    CHF-Acute Diastolic I50.31  History:        Patient has no prior history of Echocardiogram examinations.  Sonographer:    Thurman Coyer RDCS (AE) Referring Phys: [id number] [facility] IMPRESSIONS  1. Left ventricular ejection fraction, by estimation, is 60%. The left ventricle has normal function. The left ventricle has no regional wall motion abnormalities. There is mild left ventricular hypertrophy. Left ventricular diastolic parameters were low normal.  2. Right ventricular systolic function is normal. The right ventricular size is normal. Tricuspid regurgitation signal is inadequate for assessing PA pressure.  3. The mitral valve is normal in structure. Trivial mitral valve regurgitation. No evidence of mitral stenosis.  4. The aortic valve is tricuspid. Aortic valve regurgitation is not visualized. No aortic stenosis is present.  5. The inferior vena cava is normal in size with greater than 50% respiratory  variability, suggesting right atrial pressure of 3 mmHg. FINDINGS  Left Ventricle: Left ventricular ejection fraction, by estimation, is 60%. The left ventricle has normal function. The left ventricle has no regional wall motion abnormalities. The left ventricular internal cavity size was normal in size. There is mild left ventricular hypertrophy. Left ventricular diastolic parameters were normal. Right Ventricle: The right ventricular size is normal. No increase in right ventricular wall thickness. Right ventricular systolic function is normal. Tricuspid regurgitation signal is inadequate for assessing PA pressure. Left Atrium: Left atrial size was normal in size. Right Atrium: Right atrial size was normal in size. Pericardium: There is no evidence of pericardial effusion. Mitral Valve: The mitral valve is normal in structure. Trivial mitral valve regurgitation. No evidence of mitral valve stenosis. Tricuspid Valve: The tricuspid valve is normal in structure. Tricuspid valve regurgitation is trivial. No evidence of tricuspid stenosis. Aortic Valve: The aortic valve is tricuspid. Aortic valve regurgitation is not visualized. No aortic stenosis is present. Pulmonic Valve: The pulmonic valve was normal in structure. Pulmonic valve regurgitation is trivial. No evidence of pulmonic stenosis. Aorta: The aortic root is normal in size and structure. Venous: The inferior vena cava is normal in size with greater than 50% respiratory variability, suggesting right atrial pressure of 3 mmHg. IAS/Shunts: The interatrial septum was not well visualized.  LEFT VENTRICLE PLAX 2D LVIDd:         4.10 cm  Diastology LVIDs:         2.80 cm  LV e' medial:    6.53 cm/s LV PW:         1.20 cm  LV E/e' medial:  11.0 LV IVS:        1.20 cm  LV e' lateral:   9.57 cm/s LVOT diam:     2.10 cm  LV E/e' lateral: 7.5 LV SV:         76 LV SV Index:   36 LVOT Area:  3.46 cm  RIGHT VENTRICLE RV S prime:     15.40 cm/s TAPSE (M-mode): 1.4 cm LEFT  ATRIUM             Index       RIGHT ATRIUM          Index LA diam:        3.10 cm 1.45 cm/m  RA Area:     9.08 cm LA Vol (A2C):   27.6 ml 12.91 ml/m RA Volume:   15.80 ml 7.39 ml/m LA Vol (A4C):   39.6 ml 18.53 ml/m LA Biplane Vol: 35.3 ml 16.51 ml/m  AORTIC VALVE LVOT Vmax:   107.00 cm/s LVOT Vmean:  64.800 cm/s LVOT VTI:    0.220 m  AORTA Ao Root diam: 2.80 cm MITRAL VALVE MV Area (PHT): 2.42 cm    SHUNTS MV Decel Time: 313 msec    Systemic VTI:  0.22 m MV E velocity: 71.60 cm/s  Systemic Diam: 2.10 cm MV A velocity: 73.30 cm/s MV E/A ratio:  0.98 Weston Brass MD Electronically signed by Weston Brass MD Signature Date/Time: 02/17/2021/1:13:33 PM    Final    US BIOPSY (KIDNEY)  Result Date: 02/20/2021 INDICATION: 57 year old with acute kidney injury and request for renal biopsy. EXAM: ULTRASOUND-GUIDED RANDOM RENAL BIOPSY MEDICATIONS: Moderate sedation none. ANESTHESIA/SEDATION: Moderate (conscious) sedation was employed during this procedure. A total of Versed 1.0 mg and Fentanyl 50 mcg was administered intravenously. Moderate Sedation Time: 15 minutes. The patient's level of consciousness and vital signs were monitored continuously by radiology nursing throughout the procedure under my direct supervision. FLUOROSCOPY TIME:  None COMPLICATIONS: None immediate. PROCEDURE: Informed written consent was obtained from the patient after a thorough discussion of the procedural risks, benefits and alternatives. All questions were addressed. A timeout was performed prior to the initiation of the procedure. Patient was placed prone. Both kidneys were evaluated with ultrasound. The right kidney was targeted for biopsy. The right flank was prepped with chlorhexidine and sterile field was created. Maximal barrier sterile technique was utilized including caps, mask, sterile gowns, sterile gloves, sterile drape, hand hygiene and skin antiseptic. Skin was anesthetized using 1% lidocaine. A small incision was  made. Using ultrasound guidance, 16 gauge core biopsy was directed into the right kidney lower pole. Adequate specimen was obtained and placed in saline. A second ultrasound-guided core biopsy was obtained from the right kidney lower pole. Adequate specimen was obtained and placed in saline. Bandage placed over the puncture site. FINDINGS: Negative for hydronephrosis. Two core biopsies obtained from the right kidney lower pole. No significant bleeding or hematoma formation. IMPRESSION: Successful ultrasound-guided random core biopsies from the right kidney lower pole. Electronically Signed   By: Richarda Overlie M.D.   On: 02/20/2021 17:20   CT VENOGRAM HEAD  Result Date: 01/27/2021 CLINICAL DATA:  Diplopia.  Dural sinus thrombosis suspected. EXAM: CT ANGIOGRAPHY HEAD CT VENOGRAM HEAD TECHNIQUE: Multidetector CT imaging of the head was performed using the standard protocol during bolus administration of intravenous contrast. Multiplanar CT image reconstructions and MIPs were obtained to evaluate the arterial anatomy. Additional delayed multidetector CT imaging of the head was performed with multiplanar CT image reconstructions and MIPS to evaluate the venous anatomy. CONTRAST:  OMNIPAQUE IOHEXOL 350 MG/ML SOLN COMPARISON:  None. FINDINGS: CT HEAD Brain: No evidence of acute large vascular territory infarction, acute hemorrhage, hydrocephalus, extra-axial collection or mass lesion/mass effect. Mild narrowing of the mamillopontine distance and effacement of the suprasellar cistern with trace tonsillar ectopia.  Skull: No acute fracture. Sinuses: Visualized sinuses are clear. Orbits: No acute finding. CTA HEAD Due to venous timing, significantly limited evaluation. The intradural left vertebral artery is poorly opacified, which could relate to stenosis or non dominant vertebral artery. Otherwise, no evidence of proximal large vessel occlusion or large aneurysm in the anterior or posterior circulation. CT VENOGRAM No  evidence of dural sinus thrombosis. Small left transverse sinus and narrowing of the distal right transverse sinus. Small rounded filling defect in the distal right transverse sinus likely represents arachnoid granulation. IMPRESSION: CT head: 1. No definite evidence of acute intracranial abnormality. 2. Mild narrowing of the mamillopontine distance and effacement of the suprasellar cistern with trace tonsillar ectopia. Given the patient is young and has relative paucity of CSF spaces/sulci diffusely these findings may be within normal limits, but recommend correlation with signs/symptoms of intracranial hypotension including positional headaches. Additionally, per Dr. Rush Landmark the patient is going to get a follow-up MRI which can further evaluate these findings. CTA head: Due to venous timing, significantly limited evaluation. The intradural left vertebral artery is not well opacified, which is poorly evaluated and could relate to nondominant vertebral artery or stenosis. A CTA neck could further evaluate if clinically indicated. Otherwise, no evidence of proximal large vessel occlusion or large aneurysm. A CTA of the neck could further evaluate the vertebral finding and a repeat CTA head could provide more sensitive intracranial arterial evaluation if clinically indicated. CTV: No evidence of dural sinus thrombosis. Findings discussed with Dr. Julieanne Manson at 3:05 PM via telephone. Electronically Signed   By: Feliberto Harts MD   On: 01/27/2021 15:15   US Abdomen Limited RUQ (LIVER/GB)  Result Date: 02/20/2021 CLINICAL DATA:  Transaminitis EXAM: ULTRASOUND ABDOMEN LIMITED RIGHT UPPER QUADRANT COMPARISON:  CT abdomen and pelvis 02/16/2021 FINDINGS: Gallbladder: Normally distended. 6 mm nonshadowing intraluminal echogenic focus likely small polyp; no follow-up imaging recommended. No gallbladder wall thickening, pericholecystic fluid or sonographic Murphy sign. No discrete shadowing calculi. Common bile duct:  Diameter: 2 mm Liver: Slightly heterogeneous echogenicity, nonspecific. No discrete hepatic mass or nodularity. No intrahepatic biliary dilatation. Portal vein is patent on color Doppler imaging with normal direction of blood flow towards the liver. Other: No RIGHT upper quadrant free fluid. IMPRESSION: 6 mm gallbladder polyp; no follow-up imaging recommended. Nonspecific mildly heterogeneous hepatic parenchymal echogenicity without mass or biliary dilatation. Electronically Signed   By: Ulyses Southward M.D.   On: 02/20/2021 18:13

## 2021-02-21 NOTE — Progress Notes (Signed)
Hemodialysis- Patient continues to complain of RUQ pain radiating to back, states is sharp, very difficult to get comfortable in bed. 8/10. Notified MD.

## 2021-02-22 DIAGNOSIS — R7401 Elevation of levels of liver transaminase levels: Secondary | ICD-10-CM | POA: Diagnosis not present

## 2021-02-22 DIAGNOSIS — N179 Acute kidney failure, unspecified: Secondary | ICD-10-CM | POA: Diagnosis not present

## 2021-02-22 LAB — HEPATIC FUNCTION PANEL
ALT: 116 U/L — ABNORMAL HIGH (ref 0–44)
AST: 158 U/L — ABNORMAL HIGH (ref 15–41)
Albumin: 2 g/dL — ABNORMAL LOW (ref 3.5–5.0)
Alkaline Phosphatase: 81 U/L (ref 38–126)
Bilirubin, Direct: 0.1 mg/dL (ref 0.0–0.2)
Indirect Bilirubin: 0.6 mg/dL (ref 0.3–0.9)
Total Bilirubin: 0.7 mg/dL (ref 0.3–1.2)
Total Protein: 5.9 g/dL — ABNORMAL LOW (ref 6.5–8.1)

## 2021-02-22 MED ORDER — DOCUSATE SODIUM 100 MG PO CAPS
100.0000 mg | ORAL_CAPSULE | Freq: Two times a day (BID) | ORAL | Status: DC | PRN
  Administered 2021-02-23 (×2): 100 mg via ORAL
  Filled 2021-02-22 (×2): qty 1

## 2021-02-22 MED ORDER — POLYETHYLENE GLYCOL 3350 17 G PO PACK
17.0000 g | PACK | Freq: Every day | ORAL | Status: DC
  Administered 2021-02-22 – 2021-02-27 (×4): 17 g via ORAL
  Filled 2021-02-22 (×5): qty 1

## 2021-02-22 MED ORDER — BISACODYL 5 MG PO TBEC: 5.0000 mg | DELAYED_RELEASE_TABLET | Freq: Every day | ORAL | Status: DC | PRN

## 2021-02-22 MED ORDER — FLEET ENEMA 7-19 GM/118ML RE ENEM
1.0000 | ENEMA | Freq: Every day | RECTAL | Status: DC | PRN
  Administered 2021-02-23: 1 via RECTAL
  Filled 2021-02-22: qty 1

## 2021-02-22 NOTE — Progress Notes (Addendum)
Received preliminary kidney biopsy results Dr. Darrel Reach [facility]): Overall odd picture. 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 She has one glomeruli with crescent formation, mesangial deposition with full house staining. 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 I wonder if she is APOL1 positive. COVID is also known to do this and this may have been a trigger for collapsing FSGS (however her timeline does not fit and she has normal kidney function at the end of Feb, her COVID was back in Jan). She does have evidence of acute tubular injury as expected and it is thought that her tubular injury is in fact driven by her collapsing glomerulopathy. We really do need to know how much proteinuria she has therefore I will order a 24 hour urine protein collection. Will start her on cellcept likely tomorrow, will discuss these biopsy results with the patient in AM.  Anthony Sar, MD [facility]

## 2021-02-22 NOTE — Progress Notes (Signed)
[name]  Assessment/ Plan: Pt is a 55 y.o. yo female with history of psoriasis, COVID-19 infection, recent diagnosis of HTN restarted HCTZ presented with generalized body pain, shortness of breath and found to have AKI.  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 The urine study with proteinuria around 6g, serum albumin 1.5 and trace edema. No microscopic hematuria or WBC in the first urinalysis.   The repeat urinalysis was after placement of Foley catheter.  Kidney ultrasound without hydronephrosis. The lab result came back with elevated antidsDNA >300 with low C3 and C4 and high ANA titers. Hep B, C, HIV, ANCA, ASO negative. -s/p biopsy 3/21, awaiting results (none yet) -We started solumedrol 250 mg IV on 3/19 for 3 days and now on prednisone 60 mg as of 3/22, plan to taper in 2 weeks, plan to taper to 40mg  on 4/5. Further treatment including CellCept, voclosporin, cytoxan depending on the biopsy result. Continue pepcid 10 mg daily. -Started dialysis on 3/19 for oliguric AKI and azotemia. RIJ temp HD catheter placed by IR. -HD tomorrow  Hypertension: Blood pressure acceptable.  UF as tolerated  Exertional shortness of breath: Chest x-ray negative.    Echo with EF of 60%.  UF with HD.  Sore throat: Reportedly she was treated with penicillin by her PCP.    ASO titer not elevated.  Transaminitis: work up per primary  Subjective: Seen and examined at bedside.  Having some back soreness after biopsy yesterday which she tolerated well. Due for HD today  Objective Vital signs in last 24 hours: Vitals:   02/21/21 1530 02/21/21 1551 02/21/21 1601 02/21/21 2032  BP: 95/61 101/62 109/64 117/60  Pulse: (!) 56     Resp:    16  Temp:    98.3 F (36.8 C)  TempSrc:    Axillary  SpO2:  95%  96%  Weight:  134.5 kg     Height:       Weight change:   Intake/Output Summary (Last 24 hours) at 02/22/2021 1433 Last data filed at 02/21/2021 2314 Gross per 24 hour  Intake -  Output 2520 ml  Net -2520 ml       Labs: Basic Metabolic Panel: Recent Labs  Lab 02/18/21 1420 02/19/21 0206 02/20/21 0310 02/21/21 1137  NA 132* 131* 130* 131*  K 4.8 4.5 4.5 4.8  CL 98 97* 97* 97*  CO2 18* 20* 22 20*  GLUCOSE 103* 139* 152* 144*  BUN 119* 81* 71* 98*  CREATININE 12.28* 9.57* 9.47* 11.78*  CALCIUM 7.2* 7.1* 7.7* 7.9*  PHOS 9.0*  --   --  8.6*   Liver Function Tests: Recent Labs  Lab 02/19/21 0206 02/20/21 0310 02/21/21 1137 02/22/21 0601  AST 110* 163*  --  158*  ALT 55* 81*  --  116*  ALKPHOS 74 78  --  81  BILITOT 0.5 0.6  --  0.7  PROT 4.8* 5.3*  --  5.9*  ALBUMIN 1.3* 1.6* 1.8* 2.0*   No results for input(s): LIPASE, AMYLASE in the last 168 hours. No results for input(s): AMMONIA in the last 168 hours. CBC: Recent Labs  Lab 02/18/21 0230 02/18/21 1420 02/19/21 0206 02/20/21 0310 02/21/21 1137  WBC 3.1* 3.3* 5.3 7.0 9.7  NEUTROABS 1.6*  --  4.1 5.2  --   HGB 10.8* 12.1 11.4* 10.8* 11.0*  HCT  32.2* 35.7* 32.9* 32.1* 33.8*  MCV 88.5 88.4 87.5 88.7 89.9  PLT 190 254 100* 93* 143*   Cardiac Enzymes: Recent Labs  Lab 02/17/21 0802  CKTOTAL 333*   CBG: No results for input(s): GLUCAP in the last 168 hours.  Iron Studies: No results for input(s): IRON, TIBC, TRANSFERRIN, FERRITIN in the last 72 hours. Studies/Results: US RENAL  Result Date: 02/21/2021 CLINICAL DATA:  Acute renal insufficiency EXAM: RENAL / URINARY TRACT ULTRASOUND COMPLETE COMPARISON:  02/20/2021 right upper quadrant ultrasound. Renal ultrasound 02/16/2021. FINDINGS: Right Kidney: Renal measurements: 12.3 by 6.0 x 5.5 cm = volume: 213 mL. Increased renal echogenicity. No hydronephrosis. Left Kidney: Renal measurements: 10.0 x 5.3 x 4.4 cm = volume: 128 mL. Increased renal echogenicity. No hydronephrosis.  Bladder: Collapsed around a Foley catheter. Other: None. IMPRESSION: No hydronephrosis. Increased renal echogenicity, suggesting medical renal disease. Electronically Signed   By: Jeronimo Greaves M.D.   On: 02/21/2021 21:36   US BIOPSY (KIDNEY)  Result Date: 02/20/2021 INDICATION: 35 year old with acute kidney injury and request for renal biopsy. EXAM: ULTRASOUND-GUIDED RANDOM RENAL BIOPSY MEDICATIONS: Moderate sedation none. ANESTHESIA/SEDATION: Moderate (conscious) sedation was employed during this procedure. A total of Versed 1.0 mg and Fentanyl 50 mcg was administered intravenously. Moderate Sedation Time: 15 minutes. The patient's level of consciousness and vital signs were monitored continuously by radiology nursing throughout the procedure under my direct supervision. FLUOROSCOPY TIME:  None COMPLICATIONS: None immediate. PROCEDURE: Informed written consent was obtained from the patient after a thorough discussion of the procedural risks, benefits and alternatives. All questions were addressed. A timeout was performed prior to the initiation of the procedure. Patient was placed prone. Both kidneys were evaluated with ultrasound. The right kidney was targeted for biopsy. The right flank was prepped with chlorhexidine and sterile field was created. Maximal barrier sterile technique was utilized including caps, mask, sterile gowns, sterile gloves, sterile drape, hand hygiene and skin antiseptic. Skin was anesthetized using 1% lidocaine. A small incision was made. Using ultrasound guidance, 16 gauge core biopsy was directed into the right kidney lower pole. Adequate specimen was obtained and placed in saline. A second ultrasound-guided core biopsy was obtained from the right kidney lower pole. Adequate specimen was obtained and placed in saline. Bandage placed over the puncture site. FINDINGS: Negative for hydronephrosis. Two core biopsies obtained from the right kidney lower pole. No significant bleeding or  hematoma formation. IMPRESSION: Successful ultrasound-guided random core biopsies from the right kidney lower pole. Electronically Signed   By: Richarda Overlie M.D.   On: 02/20/2021 17:20   US Abdomen Limited RUQ (LIVER/GB)  Result Date: 02/20/2021 CLINICAL DATA:  Transaminitis EXAM: ULTRASOUND ABDOMEN LIMITED RIGHT UPPER QUADRANT COMPARISON:  CT abdomen and pelvis 02/16/2021 FINDINGS: Gallbladder: Normally distended. 6 mm nonshadowing intraluminal echogenic focus likely small polyp; no follow-up imaging recommended. No gallbladder wall thickening, pericholecystic fluid or sonographic Murphy sign. No discrete shadowing calculi. Common bile duct: Diameter: 2 mm Liver: Slightly heterogeneous echogenicity, nonspecific. No discrete hepatic mass or nodularity. No intrahepatic biliary dilatation. Portal vein is patent on color Doppler imaging with normal direction of blood flow towards the liver. Other: No RIGHT upper quadrant free fluid. IMPRESSION: 6 mm gallbladder polyp; no follow-up imaging recommended. Nonspecific mildly heterogeneous hepatic parenchymal echogenicity without mass or biliary dilatation. Electronically Signed   By: Ulyses Southward M.D.   On: 02/20/2021 18:13    Medications: Infusions:   Scheduled Medications: . Chlorhexidine Gluconate Cloth  6 each Topical Q0600  . famotidine  10 mg Oral Daily  . polyethylene glycol  17 g Oral Daily  . predniSONE  60 mg Oral Q breakfast   I have reviewed scheduled and prn medications.  Physical Exam: General:NAD, comfortable Heart:RRR, s1s2 nl Lungs: decreased breath sounds bibasilar, no increase work of breathing Abdomen:soft, Non-tender, non-distended Extremities: Trace LE edema b/l Dialysis Access: Right IJ temporary HD catheter site looks clean  Denise Watson 02/22/2021,2:33 PM  LOS: 6 days

## 2021-02-22 NOTE — Progress Notes (Signed)
PT Cancellation Note  Patient Details Name: Denise Watson MRN: [id number] DOB: 05-09-1966   Cancelled Treatment:    Reason Eval/Treat Not Completed: (P) Patient declined, no reason specified Pt in recliner dozing, reports she is finally comfortable, but is still experiencing increased abdominal pain. Pt request PT return tomorrow for therapy.  Chad Donoghue B. Beverely Risen PT, DPT Acute Rehabilitation Services Pager [phone] Office [phone]   Elon Alas [facility] 02/22/2021, 2:46 PM

## 2021-02-22 NOTE — Progress Notes (Addendum)
PROGRESS NOTE                                                                                                                                                                                                             Patient Demographics:    Denise Watson, is a 55 y.o. female, DOB - [birth year], ZOX:[id number]  Outpatient Primary MD for the patient is Piedad Climes, [place]    LOS - 6  Admit date - 02/16/2021    Chief Complaint  Patient presents with  . Shortness of Breath       Brief Narrative (HPI from H&P)  -  Denise Watson  is a 55 y.o. female, who is in relatively good health except recent diagnosis of essential hypertension requiring HCTZ for the last few weeks, obesity, COVID-19 infection in January, psoriasis, C-section in the past, intermittent palpitations in the past.  Apparently patient has been having symptoms of sore throat for the last 2 weeks and subsequently has been developing gradual fatigue and some exertional shortness of breath, denies any fever chills, no chest or abdominal pain, no blood in stool or urine, no cough or sinus pressure, no swelling in arms or legs, she does have petechial rash but she says this is due to her psoriasis and is chronic.  She presented to the ER where she was found to have AKI with a creatinine of 10, nephrology was consulted and I was requested to admit the patient.  Currently review of systems positive for sore throat, fatigue, exertional shortness of breath, chronic petechial rash due to psoriasis according to the patient.  Other review of systems are negative   Subjective:   Patient sitting in a chair this morning, in no apparent distress, she denies dyspnea, denies any chest pain,.   Assessment  & Plan :   Generalized fatigue, exertional shortness of breath and pharyngitis.   - In a patient with AKI, she was recently diagnosed with hypertension and started with  HCTZ as well.  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 -On IV Solu-Medrol >> prednisone  Petechial rash.  - Patient claims this is chronic due to psoriasis but could be due to platelet dysfunction caused by AKI.  Will monitor.  Platelet counts are stable pressure stable.  Borderline hyperkalemia.  - Stable after kayaxalate and HD.  Exertional shortness of breath -  likely due to AKI, stable TTE. HD now for any extra fluid removal.    Obesity.  BMI 39.  Follow with PCP.  Hypertension.  Gentle IV hydralazine, will avoid hypotension due to AKI.  Pharyngitis.  Strep throat culture negative, strep urinary antigen negative, ASO titer negative, likely viral pharyngitis  History of psoriasis.  Question if she has discoid lupus.  Outpatient dermatology follow-up.  Asymptomatic mild rise in LFTs.  Would be due to hepatic congestion along with acute hepatitis panel negative, she is symptom-free.Stable RUQ [place] with incidental gallbladder polyp, outpatient follow-up with PCP. Trend LFTs.       Condition -   Guarded  Family Communication  : none at bedside  Code Status :  Full  Consults  :  Renal  PUD Prophylaxis :    Procedures  :     RUQ US - GB Polyp  R. Kidney biopsy 02/20/21  R.IJ HD Cath by IR 02/18/21  Renal US - medical renal disease.  TTE - 1. Left ventricular ejection fraction, by estimation, is 60%. The left ventricle has normal function. The left ventricle has no regional wall motion abnormalities. There is mild left ventricular hypertrophy. Left ventricular diastolic parameters were low normal.  2. Right ventricular systolic function is  normal. The right ventricular size is normal. Tricuspid regurgitation signal is inadequate for assessing PA pressure.  3. The mitral valve is normal in structure. Trivial mitral valve regurgitation. No evidence of mitral stenosis.  4. The aortic valve is tricuspid. Aortic valve regurgitation is not visualized. No aortic stenosis is present.  5. The inferior vena cava is normal in size with greater than 50% respiratory variability, suggesting right atrial pressure of 3 mmHg.       Disposition Plan  :    Status is: Inpatient  Remains inpatient appropriate because:IV treatments appropriate due to intensity of illness or inability to take PO   Dispo: The patient is from: Home              Anticipated d/c is to: Home              Patient currently is not medically stable to d/c.   Difficult to place patient No  DVT Prophylaxis  :  Heparin   Lab Results  Component Value Date   PLT 143 (L) 02/21/2021    Diet :  Diet Order            Diet renal/carb modified with fluid restriction Diet-HS Snack? Nothing; Fluid restriction: Other (see comments); Room service appropriate? Yes; Fluid consistency: Thin  Diet effective now                  Inpatient Medications  Scheduled Meds: . Chlorhexidine Gluconate Cloth  6 each Topical Q0600  . famotidine  10 mg Oral Daily  . polyethylene glycol  17 g Oral Daily  . predniSONE  60 mg Oral Q breakfast   Continuous Infusions:  PRN Meds:.acetaminophen **OR** [DISCONTINUED] acetaminophen, alum & mag hydroxide-simeth, bisacodyl, docusate sodium, hydrALAZINE, [DISCONTINUED] ondansetron **OR** ondansetron (ZOFRAN) IV, phenol, sodium chloride flush, sodium phosphate, traMADol  Antibiotics  :    Anti-infectives (From admission, onward)   None  Huey Bienenstockawood Mory Herrman M.D on 02/22/2021 at 12:34 PM  To page go to [email or web address]   [facility] -  Office  [phone]   See all Orders from today for further details    Objective:   Vitals:    02/21/21 1530 02/21/21 1551 02/21/21 1601 02/21/21 2032  BP: 95/61 101/62 109/64 117/60  Pulse: (!) 56     Resp:    16  Temp:    98.3 F (36.8 C)  TempSrc:    Axillary  SpO2:  95%  96%  Weight:  134.5 kg    Height:        Wt Readings from Last 3 Encounters:  02/21/21 134.5 kg  01/27/21 108.9 kg  06/27/16 132 kg     Intake/Output Summary (Last 24 hours) at 02/22/2021 1234 Last data filed at 02/21/2021 2314 Gross per 24 hour  Intake --  Output 2520 ml  Net -2520 ml     Physical Exam  Awake Alert, Oriented X 3, No new F.N deficits, Normal affect Symmetrical Chest wall movement, Good air movement bilaterally, CTAB RRR,No Gallops,Rubs or new Murmurs, No Parasternal Heave +ve B.Sounds, Abd Soft, No tenderness, No rebound - guarding or rigidity. No Cyanosis, petechial rash and discoid rash, - foley, R IJ HD Cath   Data Review:    CBC Recent Labs  Lab 02/17/21 0047 02/18/21 0230 02/18/21 1420 02/19/21 0206 02/20/21 0310 02/21/21 1137  WBC 3.2* 3.1* 3.3* 5.3 7.0 9.7  HGB 11.8* 10.8* 12.1 11.4* 10.8* 11.0*  HCT 36.5 32.2* 35.7* 32.9* 32.1* 33.8*  PLT 214 190 254 100* 93* 143*  MCV 89.7 88.5 88.4 87.5 88.7 89.9  MCH 29.0 29.7 30.0 30.3 29.8 29.3  MCHC 32.3 33.5 33.9 34.7 33.6 32.5  RDW 13.4 13.6 13.8 13.6 13.6 13.8  LYMPHSABS 0.4* 1.1  --  1.0 1.1  --   MONOABS 0.2 0.3  --  0.2 0.6  --   EOSABS 0.0 0.0  --  0.0 0.0  --   BASOSABS 0.0 0.0  --  0.0 0.0  --     Recent Labs  Lab 02/16/21 1940 02/17/21 0047 02/18/21 0230 02/18/21 1420 02/19/21 0206 02/20/21 0310 02/21/21 1137 02/22/21 0601  NA  --  131* 131* 132* 131* 130* 131*  --   K  --  4.1 4.1 4.8 4.5 4.5 4.8  --   CL  --  97* 99 98 97* 97* 97*  --   CO2  --  21* 18* 18* 20* 22 20*  --   GLUCOSE  --  105* 99 103* 139* 152* 144*  --   BUN  --  105* 114* 119* 81* 71* 98*  --   CREATININE  --  10.53* 11.93* 12.28* 9.57* 9.47* 11.78*  --   CALCIUM  --  7.4* 7.1* 7.2* 7.1* 7.7* 7.9*  --   AST 123*  127* 132*  --  110* 163*  --  158*  ALT 62* 63* 65*  --  55* 81*  --  116*  ALKPHOS 69 68 69  --  74 78  --  81  BILITOT 1.3* 1.3* 1.1  --  0.5 0.6  --  0.7  ALBUMIN 1.5* 1.5* 1.4* 1.5* 1.3* 1.6* 1.8* 2.0*  MG  --  2.3 2.3  --  2.0 2.2  --   --   INR  --  1.0  --   --   --   --   --   --   TSH  1.836  --   --   --   --   --   --   --   HGBA1C 6.3*  --   --   --   --   --   --   --   BNP  --  17.6 13.6  --  23.5 34.5  --   --     ------------------------------------------------------------------------------------------------------------------ No results for input(s): CHOL, HDL, LDLCALC, TRIG, CHOLHDL, LDLDIRECT in the last 72 hours.  Lab Results  Component Value Date   HGBA1C 6.3 (H) 02/16/2021   ------------------------------------------------------------------------------------------------------------------ No results for input(s): TSH, T4TOTAL, T3FREE, THYROIDAB in the last 72 hours.  Invalid input(s): FREET3  Cardiac Enzymes No results for input(s): CKMB, TROPONINI, MYOGLOBIN in the last 168 hours.  Invalid input(s): CK ------------------------------------------------------------------------------------------------------------------    Component Value Date/Time   BNP 34.5 02/20/2021 0310    Micro Results Recent Results (from the past 240 hour(s))  SARS CORONAVIRUS 2 (TAT 6-24 HRS) Nasopharyngeal Nasopharyngeal Swab     Status: None   Collection Time: 02/16/21  4:25 PM   Specimen: Nasopharyngeal Swab  Result Value Ref Range Status   SARS Coronavirus 2 NEGATIVE NEGATIVE Final    Comment: (NOTE) SARS-CoV-2 target nucleic acids are NOT DETECTED.  The SARS-CoV-2 RNA is generally detectable in upper and lower respiratory specimens during the acute phase of infection. Negative results do not preclude SARS-CoV-2 infection, do not rule out co-infections with other pathogens, and should not be used as the sole basis for treatment or other patient management  decisions. Negative results must be combined with clinical observations, patient history, and epidemiological information. The expected result is Negative.  Fact Sheet for Patients: HairSlick.no  Fact Sheet for Healthcare Providers: quierodirigir.com  This test is not yet approved or cleared by the [place] FDA and  has been authorized for detection and/or diagnosis of SARS-CoV-2 by FDA under an Emergency Use Authorization (EUA). This EUA will remain  in effect (meaning this test can be used) for the duration of the COVID-19 declaration under Se ction 564(b)(1) of the Act, 21 U.S.C. section 360bbb-3(b)(1), unless the authorization is terminated or revoked sooner.  Performed at [facility], [address], [place], [place] [address]   Respiratory (~20 pathogens) panel by PCR     Status: None   Collection Time: 02/16/21  5:06 PM   Specimen: Nasopharyngeal Swab; Respiratory  Result Value Ref Range Status   Adenovirus NOT DETECTED NOT DETECTED Final   Coronavirus 229E NOT DETECTED NOT DETECTED Final    Comment: (NOTE) The Coronavirus on the Respiratory Panel, DOES NOT test for the novel  Coronavirus (2019 nCoV)    Coronavirus HKU1 NOT DETECTED NOT DETECTED Final   Coronavirus NL63 NOT DETECTED NOT DETECTED Final   Coronavirus OC43 NOT DETECTED NOT DETECTED Final   Metapneumovirus NOT DETECTED NOT DETECTED Final   Rhinovirus / Enterovirus NOT DETECTED NOT DETECTED Final   Influenza A NOT DETECTED NOT DETECTED Final   Influenza B NOT DETECTED NOT DETECTED Final   Parainfluenza Virus 1 NOT DETECTED NOT DETECTED Final   Parainfluenza Virus 2 NOT DETECTED NOT DETECTED Final   Parainfluenza Virus 3 NOT DETECTED NOT DETECTED Final   Parainfluenza Virus 4 NOT DETECTED NOT DETECTED Final   Respiratory Syncytial Virus NOT DETECTED NOT DETECTED Final   Bordetella pertussis NOT DETECTED NOT DETECTED Final   Bordetella  Parapertussis NOT DETECTED NOT DETECTED Final   Chlamydophila pneumoniae NOT DETECTED NOT DETECTED Final   Mycoplasma  pneumoniae NOT DETECTED NOT DETECTED Final    Comment: Performed at [facility], [address] [place], [place] [address]  Culture, group A strep     Status: None   Collection Time: 02/17/21 11:24 AM   Specimen: Throat  Result Value Ref Range Status   Specimen Description THROAT  Final   Special Requests Normal  Final   Culture   Final    NO GROUP A STREP (S.PYOGENES) ISOLATED Performed at [facility], [address] [place], [place] [address]    Report Status 02/19/2021 FINAL  Final    Radiology Reports CT ABDOMEN PELVIS WO CONTRAST  Result Date: 02/16/2021 CLINICAL DATA:  Abdominal pain. EXAM: CT ABDOMEN AND PELVIS WITHOUT CONTRAST TECHNIQUE: Multidetector CT imaging of the abdomen and pelvis was performed following the standard protocol without IV contrast. COMPARISON:  None. FINDINGS: Lower chest: No acute abnormality. Hepatobiliary: No focal liver abnormality is seen. No gallstones, gallbladder wall thickening, or biliary dilatation. Pancreas: Unremarkable. No pancreatic ductal dilatation or surrounding inflammatory changes. Spleen: Normal in size without focal abnormality. Adrenals/Urinary Tract: Adrenal glands appear normal. No hydronephrosis or renal obstruction is noted. No renal or ureteral calculi are noted. Urinary bladder is decompressed secondary to Foley catheter. Minimal inflammatory changes are noted around the right kidney suggesting possible pyelonephritis. Stomach/Bowel: Stomach is within normal limits. Appendix appears normal. No evidence of bowel wall thickening, distention, or inflammatory changes. Vascular/Lymphatic: No significant vascular findings are present. No enlarged abdominal or pelvic lymph nodes. Reproductive: No adnexal abnormality is noted. Probable small exophytic uterine fibroid is noted arising posteriorly from the uterus.  Other: No abdominal wall hernia or abnormality. No abdominopelvic ascites. Musculoskeletal: No acute or significant osseous findings. IMPRESSION: 1. Minimal inflammatory changes are noted around the right kidney suggesting possible pyelonephritis. 2. No hydronephrosis or renal obstruction is noted. No renal or ureteral calculi are noted. 3. Probable small exophytic uterine fibroid is noted arising posteriorly from the uterus. Electronically Signed   By: Lupita Raider M.D.   On: 02/16/2021 18:32   CT Angio Head W or Wo Contrast  Result Date: 01/27/2021 CLINICAL DATA:  Diplopia.  Dural sinus thrombosis suspected. EXAM: CT ANGIOGRAPHY HEAD CT VENOGRAM HEAD TECHNIQUE: Multidetector CT imaging of the head was performed using the standard protocol during bolus administration of intravenous contrast. Multiplanar CT image reconstructions and MIPs were obtained to evaluate the arterial anatomy. Additional delayed multidetector CT imaging of the head was performed with multiplanar CT image reconstructions and MIPS to evaluate the venous anatomy. CONTRAST:  OMNIPAQUE IOHEXOL 350 MG/ML SOLN COMPARISON:  None. FINDINGS: CT HEAD Brain: No evidence of acute large vascular territory infarction, acute hemorrhage, hydrocephalus, extra-axial collection or mass lesion/mass effect. Mild narrowing of the mamillopontine distance and effacement of the suprasellar cistern with trace tonsillar ectopia. Skull: No acute fracture. Sinuses: Visualized sinuses are clear. Orbits: No acute finding. CTA HEAD Due to venous timing, significantly limited evaluation. The intradural left vertebral artery is poorly opacified, which could relate to stenosis or non dominant vertebral artery. Otherwise, no evidence of proximal large vessel occlusion or large aneurysm in the anterior or posterior circulation. CT VENOGRAM No evidence of dural sinus thrombosis. Small left transverse sinus and narrowing of the distal right transverse sinus. Small  rounded filling defect in the distal right transverse sinus likely represents arachnoid granulation. IMPRESSION: CT head: 1. No definite evidence of acute intracranial abnormality. 2. Mild narrowing of the mamillopontine distance and effacement of the suprasellar cistern  with trace tonsillar ectopia. Given the patient is young and has relative paucity of CSF spaces/sulci diffusely these findings may be within normal limits, but recommend correlation with signs/symptoms of intracranial hypotension including positional headaches. Additionally, per Dr. Rush Landmark the patient is going to get a follow-up MRI which can further evaluate these findings. CTA head: Due to venous timing, significantly limited evaluation. The intradural left vertebral artery is not well opacified, which is poorly evaluated and could relate to nondominant vertebral artery or stenosis. A CTA neck could further evaluate if clinically indicated. Otherwise, no evidence of proximal large vessel occlusion or large aneurysm. A CTA of the neck could further evaluate the vertebral finding and a repeat CTA head could provide more sensitive intracranial arterial evaluation if clinically indicated. CTV: No evidence of dural sinus thrombosis. Findings discussed with Dr. Julieanne Manson at 3:05 PM via telephone. Electronically Signed   By: Feliberto Harts MD   On: 01/27/2021 15:15   DG Chest 2 View  Result Date: 02/16/2021 CLINICAL DATA:  Shortness of breath. EXAM: CHEST - 2 VIEW COMPARISON:  January 27, 2021. FINDINGS: The heart size and mediastinal contours are within normal limits. Both lungs are clear. No pneumothorax or pleural effusion is noted. The visualized skeletal structures are unremarkable. IMPRESSION: No active cardiopulmonary disease. Electronically Signed   By: Lupita Raider M.D.   On: 02/16/2021 12:53   US RENAL  Result Date: 02/21/2021 CLINICAL DATA:  Acute renal insufficiency EXAM: RENAL / URINARY TRACT ULTRASOUND COMPLETE COMPARISON:   02/20/2021 right upper quadrant ultrasound. Renal ultrasound 02/16/2021. FINDINGS: Right Kidney: Renal measurements: 12.3 by 6.0 x 5.5 cm = volume: 213 mL. Increased renal echogenicity. No hydronephrosis. Left Kidney: Renal measurements: 10.0 x 5.3 x 4.4 cm = volume: 128 mL. Increased renal echogenicity. No hydronephrosis. Bladder: Collapsed around a Foley catheter. Other: None. IMPRESSION: No hydronephrosis. Increased renal echogenicity, suggesting medical renal disease. Electronically Signed   By: Jeronimo Greaves M.D.   On: 02/21/2021 21:36   US Renal  Result Date: 02/16/2021 CLINICAL DATA:  Renal failure EXAM: RENAL / URINARY TRACT ULTRASOUND COMPLETE COMPARISON:  CT 02/16/2021 FINDINGS: Right Kidney: Renal measurements: 11.9 x 5.8 x 4.4 cm = volume: 159.2 mL. Cortex is echogenic. No mass or hydronephrosis. Trace perinephric fluid Left Kidney: Renal measurements: 9.7 x 5.2 x 4.9 cm = volume: 129.5 mL. Cortex is echogenic. No mass or hydronephrosis. Bladder: Empty by Foley catheter. Other: None. IMPRESSION: Echogenic kidneys bilaterally consistent with medical renal disease. No hydronephrosis Electronically Signed   By: Jasmine Pang M.D.   On: 02/16/2021 20:23   IR Fluoro Guide CV Line Right  Result Date: 02/18/2021 INDICATION: 55 year old female referred for temporary hemodialysis catheter placement EXAM: IMAGE GUIDED PLACEMENT OF TEMPORARY HEMODIALYSIS CATHETER MEDICATIONS: None ANESTHESIA/SEDATION: Moderate (conscious) sedation was employed during this procedure. A total of Versed 1.0 mg and Fentanyl 50 mcg was administered intravenously. Moderate Sedation Time: 13 minutes. The patient's level of consciousness and vital signs were monitored continuously by radiology nursing throughout the procedure under my direct supervision. FLUOROSCOPY TIME:  Fluoroscopy Time: 0 minutes 6 seconds (1 mGy). COMPLICATIONS: None PROCEDURE: Informed written consent was obtained from the patient and the patient's family  after a thorough discussion of the procedural risks, benefits and alternatives. All questions were addressed. A timeout was performed prior to the initiation of the procedure. The right neck and chest was prepped with chlorhexidine, and draped in the usual sterile fashion using maximum barrier technique (cap and mask, sterile gown, sterile gloves,  large sterile sheet, hand hygiene and cutaneous antiseptic). Local anesthesia was attained by infiltration with 1% lidocaine without epinephrine. Ultrasound demonstrated patency of the right internal jugular vein, and this was documented with an image. Under real-time ultrasound guidance, this vein was accessed with a 21 gauge micropuncture needle and image documentation was performed. A small dermatotomy was made at the access site with an 11 scalpel. A 0.018" wire was advanced into the SVC and the access needle exchanged for a 26F micropuncture vascular sheath. The 0.018" wire was then removed and a 0.035" wire advanced into the IVC. Upon withdrawal of the 018 wire, the wire was marked for appropriate length of the internal portion of the catheter. A 16 cm catheter was selected. Skin and subcutaneous tissues were serially dilated. Catheter was placed on the wire. The catheter tip is positioned in the upper right atrium. This was documented with a spot image. Both ports of the hemodialysis catheter were then tested for excellent function. The ports were then locked with heparinized lock. Patient tolerated the procedure well and remained hemodynamically stable throughout. No complications were encountered and no significant blood loss was encountered. IMPRESSION: Status post image guided placement of temporary hemodialysis catheter. Catheter may be converted if needed. Signed, Yvone Neu. Reyne Dumas, RPVI [facility] Electronically Signed   By: Gilmer Mor D.O.   On: 02/18/2021 14:21   IR US Guide Vasc Access  Right  Result Date: 02/18/2021 INDICATION: 55 year old female referred for temporary hemodialysis catheter placement EXAM: IMAGE GUIDED PLACEMENT OF TEMPORARY HEMODIALYSIS CATHETER MEDICATIONS: None ANESTHESIA/SEDATION: Moderate (conscious) sedation was employed during this procedure. A total of Versed 1.0 mg and Fentanyl 50 mcg was administered intravenously. Moderate Sedation Time: 13 minutes. The patient's level of consciousness and vital signs were monitored continuously by radiology nursing throughout the procedure under my direct supervision. FLUOROSCOPY TIME:  Fluoroscopy Time: 0 minutes 6 seconds (1 mGy). COMPLICATIONS: None PROCEDURE: Informed written consent was obtained from the patient and the patient's family after a thorough discussion of the procedural risks, benefits and alternatives. All questions were addressed. A timeout was performed prior to the initiation of the procedure. The right neck and chest was prepped with chlorhexidine, and draped in the usual sterile fashion using maximum barrier technique (cap and mask, sterile gown, sterile gloves, large sterile sheet, hand hygiene and cutaneous antiseptic). Local anesthesia was attained by infiltration with 1% lidocaine without epinephrine. Ultrasound demonstrated patency of the right internal jugular vein, and this was documented with an image. Under real-time ultrasound guidance, this vein was accessed with a 21 gauge micropuncture needle and image documentation was performed. A small dermatotomy was made at the access site with an 11 scalpel. A 0.018" wire was advanced into the SVC and the access needle exchanged for a 26F micropuncture vascular sheath. The 0.018" wire was then removed and a 0.035" wire advanced into the IVC. Upon withdrawal of the 018 wire, the wire was marked for appropriate length of the internal portion of the catheter. A 16 cm catheter was selected. Skin and subcutaneous tissues were serially dilated. Catheter was placed  on the wire. The catheter tip is positioned in the upper right atrium. This was documented with a spot image. Both ports of the hemodialysis catheter were then tested for excellent function. The ports were then locked with heparinized lock. Patient tolerated the procedure well and remained hemodynamically stable throughout. No complications were encountered and no significant blood loss was encountered. IMPRESSION: Status post  image guided placement of temporary hemodialysis catheter. Catheter may be converted if needed. Signed, Yvone Neu. Reyne Dumas, RPVI [facility] Electronically Signed   By: Gilmer Mor D.O.   On: 02/18/2021 14:21   DG Chest Portable 1 View  Result Date: 01/27/2021 CLINICAL DATA:  Persistent cough. Recurrent upper respiratory infection EXAM: PORTABLE CHEST 1 VIEW COMPARISON:  Radiograph 06/27/2016 FINDINGS: 1355 hours. The heart size and mediastinal contours are normal. The lungs are clear. There is no pleural effusion or pneumothorax. No acute osseous findings are identified. Telemetry leads overlie the chest. IMPRESSION: No active cardiopulmonary process. Electronically Signed   By: Carey Bullocks M.D.   On: 01/27/2021 14:47   ECHOCARDIOGRAM COMPLETE  Result Date: 02/17/2021    ECHOCARDIOGRAM REPORT   Patient Name:   CRESENCIA ASMUS Date of Exam: 02/17/2021 Medical Rec #:  [id number]       Height:       65.0 in Accession #:    [id number]      Weight:       240.0 lb Date of Birth:  17-Jul-1966        BSA:          2.138 m Patient Age:    54 years        BP:           120/60 mmHg Patient Gender: F               HR:           67 bpm. Exam Location:  Inpatient Procedure: 2D Echo Indications:    CHF-Acute Diastolic I50.31  History:        Patient has no prior history of Echocardiogram examinations.  Sonographer:    Thurman Coyer RDCS (AE) Referring Phys: [id number] [facility] [place] [facility] IMPRESSIONS  1. Left ventricular ejection fraction, by  estimation, is 60%. The left ventricle has normal function. The left ventricle has no regional wall motion abnormalities. There is mild left ventricular hypertrophy. Left ventricular diastolic parameters were low normal.  2. Right ventricular systolic function is normal. The right ventricular size is normal. Tricuspid regurgitation signal is inadequate for assessing PA pressure.  3. The mitral valve is normal in structure. Trivial mitral valve regurgitation. No evidence of mitral stenosis.  4. The aortic valve is tricuspid. Aortic valve regurgitation is not visualized. No aortic stenosis is present.  5. The inferior vena cava is normal in size with greater than 50% respiratory variability, suggesting right atrial pressure of 3 mmHg. FINDINGS  Left Ventricle: Left ventricular ejection fraction, by estimation, is 60%. The left ventricle has normal function. The left ventricle has no regional wall motion abnormalities. The left ventricular internal cavity size was normal in size. There is mild left ventricular hypertrophy. Left ventricular diastolic parameters were normal. Right Ventricle: The right ventricular size is normal. No increase in right ventricular wall thickness. Right ventricular systolic function is normal. Tricuspid regurgitation signal is inadequate for assessing PA pressure. Left Atrium: Left atrial size was normal in size. Right Atrium: Right atrial size was normal in size. Pericardium: There is no evidence of pericardial effusion. Mitral Valve: The mitral valve is normal in structure. Trivial mitral valve regurgitation. No evidence of mitral valve stenosis. Tricuspid Valve: The tricuspid valve is normal in structure. Tricuspid valve regurgitation is trivial. No evidence of tricuspid stenosis. Aortic Valve: The aortic valve is tricuspid. Aortic valve regurgitation is not visualized. No aortic stenosis is present. Pulmonic Valve: The pulmonic  valve was normal in structure. Pulmonic valve regurgitation  is trivial. No evidence of pulmonic stenosis. Aorta: The aortic root is normal in size and structure. Venous: The inferior vena cava is normal in size with greater than 50% respiratory variability, suggesting right atrial pressure of 3 mmHg. IAS/Shunts: The interatrial septum was not well visualized.  LEFT VENTRICLE PLAX 2D LVIDd:         4.10 cm  Diastology LVIDs:         2.80 cm  LV e' medial:    6.53 cm/s LV PW:         1.20 cm  LV E/e' medial:  11.0 LV IVS:        1.20 cm  LV e' lateral:   9.57 cm/s LVOT diam:     2.10 cm  LV E/e' lateral: 7.5 LV SV:         76 LV SV Index:   36 LVOT Area:     3.46 cm  RIGHT VENTRICLE RV S prime:     15.40 cm/s TAPSE (M-mode): 1.4 cm LEFT ATRIUM             Index       RIGHT ATRIUM          Index LA diam:        3.10 cm 1.45 cm/m  RA Area:     9.08 cm LA Vol (A2C):   27.6 ml 12.91 ml/m RA Volume:   15.80 ml 7.39 ml/m LA Vol (A4C):   39.6 ml 18.53 ml/m LA Biplane Vol: 35.3 ml 16.51 ml/m  AORTIC VALVE LVOT Vmax:   107.00 cm/s LVOT Vmean:  64.800 cm/s LVOT VTI:    0.220 m  AORTA Ao Root diam: 2.80 cm MITRAL VALVE MV Area (PHT): 2.42 cm    SHUNTS MV Decel Time: 313 msec    Systemic VTI:  0.22 m MV E velocity: 71.60 cm/s  Systemic Diam: 2.10 cm MV A velocity: 73.30 cm/s MV E/A ratio:  0.98 Weston Brass MD Electronically signed by Weston Brass MD Signature Date/Time: 02/17/2021/1:13:33 PM    Final    US BIOPSY (KIDNEY)  Result Date: 02/20/2021 INDICATION: 7 year old with acute kidney injury and request for renal biopsy. EXAM: ULTRASOUND-GUIDED RANDOM RENAL BIOPSY MEDICATIONS: Moderate sedation none. ANESTHESIA/SEDATION: Moderate (conscious) sedation was employed during this procedure. A total of Versed 1.0 mg and Fentanyl 50 mcg was administered intravenously. Moderate Sedation Time: 15 minutes. The patient's level of consciousness and vital signs were monitored continuously by radiology nursing throughout the procedure under my direct supervision. FLUOROSCOPY TIME:   None COMPLICATIONS: None immediate. PROCEDURE: Informed written consent was obtained from the patient after a thorough discussion of the procedural risks, benefits and alternatives. All questions were addressed. A timeout was performed prior to the initiation of the procedure. Patient was placed prone. Both kidneys were evaluated with ultrasound. The right kidney was targeted for biopsy. The right flank was prepped with chlorhexidine and sterile field was created. Maximal barrier sterile technique was utilized including caps, mask, sterile gowns, sterile gloves, sterile drape, hand hygiene and skin antiseptic. Skin was anesthetized using 1% lidocaine. A small incision was made. Using ultrasound guidance, 16 gauge core biopsy was directed into the right kidney lower pole. Adequate specimen was obtained and placed in saline. A second ultrasound-guided core biopsy was obtained from the right kidney lower pole. Adequate specimen was obtained and placed in saline. Bandage placed over the puncture site. FINDINGS: Negative for hydronephrosis. Two core biopsies  obtained from the right kidney lower pole. No significant bleeding or hematoma formation. IMPRESSION: Successful ultrasound-guided random core biopsies from the right kidney lower pole. Electronically Signed   By: Richarda Overlie M.D.   On: 02/20/2021 17:20   CT VENOGRAM HEAD  Result Date: 01/27/2021 CLINICAL DATA:  Diplopia.  Dural sinus thrombosis suspected. EXAM: CT ANGIOGRAPHY HEAD CT VENOGRAM HEAD TECHNIQUE: Multidetector CT imaging of the head was performed using the standard protocol during bolus administration of intravenous contrast. Multiplanar CT image reconstructions and MIPs were obtained to evaluate the arterial anatomy. Additional delayed multidetector CT imaging of the head was performed with multiplanar CT image reconstructions and MIPS to evaluate the venous anatomy. CONTRAST:  OMNIPAQUE IOHEXOL 350 MG/ML SOLN COMPARISON:  None. FINDINGS: CT  HEAD Brain: No evidence of acute large vascular territory infarction, acute hemorrhage, hydrocephalus, extra-axial collection or mass lesion/mass effect. Mild narrowing of the mamillopontine distance and effacement of the suprasellar cistern with trace tonsillar ectopia. Skull: No acute fracture. Sinuses: Visualized sinuses are clear. Orbits: No acute finding. CTA HEAD Due to venous timing, significantly limited evaluation. The intradural left vertebral artery is poorly opacified, which could relate to stenosis or non dominant vertebral artery. Otherwise, no evidence of proximal large vessel occlusion or large aneurysm in the anterior or posterior circulation. CT VENOGRAM No evidence of dural sinus thrombosis. Small left transverse sinus and narrowing of the distal right transverse sinus. Small rounded filling defect in the distal right transverse sinus likely represents arachnoid granulation. IMPRESSION: CT head: 1. No definite evidence of acute intracranial abnormality. 2. Mild narrowing of the mamillopontine distance and effacement of the suprasellar cistern with trace tonsillar ectopia. Given the patient is young and has relative paucity of CSF spaces/sulci diffusely these findings may be within normal limits, but recommend correlation with signs/symptoms of intracranial hypotension including positional headaches. Additionally, per Dr. Rush Landmark the patient is going to get a follow-up MRI which can further evaluate these findings. CTA head: Due to venous timing, significantly limited evaluation. The intradural left vertebral artery is not well opacified, which is poorly evaluated and could relate to nondominant vertebral artery or stenosis. A CTA neck could further evaluate if clinically indicated. Otherwise, no evidence of proximal large vessel occlusion or large aneurysm. A CTA of the neck could further evaluate the vertebral finding and a repeat CTA head could provide more sensitive intracranial arterial  evaluation if clinically indicated. CTV: No evidence of dural sinus thrombosis. Findings discussed with Dr. Julieanne Manson at 3:05 PM via telephone. Electronically Signed   By: Feliberto Harts MD   On: 01/27/2021 15:15   US Abdomen Limited RUQ (LIVER/GB)  Result Date: 02/20/2021 CLINICAL DATA:  Transaminitis EXAM: ULTRASOUND ABDOMEN LIMITED RIGHT UPPER QUADRANT COMPARISON:  CT abdomen and pelvis 02/16/2021 FINDINGS: Gallbladder: Normally distended. 6 mm nonshadowing intraluminal echogenic focus likely small polyp; no follow-up imaging recommended. No gallbladder wall thickening, pericholecystic fluid or sonographic Murphy sign. No discrete shadowing calculi. Common bile duct: Diameter: 2 mm Liver: Slightly heterogeneous echogenicity, nonspecific. No discrete hepatic mass or nodularity. No intrahepatic biliary dilatation. Portal vein is patent on color Doppler imaging with normal direction of blood flow towards the liver. Other: No RIGHT upper quadrant free fluid. IMPRESSION: 6 mm gallbladder polyp; no follow-up imaging recommended. Nonspecific mildly heterogeneous hepatic parenchymal echogenicity without mass or biliary dilatation. Electronically Signed   By: Ulyses Southward M.D.   On: 02/20/2021 18:13

## 2021-02-22 NOTE — Progress Notes (Signed)
OT Cancellation Note  Patient Details Name: Denise Watson MRN: [id number] DOB: 02-Jul-1966   Cancelled Treatment:    Reason Eval/Treat Not Completed: Pain limiting ability to participate. Pt resting in bed, reports "11"/10 abdominal pain. Pt requests to rest for now. Per RN, she was able to sit up in recliner early today. Plan to reattempt another time/date.  Raynald Kemp, OT Acute Rehabilitation Services Pager: [phone] Office: [phone]  02/22/2021, 12:53 PM

## 2021-02-23 DIAGNOSIS — R7401 Elevation of levels of liver transaminase levels: Secondary | ICD-10-CM | POA: Diagnosis not present

## 2021-02-23 DIAGNOSIS — N179 Acute kidney failure, unspecified: Secondary | ICD-10-CM | POA: Diagnosis not present

## 2021-02-23 LAB — BASIC METABOLIC PANEL
Anion gap: 11 (ref 5–15)
BUN: 73 mg/dL — ABNORMAL HIGH (ref 6–20)
CO2: 23 mmol/L (ref 22–32)
Calcium: 7.6 mg/dL — ABNORMAL LOW (ref 8.9–10.3)
Chloride: 96 mmol/L — ABNORMAL LOW (ref 98–111)
Creatinine, Ser: 10.72 mg/dL — ABNORMAL HIGH (ref 0.44–1.00)
GFR, Estimated: 4 mL/min — ABNORMAL LOW (ref >60)
Glucose, Bld: 171 mg/dL — ABNORMAL HIGH (ref 70–99)
Potassium: 5 mmol/L (ref 3.5–5.1)
Sodium: 130 mmol/L — ABNORMAL LOW (ref 135–145)

## 2021-02-23 LAB — HEPATIC FUNCTION PANEL
ALT: 94 U/L — ABNORMAL HIGH (ref 0–44)
AST: 106 U/L — ABNORMAL HIGH (ref 15–41)
Albumin: 1.9 g/dL — ABNORMAL LOW (ref 3.5–5.0)
Alkaline Phosphatase: 75 U/L (ref 38–126)
Bilirubin, Direct: 0.1 mg/dL (ref 0.0–0.2)
Indirect Bilirubin: 0.5 mg/dL (ref 0.3–0.9)
Total Bilirubin: 0.6 mg/dL (ref 0.3–1.2)
Total Protein: 5.7 g/dL — ABNORMAL LOW (ref 6.5–8.1)

## 2021-02-23 LAB — PROTEIN, URINE, 24 HOUR
Collection Interval-UPROT: 24 hours
Protein, 24H Urine: 283 mg/d — ABNORMAL HIGH (ref 50–100)
Protein, Urine: 2828 mg/dL
Urine Total Volume-UPROT: 10 mL

## 2021-02-23 MED ORDER — DEXTROSE 5 % IV SOLN
3.0000 g | INTRAVENOUS | Status: DC
  Filled 2021-02-23 (×2): qty 3000

## 2021-02-23 MED ORDER — HEPARIN SODIUM (PORCINE) 5000 UNIT/ML IJ SOLN
5000.0000 [IU] | Freq: Three times a day (TID) | INTRAMUSCULAR | Status: DC
  Administered 2021-02-23 – 2021-02-27 (×8): 5000 [IU] via SUBCUTANEOUS
  Filled 2021-02-23 (×11): qty 1

## 2021-02-23 MED ORDER — MYCOPHENOLATE MOFETIL 250 MG PO CAPS
250.0000 mg | ORAL_CAPSULE | Freq: Two times a day (BID) | ORAL | Status: DC
  Administered 2021-02-23 – 2021-03-01 (×11): 250 mg via ORAL
  Filled 2021-02-23 (×14): qty 1

## 2021-02-23 MED ORDER — ONDANSETRON HCL 4 MG/2ML IJ SOLN
INTRAMUSCULAR | Status: AC
  Administered 2021-02-23: 4 mg via INTRAVENOUS
  Filled 2021-02-23: qty 2

## 2021-02-23 NOTE — Progress Notes (Signed)
PROGRESS NOTE                                                                                                                                                                                                             Patient Demographics:    Denise Watson, is a 55 y.o. female, DOB - 11-30-1966, WUJ:[id number]  Outpatient Primary MD for the patient is Piedad Climes, [place]    LOS - 7  Admit date - 02/16/2021    Chief Complaint  Patient presents with  . Shortness of Breath       Brief Narrative (HPI from H&P)  -  Denise Watson  is a 55 y.o. female, who is in relatively good health except recent diagnosis of essential hypertension requiring HCTZ for the last few weeks, obesity, COVID-19 infection in January, psoriasis, C-section in the past, intermittent palpitations in the past.  Apparently patient has been having symptoms of sore throat for the last 2 weeks and subsequently has been developing gradual fatigue and some exertional shortness of breath, denies any fever chills, no chest or abdominal pain, no blood in stool or urine, no cough or sinus pressure, no swelling in arms or legs, she does have petechial rash but she says this is due to her psoriasis and is chronic.  She presented to the ER where she was found to have AKI with a creatinine of 10, nephrology was consulted and I was requested to admit the patient.  Currently review of systems positive for sore throat, fatigue, exertional shortness of breath, chronic petechial rash due to psoriasis according to the patient.  Other review of systems are negative   Subjective:   Patient sitting in a chair this morning, when she had complaints of abdominal pain in the right side which has resolved, denies nausea, vomiting or diarrhea .    Assessment  & Plan :   Generalized fatigue, exertional shortness of breath and pharyngitis.   - In a patient with AKI, she was  recently diagnosed with hypertension and started with HCTZ as well.  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 - will discontinue Foley catheter   Petechial rash.  - Patient claims this is chronic due to psoriasis but could be due to platelet dysfunction caused by AKI.  Will monitor.  Platelet counts are stable pressure stable.  Borderline hyperkalemia.  - Stable after kayaxalate and HD.  Exertional shortness of breath -  likely due to AKI, stable TTE. HD now for any extra fluid removal.    Obesity.  BMI 39.  Follow with PCP.  Hypertension.  Gentle IV hydralazine, will avoid hypotension due to AKI.  Pharyngitis.  Strep throat culture negative, strep urinary antigen negative, ASO titer negative, likely viral pharyngitis  History of psoriasis.  Question if she has discoid lupus.  Outpatient dermatology follow-up.  Asymptomatic mild rise in LFTs.  Would be due to hepatic congestion along with acute hepatitis panel negative, she is symptom-free.Stable RUQ [place] with incidental gallbladder polyp, outpatient follow-up with PCP.  LFTs trending down.       Condition -   Guarded  Family Communication  : none at bedside  Code Status :  Full  Consults  :  Renal  PUD Prophylaxis :    Procedures  :     RUQ US - GB Polyp  R. Kidney biopsy 02/20/21  R.IJ HD Cath by IR 02/18/21  Renal US - medical renal disease.  TTE - 1. Left ventricular ejection fraction, by estimation, is 60%. The left ventricle has normal function. The left ventricle  has no regional wall motion abnormalities. There is mild left ventricular hypertrophy. Left ventricular diastolic parameters were low normal.  2. Right ventricular systolic function is normal. The right ventricular size is normal. Tricuspid regurgitation signal is inadequate for assessing PA pressure.  3. The mitral valve is normal in structure. Trivial mitral valve regurgitation. No evidence of mitral stenosis.  4. The aortic valve is tricuspid. Aortic valve regurgitation is not visualized. No aortic stenosis is present.  5. The inferior vena cava is normal in size with greater than 50% respiratory variability, suggesting right atrial pressure of 3 mmHg.       Disposition Plan  :    Status is: Inpatient  Remains inpatient appropriate because:IV treatments appropriate due to intensity of illness or inability to take PO   Dispo: The patient is from: Home              Anticipated d/c is to: Home              Patient currently is not medically stable to d/c.   Difficult to place patient No  DVT Prophylaxis  :  Heparin   Lab Results  Component Value Date   PLT 143 (L) 02/21/2021    Diet :  Diet Order            Diet renal/carb modified with fluid restriction Diet-HS Snack? Nothing; Fluid restriction: Other (see comments); Room service appropriate? Yes; Fluid consistency: Thin  Diet effective now                  Inpatient Medications  Scheduled Meds: . Chlorhexidine Gluconate Cloth  6 each Topical Q0600  . famotidine  10 mg Oral Daily  . heparin injection (subcutaneous)  5,000 Units Subcutaneous Q8H  . polyethylene glycol  17 g Oral Daily  . predniSONE  60 mg Oral Q breakfast   Continuous Infusions:  PRN Meds:.acetaminophen **OR** [DISCONTINUED] acetaminophen, alum & mag hydroxide-simeth, bisacodyl, docusate sodium, hydrALAZINE, [DISCONTINUED] ondansetron **OR** ondansetron (ZOFRAN) IV, phenol, sodium chloride flush, sodium phosphate, traMADol  Antibiotics  :     Anti-infectives (From admission, onward)   None       Huey Bienenstock M.D on 02/23/2021 at 12:17 PM  To page go to [email or web address]   [facility] -  Office  [phone]   See all Orders from today for further details    Objective:   Vitals:   02/21/21 2032 02/22/21 1453 02/22/21 2055 02/23/21 0456  BP: 117/60 139/71 (!) 155/92 (!) 152/88  Pulse:  64  68  Resp: Temp: 98.3 F (36.8 C) 98 F (36.7 C) 98.3 F (36.8 C) 98.3 F (36.8 C)  TempSrc: Axillary Axillary Axillary Axillary  SpO2: 96% 97% 98% 98%  Weight:      Height:        Wt Readings from Last 3 Encounters:  02/21/21 134.5 kg  01/27/21 108.9 kg  06/27/16 132 kg     Intake/Output Summary (Last 24 hours) at 02/23/2021 1217 Last data filed at 02/23/2021 0500 Gross per 24 hour  Intake 20 ml  Output 75 ml  Net -55 ml     Physical Exam  Awake Alert, Oriented X 3, No new F.N deficits, Normal affect Symmetrical Chest wall movement, Good air movement bilaterally, CTAB RRR,No Gallops,Rubs or new Murmurs, No Parasternal Heave +ve B.Sounds, Abd Soft, No tenderness, No rebound - guarding or rigidity. No Cyanosis, petechial rash and discoid rash, - foley, R IJ HD Cath   Data Review:    CBC Recent Labs  Lab 02/17/21 0047 02/18/21 0230 02/18/21 1420 02/19/21 0206 02/20/21 0310 02/21/21 1137  WBC 3.2* 3.1* 3.3* 5.3 7.0 9.7  HGB 11.8* 10.8* 12.1 11.4* 10.8* 11.0*  HCT 36.5 32.2* 35.7* 32.9* 32.1* 33.8*  PLT 214 190 254 100* 93* 143*  MCV 89.7 88.5 88.4 87.5 88.7 89.9  MCH 29.0 29.7 30.0 30.3 29.8 29.3  MCHC 32.3 33.5 33.9 34.7 33.6 32.5  RDW 13.4 13.6 13.8 13.6 13.6 13.8  LYMPHSABS 0.4* 1.1  --  1.0 1.1  --   MONOABS 0.2 0.3  --  0.2 0.6  --   EOSABS 0.0 0.0  --  0.0 0.0  --   BASOSABS 0.0 0.0  --  0.0 0.0  --     Recent Labs  Lab 02/16/21 1940 02/17/21 0047 02/18/21 0230 02/18/21 1420 02/19/21 0206 02/20/21 0310 02/21/21 1137 02/22/21 0601 02/23/21 0357  NA  --   131* 131* 132* 131* 130* 131*  --  130*  K  --  4.1 4.1 4.8 4.5 4.5 4.8  --  5.0  CL  --  97* 99 98 97* 97* 97*  --  96*  CO2  --  21* 18* 18* 20* 22 20*  --  23  GLUCOSE  --  105* 99 103* 139* 152* 144*  --  171*  BUN  --  105* 114* 119* 81* 71* 98*  --  73*  CREATININE  --  10.53* 11.93* 12.28* 9.57* 9.47* 11.78*  --  10.72*  CALCIUM  --  7.4* 7.1* 7.2* 7.1* 7.7* 7.9*  --  7.6*  AST 123* 127* 132*  --  110* 163*  --  158* 106*  ALT 62* 63* 65*  --  55* 81*  --  116* 94*  ALKPHOS 69 68 69  --  74 78  --  81  75  BILITOT 1.3* 1.3* 1.1  --  0.5 0.6  --  0.7 0.6  ALBUMIN 1.5* 1.5* 1.4* 1.5* 1.3* 1.6* 1.8* 2.0* 1.9*  MG  --  2.3 2.3  --  2.0 2.2  --   --   --   INR  --  1.0  --   --   --   --   --   --   --   TSH 1.836  --   --   --   --   --   --   --   --   HGBA1C 6.3*  --   --   --   --   --   --   --   --   BNP  --  17.6 13.6  --  23.5 34.5  --   --   --     ------------------------------------------------------------------------------------------------------------------ No results for input(s): CHOL, HDL, LDLCALC, TRIG, CHOLHDL, LDLDIRECT in the last 72 hours.  Lab Results  Component Value Date   HGBA1C 6.3 (H) 02/16/2021   ------------------------------------------------------------------------------------------------------------------ No results for input(s): TSH, T4TOTAL, T3FREE, THYROIDAB in the last 72 hours.  Invalid input(s): FREET3  Cardiac Enzymes No results for input(s): CKMB, TROPONINI, MYOGLOBIN in the last 168 hours.  Invalid input(s): CK ------------------------------------------------------------------------------------------------------------------    Component Value Date/Time   BNP 34.5 02/20/2021 0310    Micro Results Recent Results (from the past 240 hour(s))  SARS CORONAVIRUS 2 (TAT 6-24 HRS) Nasopharyngeal Nasopharyngeal Swab     Status: None   Collection Time: 02/16/21  4:25 PM   Specimen: Nasopharyngeal Swab  Result Value Ref Range Status   SARS  Coronavirus 2 NEGATIVE NEGATIVE Final    Comment: (NOTE) SARS-CoV-2 target nucleic acids are NOT DETECTED.  The SARS-CoV-2 RNA is generally detectable in upper and lower respiratory specimens during the acute phase of infection. Negative results do not preclude SARS-CoV-2 infection, do not rule out co-infections with other pathogens, and should not be used as the sole basis for treatment or other patient management decisions. Negative results must be combined with clinical observations, patient history, and epidemiological information. The expected result is Negative.  Fact Sheet for Patients: HairSlick.no  Fact Sheet for Healthcare Providers: quierodirigir.com  This test is not yet approved or cleared by the [place] FDA and  has been authorized for detection and/or diagnosis of SARS-CoV-2 by FDA under an Emergency Use Authorization (EUA). This EUA will remain  in effect (meaning this test can be used) for the duration of the COVID-19 declaration under Se ction 564(b)(1) of the Act, 21 U.S.C. section 360bbb-3(b)(1), unless the authorization is terminated or revoked sooner.  Performed at [facility], [address] [place], [place] [address]   Respiratory (~20 pathogens) panel by PCR     Status: None   Collection Time: 02/16/21  5:06 PM   Specimen: Nasopharyngeal Swab; Respiratory  Result Value Ref Range Status   Adenovirus NOT DETECTED NOT DETECTED Final   Coronavirus 229E NOT DETECTED NOT DETECTED Final    Comment: (NOTE) The Coronavirus on the Respiratory Panel, DOES NOT test for the novel  Coronavirus (2019 nCoV)    Coronavirus HKU1 NOT DETECTED NOT DETECTED Final   Coronavirus NL63 NOT DETECTED NOT DETECTED Final   Coronavirus OC43 NOT DETECTED NOT DETECTED Final   Metapneumovirus NOT DETECTED NOT DETECTED Final   Rhinovirus / Enterovirus NOT DETECTED NOT DETECTED Final   Influenza A NOT DETECTED NOT  DETECTED Final   Influenza  B NOT DETECTED NOT DETECTED Final   Parainfluenza Virus 1 NOT DETECTED NOT DETECTED Final   Parainfluenza Virus 2 NOT DETECTED NOT DETECTED Final   Parainfluenza Virus 3 NOT DETECTED NOT DETECTED Final   Parainfluenza Virus 4 NOT DETECTED NOT DETECTED Final   Respiratory Syncytial Virus NOT DETECTED NOT DETECTED Final   Bordetella pertussis NOT DETECTED NOT DETECTED Final   Bordetella Parapertussis NOT DETECTED NOT DETECTED Final   Chlamydophila pneumoniae NOT DETECTED NOT DETECTED Final   Mycoplasma pneumoniae NOT DETECTED NOT DETECTED Final    Comment: Performed at [facility], [address] [place], [place] [address]  Culture, group A strep     Status: None   Collection Time: 02/17/21 11:24 AM   Specimen: Throat  Result Value Ref Range Status   Specimen Description THROAT  Final   Special Requests Normal  Final   Culture   Final    NO GROUP A STREP (S.PYOGENES) ISOLATED Performed at [facility], [address], [place], [place] [address]    Report Status 02/19/2021 FINAL  Final    Radiology Reports CT ABDOMEN PELVIS WO CONTRAST  Result Date: 02/16/2021 CLINICAL DATA:  Abdominal pain. EXAM: CT ABDOMEN AND PELVIS WITHOUT CONTRAST TECHNIQUE: Multidetector CT imaging of the abdomen and pelvis was performed following the standard protocol without IV contrast. COMPARISON:  None. FINDINGS: Lower chest: No acute abnormality. Hepatobiliary: No focal liver abnormality is seen. No gallstones, gallbladder wall thickening, or biliary dilatation. Pancreas: Unremarkable. No pancreatic ductal dilatation or surrounding inflammatory changes. Spleen: Normal in size without focal abnormality. Adrenals/Urinary Tract: Adrenal glands appear normal. No hydronephrosis or renal obstruction is noted. No renal or ureteral calculi are noted. Urinary bladder is decompressed secondary to Foley catheter. Minimal inflammatory changes are noted around the right kidney  suggesting possible pyelonephritis. Stomach/Bowel: Stomach is within normal limits. Appendix appears normal. No evidence of bowel wall thickening, distention, or inflammatory changes. Vascular/Lymphatic: No significant vascular findings are present. No enlarged abdominal or pelvic lymph nodes. Reproductive: No adnexal abnormality is noted. Probable small exophytic uterine fibroid is noted arising posteriorly from the uterus. Other: No abdominal wall hernia or abnormality. No abdominopelvic ascites. Musculoskeletal: No acute or significant osseous findings. IMPRESSION: 1. Minimal inflammatory changes are noted around the right kidney suggesting possible pyelonephritis. 2. No hydronephrosis or renal obstruction is noted. No renal or ureteral calculi are noted. 3. Probable small exophytic uterine fibroid is noted arising posteriorly from the uterus. Electronically Signed   By: Lupita Raider M.D.   On: 02/16/2021 18:32   CT Angio Head W or Wo Contrast  Result Date: 01/27/2021 CLINICAL DATA:  Diplopia.  Dural sinus thrombosis suspected. EXAM: CT ANGIOGRAPHY HEAD CT VENOGRAM HEAD TECHNIQUE: Multidetector CT imaging of the head was performed using the standard protocol during bolus administration of intravenous contrast. Multiplanar CT image reconstructions and MIPs were obtained to evaluate the arterial anatomy. Additional delayed multidetector CT imaging of the head was performed with multiplanar CT image reconstructions and MIPS to evaluate the venous anatomy. CONTRAST:  OMNIPAQUE IOHEXOL 350 MG/ML SOLN COMPARISON:  None. FINDINGS: CT HEAD Brain: No evidence of acute large vascular territory infarction, acute hemorrhage, hydrocephalus, extra-axial collection or mass lesion/mass effect. Mild narrowing of the mamillopontine distance and effacement of the suprasellar cistern with trace tonsillar ectopia. Skull: No acute fracture. Sinuses: Visualized sinuses are clear. Orbits: No acute finding. CTA HEAD Due to  venous timing, significantly limited evaluation. The intradural left vertebral artery is poorly  opacified, which could relate to stenosis or non dominant vertebral artery. Otherwise, no evidence of proximal large vessel occlusion or large aneurysm in the anterior or posterior circulation. CT VENOGRAM No evidence of dural sinus thrombosis. Small left transverse sinus and narrowing of the distal right transverse sinus. Small rounded filling defect in the distal right transverse sinus likely represents arachnoid granulation. IMPRESSION: CT head: 1. No definite evidence of acute intracranial abnormality. 2. Mild narrowing of the mamillopontine distance and effacement of the suprasellar cistern with trace tonsillar ectopia. Given the patient is young and has relative paucity of CSF spaces/sulci diffusely these findings may be within normal limits, but recommend correlation with signs/symptoms of intracranial hypotension including positional headaches. Additionally, per Dr. Rush Landmark the patient is going to get a follow-up MRI which can further evaluate these findings. CTA head: Due to venous timing, significantly limited evaluation. The intradural left vertebral artery is not well opacified, which is poorly evaluated and could relate to nondominant vertebral artery or stenosis. A CTA neck could further evaluate if clinically indicated. Otherwise, no evidence of proximal large vessel occlusion or large aneurysm. A CTA of the neck could further evaluate the vertebral finding and a repeat CTA head could provide more sensitive intracranial arterial evaluation if clinically indicated. CTV: No evidence of dural sinus thrombosis. Findings discussed with Dr. Julieanne Manson at 3:05 PM via telephone. Electronically Signed   By: Feliberto Harts MD   On: 01/27/2021 15:15   DG Chest 2 View  Result Date: 02/16/2021 CLINICAL DATA:  Shortness of breath. EXAM: CHEST - 2 VIEW COMPARISON:  January 27, 2021. FINDINGS: The heart size and  mediastinal contours are within normal limits. Both lungs are clear. No pneumothorax or pleural effusion is noted. The visualized skeletal structures are unremarkable. IMPRESSION: No active cardiopulmonary disease. Electronically Signed   By: Lupita Raider M.D.   On: 02/16/2021 12:53   US RENAL  Result Date: 02/21/2021 CLINICAL DATA:  Acute renal insufficiency EXAM: RENAL / URINARY TRACT ULTRASOUND COMPLETE COMPARISON:  02/20/2021 right upper quadrant ultrasound. Renal ultrasound 02/16/2021. FINDINGS: Right Kidney: Renal measurements: 12.3 by 6.0 x 5.5 cm = volume: 213 mL. Increased renal echogenicity. No hydronephrosis. Left Kidney: Renal measurements: 10.0 x 5.3 x 4.4 cm = volume: 128 mL. Increased renal echogenicity. No hydronephrosis. Bladder: Collapsed around a Foley catheter. Other: None. IMPRESSION: No hydronephrosis. Increased renal echogenicity, suggesting medical renal disease. Electronically Signed   By: Jeronimo Greaves M.D.   On: 02/21/2021 21:36   US Renal  Result Date: 02/16/2021 CLINICAL DATA:  Renal failure EXAM: RENAL / URINARY TRACT ULTRASOUND COMPLETE COMPARISON:  CT 02/16/2021 FINDINGS: Right Kidney: Renal measurements: 11.9 x 5.8 x 4.4 cm = volume: 159.2 mL. Cortex is echogenic. No mass or hydronephrosis. Trace perinephric fluid Left Kidney: Renal measurements: 9.7 x 5.2 x 4.9 cm = volume: 129.5 mL. Cortex is echogenic. No mass or hydronephrosis. Bladder: Empty by Foley catheter. Other: None. IMPRESSION: Echogenic kidneys bilaterally consistent with medical renal disease. No hydronephrosis Electronically Signed   By: Jasmine Pang M.D.   On: 02/16/2021 20:23   IR Fluoro Guide CV Line Right  Result Date: 02/18/2021 INDICATION: 55 year old female referred for temporary hemodialysis catheter placement EXAM: IMAGE GUIDED PLACEMENT OF TEMPORARY HEMODIALYSIS CATHETER MEDICATIONS: None ANESTHESIA/SEDATION: Moderate (conscious) sedation was employed during this procedure. A total of Versed  1.0 mg and Fentanyl 50 mcg was administered intravenously. Moderate Sedation Time: 13 minutes. The patient's level of consciousness and vital signs were monitored continuously by radiology nursing  throughout the procedure under my direct supervision. FLUOROSCOPY TIME:  Fluoroscopy Time: 0 minutes 6 seconds (1 mGy). COMPLICATIONS: None PROCEDURE: Informed written consent was obtained from the patient and the patient's family after a thorough discussion of the procedural risks, benefits and alternatives. All questions were addressed. A timeout was performed prior to the initiation of the procedure. The right neck and chest was prepped with chlorhexidine, and draped in the usual sterile fashion using maximum barrier technique (cap and mask, sterile gown, sterile gloves, large sterile sheet, hand hygiene and cutaneous antiseptic). Local anesthesia was attained by infiltration with 1% lidocaine without epinephrine. Ultrasound demonstrated patency of the right internal jugular vein, and this was documented with an image. Under real-time ultrasound guidance, this vein was accessed with a 21 gauge micropuncture needle and image documentation was performed. A small dermatotomy was made at the access site with an 11 scalpel. A 0.018" wire was advanced into the SVC and the access needle exchanged for a 11F micropuncture vascular sheath. The 0.018" wire was then removed and a 0.035" wire advanced into the IVC. Upon withdrawal of the 018 wire, the wire was marked for appropriate length of the internal portion of the catheter. A 16 cm catheter was selected. Skin and subcutaneous tissues were serially dilated. Catheter was placed on the wire. The catheter tip is positioned in the upper right atrium. This was documented with a spot image. Both ports of the hemodialysis catheter were then tested for excellent function. The ports were then locked with heparinized lock. Patient tolerated the procedure well and remained hemodynamically  stable throughout. No complications were encountered and no significant blood loss was encountered. IMPRESSION: Status post image guided placement of temporary hemodialysis catheter. Catheter may be converted if needed. Signed, Yvone Neu. Reyne Dumas, RPVI [facility] Electronically Signed   By: Gilmer Mor D.O.   On: 02/18/2021 14:21   IR US Guide Vasc Access Right  Result Date: 02/18/2021 INDICATION: 55 year old female referred for temporary hemodialysis catheter placement EXAM: IMAGE GUIDED PLACEMENT OF TEMPORARY HEMODIALYSIS CATHETER MEDICATIONS: None ANESTHESIA/SEDATION: Moderate (conscious) sedation was employed during this procedure. A total of Versed 1.0 mg and Fentanyl 50 mcg was administered intravenously. Moderate Sedation Time: 13 minutes. The patient's level of consciousness and vital signs were monitored continuously by radiology nursing throughout the procedure under my direct supervision. FLUOROSCOPY TIME:  Fluoroscopy Time: 0 minutes 6 seconds (1 mGy). COMPLICATIONS: None PROCEDURE: Informed written consent was obtained from the patient and the patient's family after a thorough discussion of the procedural risks, benefits and alternatives. All questions were addressed. A timeout was performed prior to the initiation of the procedure. The right neck and chest was prepped with chlorhexidine, and draped in the usual sterile fashion using maximum barrier technique (cap and mask, sterile gown, sterile gloves, large sterile sheet, hand hygiene and cutaneous antiseptic). Local anesthesia was attained by infiltration with 1% lidocaine without epinephrine. Ultrasound demonstrated patency of the right internal jugular vein, and this was documented with an image. Under real-time ultrasound guidance, this vein was accessed with a 21 gauge micropuncture needle and image documentation was performed. A small dermatotomy was made at the access site with  an 11 scalpel. A 0.018" wire was advanced into the SVC and the access needle exchanged for a 11F micropuncture vascular sheath. The 0.018" wire was then removed and a 0.035" wire advanced into the IVC. Upon withdrawal of the 018 wire, the wire was marked for appropriate length of  the internal portion of the catheter. A 16 cm catheter was selected. Skin and subcutaneous tissues were serially dilated. Catheter was placed on the wire. The catheter tip is positioned in the upper right atrium. This was documented with a spot image. Both ports of the hemodialysis catheter were then tested for excellent function. The ports were then locked with heparinized lock. Patient tolerated the procedure well and remained hemodynamically stable throughout. No complications were encountered and no significant blood loss was encountered. IMPRESSION: Status post image guided placement of temporary hemodialysis catheter. Catheter may be converted if needed. Signed, Yvone Neu. Reyne Dumas, RPVI [facility] Radiology Electronically Signed   By: Gilmer Mor D.O.   On: 02/18/2021 14:21   DG Chest Portable 1 View  Result Date: 01/27/2021 CLINICAL DATA:  Persistent cough. Recurrent upper respiratory infection EXAM: PORTABLE CHEST 1 VIEW COMPARISON:  Radiograph 06/27/2016 FINDINGS: 1355 hours. The heart size and mediastinal contours are normal. The lungs are clear. There is no pleural effusion or pneumothorax. No acute osseous findings are identified. Telemetry leads overlie the chest. IMPRESSION: No active cardiopulmonary process. Electronically Signed   By: Carey Bullocks M.D.   On: 01/27/2021 14:47   ECHOCARDIOGRAM COMPLETE  Result Date: 02/17/2021    ECHOCARDIOGRAM REPORT   Patient Name:   DELAYNA SPARLIN Date of Exam: 02/17/2021 Medical Rec #:  [id number]       Height:       65.0 in Accession #:    [id number]      Weight:       240.0 lb Date of Birth:  [birth year]        BSA:           2.138 m Patient Age:    54 years        BP:           120/60 mmHg Patient Gender: F               HR:           67 bpm. Exam Location:  Inpatient Procedure: 2D Echo Indications:    CHF-Acute Diastolic I50.31  History:        Patient has no prior history of Echocardiogram examinations.  Sonographer:    Thurman Coyer RDCS (AE) Referring Phys: [id number] [facility] IMPRESSIONS  1. Left ventricular ejection fraction, by estimation, is 60%. The left ventricle has normal function. The left ventricle has no regional wall motion abnormalities. There is mild left ventricular hypertrophy. Left ventricular diastolic parameters were low normal.  2. Right ventricular systolic function is normal. The right ventricular size is normal. Tricuspid regurgitation signal is inadequate for assessing PA pressure.  3. The mitral valve is normal in structure. Trivial mitral valve regurgitation. No evidence of mitral stenosis.  4. The aortic valve is tricuspid. Aortic valve regurgitation is not visualized. No aortic stenosis is present.  5. The inferior vena cava is normal in size with greater than 50% respiratory variability, suggesting right atrial pressure of 3 mmHg. FINDINGS  Left Ventricle: Left ventricular ejection fraction, by estimation, is 60%. The left ventricle has normal function. The left ventricle has no regional wall motion abnormalities. The left ventricular internal cavity size was normal in size. There is mild left ventricular hypertrophy. Left ventricular diastolic parameters were normal. Right Ventricle: The right ventricular size is normal. No increase in right ventricular wall thickness. Right ventricular systolic function is normal. Tricuspid regurgitation signal is inadequate for assessing PA pressure.  Left Atrium: Left atrial size was normal in size. Right Atrium: Right atrial size was normal in size. Pericardium: There is no evidence of pericardial effusion. Mitral Valve: The mitral valve is normal in  structure. Trivial mitral valve regurgitation. No evidence of mitral valve stenosis. Tricuspid Valve: The tricuspid valve is normal in structure. Tricuspid valve regurgitation is trivial. No evidence of tricuspid stenosis. Aortic Valve: The aortic valve is tricuspid. Aortic valve regurgitation is not visualized. No aortic stenosis is present. Pulmonic Valve: The pulmonic valve was normal in structure. Pulmonic valve regurgitation is trivial. No evidence of pulmonic stenosis. Aorta: The aortic root is normal in size and structure. Venous: The inferior vena cava is normal in size with greater than 50% respiratory variability, suggesting right atrial pressure of 3 mmHg. IAS/Shunts: The interatrial septum was not well visualized.  LEFT VENTRICLE PLAX 2D LVIDd:         4.10 cm  Diastology LVIDs:         2.80 cm  LV e' medial:    6.53 cm/s LV PW:         1.20 cm  LV E/e' medial:  11.0 LV IVS:        1.20 cm  LV e' lateral:   9.57 cm/s LVOT diam:     2.10 cm  LV E/e' lateral: 7.5 LV SV:         76 LV SV Index:   36 LVOT Area:     3.46 cm  RIGHT VENTRICLE RV S prime:     15.40 cm/s TAPSE (M-mode): 1.4 cm LEFT ATRIUM             Index       RIGHT ATRIUM          Index LA diam:        3.10 cm 1.45 cm/m  RA Area:     9.08 cm LA Vol (A2C):   27.6 ml 12.91 ml/m RA Volume:   15.80 ml 7.39 ml/m LA Vol (A4C):   39.6 ml 18.53 ml/m LA Biplane Vol: 35.3 ml 16.51 ml/m  AORTIC VALVE LVOT Vmax:   107.00 cm/s LVOT Vmean:  64.800 cm/s LVOT VTI:    0.220 m  AORTA Ao Root diam: 2.80 cm MITRAL VALVE MV Area (PHT): 2.42 cm    SHUNTS MV Decel Time: 313 msec    Systemic VTI:  0.22 m MV E velocity: 71.60 cm/s  Systemic Diam: 2.10 cm MV A velocity: 73.30 cm/s MV E/A ratio:  0.98 Weston Brass MD Electronically signed by Weston Brass MD Signature Date/Time: 02/17/2021/1:13:33 PM    Final    US BIOPSY (KIDNEY)  Result Date: 02/20/2021 INDICATION: 70 year old with acute kidney injury and request for renal biopsy. EXAM:  ULTRASOUND-GUIDED RANDOM RENAL BIOPSY MEDICATIONS: Moderate sedation none. ANESTHESIA/SEDATION: Moderate (conscious) sedation was employed during this procedure. A total of Versed 1.0 mg and Fentanyl 50 mcg was administered intravenously. Moderate Sedation Time: 15 minutes. The patient's level of consciousness and vital signs were monitored continuously by radiology nursing throughout the procedure under my direct supervision. FLUOROSCOPY TIME:  None COMPLICATIONS: None immediate. PROCEDURE: Informed written consent was obtained from the patient after a thorough discussion of the procedural risks, benefits and alternatives. All questions were addressed. A timeout was performed prior to the initiation of the procedure. Patient was placed prone. Both kidneys were evaluated with ultrasound. The right kidney was targeted for biopsy. The right flank was prepped with chlorhexidine and sterile field was created. Maximal barrier  sterile technique was utilized including caps, mask, sterile gowns, sterile gloves, sterile drape, hand hygiene and skin antiseptic. Skin was anesthetized using 1% lidocaine. A small incision was made. Using ultrasound guidance, 16 gauge core biopsy was directed into the right kidney lower pole. Adequate specimen was obtained and placed in saline. A second ultrasound-guided core biopsy was obtained from the right kidney lower pole. Adequate specimen was obtained and placed in saline. Bandage placed over the puncture site. FINDINGS: Negative for hydronephrosis. Two core biopsies obtained from the right kidney lower pole. No significant bleeding or hematoma formation. IMPRESSION: Successful ultrasound-guided random core biopsies from the right kidney lower pole. Electronically Signed   By: Richarda OverlieAdam  Henn M.D.   On: 02/20/2021 17:20   CT VENOGRAM HEAD  Result Date: 01/27/2021 CLINICAL DATA:  Diplopia.  Dural sinus thrombosis suspected. EXAM: CT ANGIOGRAPHY HEAD CT VENOGRAM HEAD TECHNIQUE: Multidetector  CT imaging of the head was performed using the standard protocol during bolus administration of intravenous contrast. Multiplanar CT image reconstructions and MIPs were obtained to evaluate the arterial anatomy. Additional delayed multidetector CT imaging of the head was performed with multiplanar CT image reconstructions and MIPS to evaluate the venous anatomy. CONTRAST:  100mL OMNIPAQUE IOHEXOL 350 MG/ML SOLN COMPARISON:  None. FINDINGS: CT HEAD Brain: No evidence of acute large vascular territory infarction, acute hemorrhage, hydrocephalus, extra-axial collection or mass lesion/mass effect. Mild narrowing of the mamillopontine distance and effacement of the suprasellar cistern with trace tonsillar ectopia. Skull: No acute fracture. Sinuses: Visualized sinuses are clear. Orbits: No acute finding. CTA HEAD Due to venous timing, significantly limited evaluation. The intradural left vertebral artery is poorly opacified, which could relate to stenosis or non dominant vertebral artery. Otherwise, no evidence of proximal large vessel occlusion or large aneurysm in the anterior or posterior circulation. CT VENOGRAM No evidence of dural sinus thrombosis. Small left transverse sinus and narrowing of the distal right transverse sinus. Small rounded filling defect in the distal right transverse sinus likely represents arachnoid granulation. IMPRESSION: CT head: 1. No definite evidence of acute intracranial abnormality. 2. Mild narrowing of the mamillopontine distance and effacement of the suprasellar cistern with trace tonsillar ectopia. Given the patient is young and has relative paucity of CSF spaces/sulci diffusely these findings may be within normal limits, but recommend correlation with signs/symptoms of intracranial hypotension including positional headaches. Additionally, per Dr. Rush Landmarkegeler the patient is going to get a follow-up MRI which can further evaluate these findings. CTA head: Due to venous timing, significantly  limited evaluation. The intradural left vertebral artery is not well opacified, which is poorly evaluated and could relate to nondominant vertebral artery or stenosis. A CTA neck could further evaluate if clinically indicated. Otherwise, no evidence of proximal large vessel occlusion or large aneurysm. A CTA of the neck could further evaluate the vertebral finding and a repeat CTA head could provide more sensitive intracranial arterial evaluation if clinically indicated. CTV: No evidence of dural sinus thrombosis. Findings discussed with Dr. Julieanne Mansonegler at 3:05 PM via telephone. Electronically Signed   By: Feliberto HartsFrederick S Jones MD   On: 01/27/2021 15:15   US Abdomen Limited RUQ (LIVER/GB)  Result Date: 02/20/2021 CLINICAL DATA:  Transaminitis EXAM: ULTRASOUND ABDOMEN LIMITED RIGHT UPPER QUADRANT COMPARISON:  CT abdomen and pelvis 02/16/2021 FINDINGS: Gallbladder: Normally distended. 6 mm nonshadowing intraluminal echogenic focus likely small polyp; no follow-up imaging recommended. No gallbladder wall thickening, pericholecystic fluid or sonographic Murphy sign. No discrete shadowing calculi. Common bile duct: Diameter: 2 mm Liver: Slightly heterogeneous  echogenicity, nonspecific. No discrete hepatic mass or nodularity. No intrahepatic biliary dilatation. Portal vein is patent on color Doppler imaging with normal direction of blood flow towards the liver. Other: No RIGHT upper quadrant free fluid. IMPRESSION: 6 mm gallbladder polyp; no follow-up imaging recommended. Nonspecific mildly heterogeneous hepatic parenchymal echogenicity without mass or biliary dilatation. Electronically Signed   By: Ulyses Southward M.D.   On: 02/20/2021 18:13

## 2021-02-23 NOTE — Progress Notes (Signed)
Occupational Therapy Treatment Patient Details Name: Denise Watson MRN: [id number] DOB: 1966/11/21 Today's Date: 02/23/2021    History of present illness Pt is a 55 y.o. female admitted 02/16/21 with fatigue, SOB. 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 S/p RIJ temp HD cath placement 3/19. Plan for renal biopsy 3/21. PMH includes HTN, obesity, psoriasis, COVID-19 (12/2020).   OT comments  OT treatment session with focus on self-care re-education, OOB activity tolerance and functional mobility. Patient completes bed mobility with Mod I, and completes functional mobility and toilet transfer to low commode in bathroom with supervision A. Encouraged patient to sit in recliner at conclusion of session. With increased coaxing/encouragment, patient in agreement. Patient with desire to have BM but unable to this date. Patient reports receiving stool softener this a.m. Patient continues to be limited by deficits listed below 2/2 diagnosis above and would benefit from continued acute OT services in prep for safe d/c home.    Follow Up Recommendations  Home health OT    Equipment Recommendations  3 in 1 bedside commode    Recommendations for Other Services      Precautions / Restrictions Precautions Precautions: Fall Precaution Comments: Quick to fatigue Restrictions Weight Bearing Restrictions: No       Mobility Bed Mobility Overal bed mobility: Modified Independent                  Transfers Overall transfer level: Needs assistance Equipment used: Standard walker Transfers: Sit to/from Stand Sit to Stand: Supervision         General transfer comment: Cues for hand placement and increased time.    Balance Overall balance assessment: Needs assistance Sitting-balance support: Feet supported;No upper extremity supported Sitting balance-Leahy Scale: Good     Standing balance support: Bilateral upper extremity supported Standing balance-Leahy  Scale: Poor Standing balance comment: Reliant on BUE on RW.                           ADL either performed or assessed with clinical judgement   ADL Overall ADL's : Needs assistance/impaired       Grooming Details (indicate cue type and reason): Patient declines handwashing standing at sink level stating "I'm too weak to do that".                 Toilet Transfer: Supervision/safety (Standard walker) Statistician Details (indicate cue type and reason): Patient ambluated to commode in bathroom with standard walker and Min guard progressing to supervision A. Cues for walker management. Toileting- Clothing Manipulation and Hygiene: Supervision/safety;Sit to/from stand Toileting - Clothing Manipulation Details (indicate cue type and reason): Supervision A for safety. Patient unable to have BM. Supervision A for sit to stand from standard commode in bathroom.     Functional mobility during ADLs: Supervision/safety (Standard walker) General ADL Comments: Patient is self limiting. Able to complete functional tasks with supervision A to Min guard grossly.     Vision       Perception     Praxis      Cognition Arousal/Alertness: Lethargic Behavior During Therapy: Flat affect Overall Cognitive Status: Within Functional Limits for tasks assessed                                 General Comments: Responds appropriately to therapists questions. Appears to require increased time to process verbal information.  Exercises     Shoulder Instructions       General Comments Sister present at bedside. Patient requires increased coaxing/encouragment for participation with ADLs.    Pertinent Vitals/ Pain       Pain Assessment: Faces Faces Pain Scale: Hurts a little bit Pain Location: new RIJ HD temp cath site Pain Descriptors / Indicators: Discomfort Pain Intervention(s): Monitored during session  Home Living                                           Prior Functioning/Environment              Frequency  Min 2X/week        Progress Toward Goals  OT Goals(current goals can now be found in the care plan section)  Progress towards OT goals: Progressing toward goals (Slowly)  Acute Rehab OT Goals Patient Stated Goal: Does not state. OT Goal Formulation: With patient Time For Goal Achievement: 03/05/21 Potential to Achieve Goals: Good ADL Goals Pt Will Perform Grooming: with supervision;standing Pt Will Perform Lower Body Bathing: with supervision;sit to/from stand;with adaptive equipment Pt Will Perform Lower Body Dressing: with supervision;with adaptive equipment;sit to/from stand Pt Will Transfer to Toilet: with supervision;ambulating;bedside commode Pt Will Perform Toileting - Clothing Manipulation and hygiene: with supervision;sit to/from stand Additional ADL Goal #1: Pt will state at least 3 energy conservation strategies as instructed.  Plan Discharge plan remains appropriate;Frequency remains appropriate    Co-evaluation                 AM-PAC OT "6 Clicks" Daily Activity     Outcome Measure   Help from another person eating meals?: None Help from another person taking care of personal grooming?: A Little Help from another person toileting, which includes using toliet, bedpan, or urinal?: A Little Help from another person bathing (including washing, rinsing, drying)?: A Lot Help from another person to put on and taking off regular upper body clothing?: A Little Help from another person to put on and taking off regular lower body clothing?: A Lot 6 Click Score: 17    End of Session Equipment Utilized During Treatment: Gait belt;Other (comment) (Standard walker)  OT Visit Diagnosis: Unsteadiness on feet (R26.81);Other abnormalities of gait and mobility (R26.89);Muscle weakness (generalized) (M62.81);Other (comment)   Activity Tolerance Other (comment) (Patient is self-limiting)    Patient Left in chair;with call bell/phone within reach;with family/visitor present   Nurse Communication          Time: [phone] OT Time Calculation (min): 20 min  Charges: OT General Charges $OT Visit: 1 Visit OT Treatments $Self Care/Home Management : 8-22 mins  Sheran Newstrom H. OTR/L Supplemental OT, Department of rehab services [phone]   Jeorge Reister R H. 02/23/2021, 1:01 PM

## 2021-02-23 NOTE — Progress Notes (Signed)
Physical Therapy Treatment Patient Details Name: Denise Watson MRN: [id number] DOB: [birth year] Today's Date: 02/23/2021    History of Present Illness Pt is a 55 y.o. female admitted 02/16/21 with fatigue, SOB. 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 S/p RIJ temp HD cath placement 3/19. Plan for renal biopsy 3/21. PMH includes HTN, obesity, psoriasis, COVID-19 (12/2020).    PT Comments    Pt initially flat and unresponsive to PTA's encouragement to participate with therapy. Encouraged pt about her current dx and situation, letting her know there are others who are going through the same situation and are able to live full happy lives with lupus. Pt finally agreed to ambulate to and from the restroom. After treatment transport arrived to take pt to HD. Will continue to follow acutely.     Follow Up Recommendations  Home health PT;Supervision for mobility/OOB     Equipment Recommendations  Rolling walker with 5" wheels;Other (comment) (shower seat with rails (not 3in1))    Recommendations for Other Services       Precautions / Restrictions Precautions Precautions: Fall Precaution Comments: Quick to fatigue Restrictions Weight Bearing Restrictions: No    Mobility  Bed Mobility Overal bed mobility: Modified Independent             General bed mobility comments: received in chair    Transfers Overall transfer level: Needs assistance Equipment used: Standard walker Transfers: Sit to/from Stand Sit to Stand: Supervision         General transfer comment: Cues for hand placement and increased time.  Ambulation/Gait Ambulation/Gait assistance: Min guard Gait Distance (Feet): 15 Feet (x2) Assistive device: Standard walker Gait Pattern/deviations: Step-to pattern;Trunk flexed;Decreased stride length Gait velocity: Decreased Gait velocity interpretation: <1.31 ft/sec, indicative of household ambulator General Gait Details: able to ambulate with  standard walker to and from restroom. No overt LOB. Cues for walker proximity   Stairs             Wheelchair Mobility    Modified Rankin (Stroke Patients Only)       Balance Overall balance assessment: Needs assistance Sitting-balance support: Feet supported;No upper extremity supported Sitting balance-Leahy Scale: Good     Standing balance support: Bilateral upper extremity supported Standing balance-Leahy Scale: Poor Standing balance comment: Reliant on BUE on RW.                            Cognition Arousal/Alertness: Awake/alert Behavior During Therapy: Flat affect Overall Cognitive Status: Within Functional Limits for tasks assessed                                 General Comments: Very flat and slow to respond. Likely due to depression rather than any cognitive issue.      Exercises      General Comments General comments (skin integrity, edema, etc.): Requires encouragement. Advised to check out lupus resources online and connect with other pts with similer dx.      Pertinent Vitals/Pain Pain Assessment: Faces Faces Pain Scale: No hurt Pain Location: new RIJ HD temp cath site Pain Descriptors / Indicators: Discomfort Pain Intervention(s): Monitored during session    Home Living                      Prior Function            PT Goals (current goals can  now be found in the care plan section) Acute Rehab PT Goals Patient Stated Goal: Does not state. PT Goal Formulation: With patient Time For Goal Achievement: 03/04/21 Potential to Achieve Goals: Good Progress towards PT goals: Progressing toward goals    Frequency    Min 3X/week      PT Plan Current plan remains appropriate    Co-evaluation              AM-PAC PT "6 Clicks" Mobility   Outcome Measure  Help needed turning from your back to your side while in a flat bed without using bedrails?: A Little Help needed moving from lying on your  back to sitting on the side of a flat bed without using bedrails?: A Little Help needed moving to and from a bed to a chair (including a wheelchair)?: A Little Help needed standing up from a chair using your arms (e.g., wheelchair or bedside chair)?: A Little Help needed to walk in hospital room?: A Little Help needed climbing 3-5 steps with a railing? : A Lot 6 Click Score: 17    End of Session Equipment Utilized During Treatment: Gait belt Activity Tolerance: Patient tolerated treatment well Patient left: with call bell/phone within reach;in bed;Other (comment) (transport present to take pt to HD) Nurse Communication: Mobility status PT Visit Diagnosis: Other abnormalities of gait and mobility (R26.89);Difficulty in walking, not elsewhere classified (R26.2)     Time: [id number] PT Time Calculation (min) (ACUTE ONLY): 30 min  Charges:  $Gait Training: 8-22 mins $Therapeutic Activity: 8-22 mins                     Kallie Locks, [place] Pager [phone] Acute Rehab   Sheral Apley 02/23/2021, 2:51 PM

## 2021-02-23 NOTE — Progress Notes (Signed)
[facility] NEPHROLOGY PROGRESS NOTE  Assessment/ Plan: Pt is a 55 y.o. yo female with history of psoriasis, COVID-19 infection, recent diagnosis of HTN restarted HCTZ presented with generalized body pain, shortness of breath and found to have AKI.  Acute kidney injury with nephrotic syndrome: The acute elevation of serum creatinine level to 10 from normal creatinine about a month ago. The urine study with proteinuria around 6g, serum albumin 1.5 and trace edema. No microscopic hematuria or WBC in the first urinalysis.   The repeat urinalysis was after placement of Foley catheter.  Kidney ultrasound without hydronephrosis. The lab result came back with elevated antidsDNA >300 with low C3 and C4 and high ANA titers. Hep B, C, HIV, ANCA, ASO negative. 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  Full has staining for allergies and complements as well as tubular basement membrane deposits.  Her GN has low activity and low chronicity. Intense full house staining. Her acute tubular injury is likely related to collapsing glomerulopathy. -We started solumedrol 250 mg IV on 3/19 for 3 days and now on prednisone 60 mg as of 3/22, plan to taper in 2 weeks, plan to taper to 40mg  on 4/5. Further IS as below -Started dialysis on 3/19 for oliguric AKI and azotemia. RIJ temp HD catheter placed by IR. -IR consult for [facility] placement, clip for outpatient placement -stopping fleet enemas  Lupus Nephritis Class 3 -full house staining, low chronicity/low activity -Continue with prednisone 60 mg, plan to taper to 40 mg on 4/5 -We will start CellCept, low-dose.  Side effect profile reviewed with patient.  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  Overall, this is not unexpected finding  on her biopsy.  I do wonder if there is genetic component to this or if this is related to her Covid infection back in January.  Nonetheless this is an overall poor kidney prognostic sign.  She is on steroids annually which will continue also for this.  Final biopsy report pending -24 hr urine for proteinuria ordered  Hypertension: Blood pressure acceptable.  UF as tolerated  Exertional shortness of breath: Chest x-ray negative.    Echo with EF of 60%.  UF with HD.  Sore throat: Reportedly she was treated with penicillin by her PCP.    ASO titer not elevated.  Transaminitis: work up per primary  Hyponatremia: managing with HD, UF as tolerated  Subjective: Seen and examined at bedside.  Discussed biopsy results at length and game plan in regards to dialysis and immunosuppression. Due for HD today, no complaints.  Objective Vital signs in last 24 hours: Vitals:   02/21/21 2032 02/22/21 1453 02/22/21 2055 02/23/21 0456  BP: 117/60 139/71 (!) 155/92 (!) 152/88  Pulse:  64  68  Resp: 16 15 20 15   Temp: 98.3 F (36.8 C) 98 F (36.7 C) 98.3 F (36.8 C) 98.3 F (36.8 C)  TempSrc: Axillary Axillary Axillary Axillary  SpO2: 96% 97% 98% 98%  Weight:      Height:       Weight change:   Intake/Output Summary (Last 24 hours) at 02/23/2021 1220 Last data filed at 02/23/2021 0500 Gross per 24 hour  Intake 20 ml  Output 75 ml  Net -55 ml       Labs: Basic Metabolic Panel: Recent Labs  Lab 02/18/21 1420 02/19/21  0206 02/20/21 0310 02/21/21 1137 02/23/21 0357  NA 132*   < > 130* 131* 130*  K 4.8   < > 4.5 4.8 5.0  CL 98   < > 97* 97* 96*  CO2 18*   < > 22 20* 23  GLUCOSE 103*   < > 152* 144* 171*  BUN 119*   < > 71* 98* 73*  CREATININE 12.28*   < > 9.47* 11.78* 10.72*  CALCIUM 7.2*   < > 7.7* 7.9* 7.6*  PHOS 9.0*  --   --  8.6*  --    < > = values in this interval not displayed.   Liver Function Tests: Recent Labs  Lab 02/20/21 0310 02/21/21 1137 02/22/21 0601  02/23/21 0357  AST 163*  --  158* 106*  ALT 81*  --  116* 94*  ALKPHOS 78  --  81 75  BILITOT 0.6  --  0.7 0.6  PROT 5.3*  --  5.9* 5.7*  ALBUMIN 1.6* 1.8* 2.0* 1.9*   No results for input(s): LIPASE, AMYLASE in the last 168 hours. No results for input(s): AMMONIA in the last 168 hours. CBC: Recent Labs  Lab 02/18/21 0230 02/18/21 1420 02/19/21 0206 02/20/21 0310 02/21/21 1137  WBC 3.1* 3.3* 5.3 7.0 9.7  NEUTROABS 1.6*  --  4.1 5.2  --   HGB 10.8* 12.1 11.4* 10.8* 11.0*  HCT 32.2* 35.7* 32.9* 32.1* 33.8*  MCV 88.5 88.4 87.5 88.7 89.9  PLT 190 254 100* 93* 143*   Cardiac Enzymes: Recent Labs  Lab 02/17/21 0802  CKTOTAL 333*   CBG: No results for input(s): GLUCAP in the last 168 hours.  Iron Studies: No results for input(s): IRON, TIBC, TRANSFERRIN, FERRITIN in the last 72 hours. Studies/Results: US RENAL  Result Date: 02/21/2021 CLINICAL DATA:  Acute renal insufficiency EXAM: RENAL / URINARY TRACT ULTRASOUND COMPLETE COMPARISON:  02/20/2021 right upper quadrant ultrasound. Renal ultrasound 02/16/2021. FINDINGS: Right Kidney: Renal measurements: 12.3 by 6.0 x 5.5 cm = volume: 213 mL. Increased renal echogenicity. No hydronephrosis. Left Kidney: Renal measurements: 10.0 x 5.3 x 4.4 cm = volume: 128 mL. Increased renal echogenicity. No hydronephrosis. Bladder: Collapsed around a Foley catheter. Other: None. IMPRESSION: No hydronephrosis. Increased renal echogenicity, suggesting medical renal disease. Electronically Signed   By: Jeronimo Greaves M.D.   On: 02/21/2021 21:36    Medications: Infusions:   Scheduled Medications: . Chlorhexidine Gluconate Cloth  6 each Topical Q0600  . famotidine  10 mg Oral Daily  . heparin injection (subcutaneous)  5,000 Units Subcutaneous Q8H  . polyethylene glycol  17 g Oral Daily  . predniSONE  60 mg Oral Q breakfast   I have reviewed scheduled and prn medications.  Physical Exam: General:NAD, comfortable Heart:RRR, s1s2 nl Lungs:  decreased breath sounds bibasilar, no increase work of breathing Abdomen:soft, Non-tender, non-distended Extremities: Trace LE edema b/l Dialysis Access: Right IJ temporary HD catheter site looks clean  Vikas Singh 02/23/2021,12:20 PM  LOS: 7 days

## 2021-02-23 NOTE — Progress Notes (Signed)
Renal Navigator received notification from Dr. Singh/Nephrologist that patient needs outpatient HD referral for AKI treatment. Navigator notes that she lives in [place] and has various options for outpatient HD. Navigator met with patient at HD bedside to discuss. Patient seemed groggy and was somewhat difficult to engage at this time. She states she needs more time to think about where she would like to be referred. Navigator provided some basic information regarding outpatient options, such as, in order to remain with [facility]/MDs who have been providing her care in the hospital, the closest clinic for her will be [facility] on [address] Carlynn Spry told her that another possible clinic, that appears to be about one minute closer to her home, but would be with a different doctor group, is [facility]. Navigator noted that she has private insurance and asked if this is through her employer. She said that it is and that she works for [employer]. She is unsure what she will do about work at this time, but thinks she will take a LOA until she can return to work. Navigator informed patient that there is only one clinic who offers a very small 3rd shift, [facility], on [address] in [place]. Patient replied, "I don't want to be too far from home." She stated that she is not ready to make a decision about this right now. Navigator offered to call her tomorrow to complete referral, as we don't want this to delay her discharge when she is ready to go. She agreed for Navigator to call her in the morning.   Alphonzo Cruise, [place] [facility]al Navigator [phone]

## 2021-02-23 NOTE — Progress Notes (Signed)
PT Cancellation Note  Patient Details Name: Denise Watson MRN: [id number] DOB: 1966/04/27   Cancelled Treatment:    Reason Eval/Treat Not Completed: Patient declined, no reason specified. Pt declining due to fatigue. Will check back as time allows.  Kallie Locks, PTA Pager [phone] Acute Rehab   Sheral Apley 02/23/2021, 11:14 AM

## 2021-02-23 NOTE — Progress Notes (Addendum)
Referring Physician(s): Singh,V  Supervising Physician: Irish Lack  Patient Status:  [facility] - [facility]  Chief Complaint:  Renal failure  Subjective: Pt known to IR service from temp HD cath placement on 02/18/21 and random right renal bx on 02/20/21. She is a 55 y.o. yo female with history of psoriasis, COVID-19 infection earlier this year, recent diagnosis of HTN (restarted HCTZ ) who presented with generalized body pain, shortness of breath and found to have AKI with nephrotic syndrome, lupus nephritis. Latest creatinine 10.72.Request now received from nephrology for [facility] placement. Pt currently denies fever,HA,CP,dyspnea, cough, abd/back pain,N/V or bleeding; she does appear fatigued.   Past Medical History:  Diagnosis Date  . Swelling of both lower extremities    Past Surgical History:  Procedure Laterality Date  . CESAREAN SECTION    . IR FLUORO GUIDE CV LINE RIGHT  02/18/2021  . IR US GUIDE VASC ACCESS RIGHT  02/18/2021      Allergies: Patient has no known allergies.  Medications: Prior to Admission medications   Medication Sig Start Date End Date Taking? Authorizing Provider  amoxicillin (AMOXIL) 875 MG tablet Take 875 mg by mouth 2 (two) times daily. 01/31/21  Yes [provider]  ascorbic acid (VITAMIN C) 500 MG tablet Take 500 mg by mouth daily.   Yes [provider]  Cholecalciferol 25 MCG (1000 UT) tablet Take 1,000 Units by mouth daily.   Yes [provider]  clotrimazole (LOTRIMIN) 1 % cream Apply 1 application topically 2 (two) times daily.   Yes [provider]  clotrimazole-betamethasone (LOTRISONE) cream Apply 1 application topically 2 (two) times daily.   Yes [provider]  hydrochlorothiazide (HYDRODIURIL) 25 MG tablet Take 25 mg by mouth daily.   Yes [provider]  vitamin B-12 (CYANOCOBALAMIN) 500 MCG tablet Take 500 mcg by mouth daily.   Yes [provider]     Vital Signs: BP (!)  152/88 (BP Location: Left Arm)   Pulse 68   Temp 98.3 F (36.8 C) (Axillary)   Resp 15   Ht 5\' 5"  (1.651 m)   Wt 296 lb 8.3 oz (134.5 kg)   SpO2 98%   BMI 49.34 kg/m   Physical Exam awake/alert; chest- CTA bilat; temp rt IJ HD cath intact; heart- RRR; abd- soft,+BS,NT; trace pretibial edema bilat  Imaging: RENAL  Result Date: 02/21/2021 CLINICAL DATA:  Acute renal insufficiency EXAM: RENAL / URINARY TRACT ULTRASOUND COMPLETE COMPARISON:  02/20/2021 right upper quadrant ultrasound. Renal ultrasound 02/16/2021. FINDINGS: Right Kidney: Renal measurements: 12.3 by 6.0 x 5.5 cm = volume: 213 mL. Increased renal echogenicity. No hydronephrosis. Left Kidney: Renal measurements: 10.0 x 5.3 x 4.4 cm = volume: 128 mL. Increased renal echogenicity. No hydronephrosis. Bladder: Collapsed around a Foley catheter. Other: None. IMPRESSION: No hydronephrosis. Increased renal echogenicity, suggesting medical renal disease. Electronically Signed   By: 02/18/2021 M.D.   On: 02/21/2021 21:36   02/23/2021 BIOPSY (KIDNEY)  Result Date: 02/20/2021 INDICATION: 70 year old with acute kidney injury and request for renal biopsy. EXAM: ULTRASOUND-GUIDED RANDOM RENAL BIOPSY MEDICATIONS: Moderate sedation none. ANESTHESIA/SEDATION: Moderate (conscious) sedation was employed during this procedure. A total of Versed 1.0 mg and Fentanyl 50 mcg was administered intravenously. Moderate Sedation Time: 15 minutes. The patient's level of consciousness and vital signs were monitored continuously by radiology nursing throughout the procedure under my direct supervision. FLUOROSCOPY TIME:  None COMPLICATIONS: None immediate. PROCEDURE: Informed written consent was obtained from the patient after a thorough discussion of  the procedural risks, benefits and alternatives. All questions were addressed. A timeout was performed prior to the initiation of the procedure. Patient was placed prone. Both kidneys were evaluated with ultrasound. The  right kidney was targeted for biopsy. The right flank was prepped with chlorhexidine and sterile field was created. Maximal barrier sterile technique was utilized including caps, mask, sterile gowns, sterile gloves, sterile drape, hand hygiene and skin antiseptic. Skin was anesthetized using 1% lidocaine. A small incision was made. Using ultrasound guidance, 16 gauge core biopsy was directed into the right kidney lower pole. Adequate specimen was obtained and placed in saline. A second ultrasound-guided core biopsy was obtained from the right kidney lower pole. Adequate specimen was obtained and placed in saline. Bandage placed over the puncture site. FINDINGS: Negative for hydronephrosis. Two core biopsies obtained from the right kidney lower pole. No significant bleeding or hematoma formation. IMPRESSION: Successful ultrasound-guided random core biopsies from the right kidney lower pole. Electronically Signed   By: Richarda Overlie M.D.   On: 02/20/2021 17:20   US Abdomen Limited RUQ (LIVER/GB)  Result Date: 02/20/2021 CLINICAL DATA:  Transaminitis EXAM: ULTRASOUND ABDOMEN LIMITED RIGHT UPPER QUADRANT COMPARISON:  CT abdomen and pelvis 02/16/2021 FINDINGS: Gallbladder: Normally distended. 6 mm nonshadowing intraluminal echogenic focus likely small polyp; no follow-up imaging recommended. No gallbladder wall thickening, pericholecystic fluid or sonographic Murphy sign. No discrete shadowing calculi. Common bile duct: Diameter: 2 mm Liver: Slightly heterogeneous echogenicity, nonspecific. No discrete hepatic mass or nodularity. No intrahepatic biliary dilatation. Portal vein is patent on color Doppler imaging with normal direction of blood flow towards the liver. Other: No RIGHT upper quadrant free fluid. IMPRESSION: 6 mm gallbladder polyp; no follow-up imaging recommended. Nonspecific mildly heterogeneous hepatic parenchymal echogenicity without mass or biliary dilatation. Electronically Signed   By: Ulyses Southward M.D.    On: 02/20/2021 18:13    Labs:  CBC: Recent Labs    02/18/21 1420 02/19/21 0206 02/20/21 0310 02/21/21 1137  WBC 3.3* 5.3 7.0 9.7  HGB 12.1 11.4* 10.8* 11.0*  HCT 35.7* 32.9* 32.1* 33.8*  PLT 254 100* 93* 143*    COAGS: Recent Labs    02/17/21 0047  INR 1.0    BMP: Recent Labs    02/19/21 0206 02/20/21 0310 02/21/21 1137 02/23/21 0357  NA 131* 130* 131* 130*  K 4.5 4.5 4.8 5.0  CL 97* 97* 97* 96*  CO2 20* 22 20* 23  GLUCOSE 139* 152* 144* 171*  BUN 81* 71* 98* 73*  CALCIUM 7.1* 7.7* 7.9* 7.6*  CREATININE 9.57* 9.47* 11.78* 10.72*  GFRNONAA 4* 5* 3* 4*    LIVER FUNCTION TESTS: Recent Labs    02/19/21 0206 02/20/21 0310 02/21/21 1137 02/22/21 0601 02/23/21 0357  BILITOT 0.5 0.6  --  0.7 0.6  AST 110* 163*  --  158* 106*  ALT 55* 81*  --  116* 94*  ALKPHOS 74 78  --  81 75  PROT 4.8* 5.3*  --  5.9* 5.7*  ALBUMIN 1.3* 1.6* 1.8* 2.0* 1.9*    Assessment and Plan: Pt known to IR service from temp HD cath placement on 02/18/21 and random right renal bx on 02/20/21. She is a 55 y.o. yo female with history of psoriasis, COVID-19 infection earlier this year, recent diagnosis of HTN (restarted HCTZ ) who presented with generalized body pain, shortness of breath and found to have AKI with nephrotic syndrome, lupus nephritis. Latest creatinine 10.72.Request now received from nephrology for [facility] placement. Details/risks of procedure, incl  but not limited to, internal bleeding, infection, injury to adjacent structures d/w pt with her understanding and consent.  Procedure planned for 3/25. Afebrile; WBC nl; hgb stable  Electronically Signed: D. Jeananne Rama, PA-C 02/23/2021, 1:36 PM   I spent a total of 20 minutes at the the patient's bedside AND on the patient's hospital floor or unit, greater than 50% of which was counseling/coordinating care for tunneled hemodialysis catheter placement    Patient ID: [name], female   DOB: [birth year], 55 y.o.   MRN:  [id number]

## 2021-02-24 ENCOUNTER — Inpatient Hospital Stay (HOSPITAL_COMMUNITY): Payer: BC Managed Care – PPO

## 2021-02-24 DIAGNOSIS — N179 Acute kidney failure, unspecified: Secondary | ICD-10-CM | POA: Diagnosis not present

## 2021-02-24 DIAGNOSIS — M3214 Glomerular disease in systemic lupus erythematosus: Secondary | ICD-10-CM | POA: Diagnosis not present

## 2021-02-24 HISTORY — PX: IR FLUORO GUIDE CV LINE RIGHT: IMG2283

## 2021-02-24 HISTORY — PX: IR US GUIDANCE: IMG2393

## 2021-02-24 LAB — PROTEIN ELECTROPHORESIS, SERUM
A/G Ratio: 0.6 — ABNORMAL LOW (ref 0.7–1.7)
Albumin ELP: 2.3 g/dL — ABNORMAL LOW (ref 2.9–4.4)
Alpha-1-Globulin: 0.2 g/dL (ref 0.0–0.4)
Alpha-2-Globulin: 1.1 g/dL — ABNORMAL HIGH (ref 0.4–1.0)
Beta Globulin: 0.8 g/dL (ref 0.7–1.3)
Gamma Globulin: 1.6 g/dL (ref 0.4–1.8)
Globulin, Total: 3.7 g/dL (ref 2.2–3.9)
Total Protein ELP: 6 g/dL (ref 6.0–8.5)

## 2021-02-24 LAB — CBC
HCT: 33.6 % — ABNORMAL LOW (ref 36.0–46.0)
Hemoglobin: 11 g/dL — ABNORMAL LOW (ref 12.0–15.0)
MCH: 29.6 pg (ref 26.0–34.0)
MCHC: 32.7 g/dL (ref 30.0–36.0)
MCV: 90.3 fL (ref 80.0–100.0)
Platelets: 134 10*3/uL — ABNORMAL LOW (ref 150–400)
RBC: 3.72 MIL/uL — ABNORMAL LOW (ref 3.87–5.11)
RDW: 13.8 % (ref 11.5–15.5)
WBC: 14.4 10*3/uL — ABNORMAL HIGH (ref 4.0–10.5)
nRBC: 0.1 % (ref 0.0–0.2)

## 2021-02-24 LAB — RENAL FUNCTION PANEL
Albumin: 1.9 g/dL — ABNORMAL LOW (ref 3.5–5.0)
Anion gap: 10 (ref 5–15)
BUN: 47 mg/dL — ABNORMAL HIGH (ref 6–20)
CO2: 26 mmol/L (ref 22–32)
Calcium: 7.7 mg/dL — ABNORMAL LOW (ref 8.9–10.3)
Chloride: 98 mmol/L (ref 98–111)
Creatinine, Ser: 8.55 mg/dL — ABNORMAL HIGH (ref 0.44–1.00)
GFR, Estimated: 5 mL/min — ABNORMAL LOW (ref >60)
Glucose, Bld: 145 mg/dL — ABNORMAL HIGH (ref 70–99)
Phosphorus: 7 mg/dL — ABNORMAL HIGH (ref 2.5–4.6)
Potassium: 4.8 mmol/L (ref 3.5–5.1)
Sodium: 134 mmol/L — ABNORMAL LOW (ref 135–145)

## 2021-02-24 LAB — HEPATIC FUNCTION PANEL
ALT: 76 U/L — ABNORMAL HIGH (ref 0–44)
AST: 79 U/L — ABNORMAL HIGH (ref 15–41)
Albumin: 2 g/dL — ABNORMAL LOW (ref 3.5–5.0)
Alkaline Phosphatase: 74 U/L (ref 38–126)
Bilirubin, Direct: 0.2 mg/dL (ref 0.0–0.2)
Indirect Bilirubin: 0.5 mg/dL (ref 0.3–0.9)
Total Bilirubin: 0.7 mg/dL (ref 0.3–1.2)
Total Protein: 6.1 g/dL — ABNORMAL LOW (ref 6.5–8.1)

## 2021-02-24 MED ORDER — ONDANSETRON 4 MG PO TBDP
8.0000 mg | ORAL_TABLET | Freq: Three times a day (TID) | ORAL | Status: DC | PRN
  Administered 2021-02-24: 8 mg via ORAL
  Filled 2021-02-24: qty 2

## 2021-02-24 MED ORDER — MIDAZOLAM HCL 2 MG/2ML IJ SOLN
INTRAMUSCULAR | Status: AC
  Filled 2021-02-24: qty 4

## 2021-02-24 MED ORDER — KIDNEY FAILURE BOOK: Freq: Once | Status: DC

## 2021-02-24 MED ORDER — FENTANYL CITRATE (PF) 100 MCG/2ML IJ SOLN
INTRAMUSCULAR | Status: AC
  Filled 2021-02-24: qty 4

## 2021-02-24 MED ORDER — MIDAZOLAM HCL 2 MG/2ML IJ SOLN
INTRAMUSCULAR | Status: AC | PRN
Start: 2021-02-24 — End: 2021-02-24
  Administered 2021-02-24: 1 mg via INTRAVENOUS

## 2021-02-24 MED ORDER — ONDANSETRON HCL 4 MG PO TABS
4.0000 mg | ORAL_TABLET | Freq: Once | ORAL | Status: AC
  Administered 2021-02-24: 4 mg via ORAL
  Filled 2021-02-24: qty 1

## 2021-02-24 MED ORDER — LIDOCAINE HCL 1 % IJ SOLN
INTRAMUSCULAR | Status: AC
  Filled 2021-02-24: qty 20

## 2021-02-24 MED ORDER — DEXTROSE 5 % IV SOLN
3.0000 g | INTRAVENOUS | Status: AC
  Administered 2021-02-24: 3 g via INTRAVENOUS
  Filled 2021-02-24: qty 3000

## 2021-02-24 MED ORDER — NEPRO/CARBSTEADY PO LIQD
237.0000 mL | Freq: Three times a day (TID) | ORAL | Status: DC
  Administered 2021-02-24 – 2021-03-01 (×11): 237 mL via ORAL

## 2021-02-24 MED ORDER — LIDOCAINE HCL 1 % IJ SOLN
INTRAMUSCULAR | Status: AC | PRN
Start: 2021-02-24 — End: 2021-02-24
  Administered 2021-02-24: 10 mL

## 2021-02-24 MED ORDER — HEPARIN SODIUM (PORCINE) 1000 UNIT/ML IJ SOLN
INTRAMUSCULAR | Status: AC
  Filled 2021-02-24: qty 1

## 2021-02-24 MED ORDER — FENTANYL CITRATE (PF) 100 MCG/2ML IJ SOLN
INTRAMUSCULAR | Status: AC | PRN
  Administered 2021-02-24: 50 ug via INTRAVENOUS

## 2021-02-24 NOTE — Progress Notes (Addendum)
PROGRESS NOTE                                                                                                                                                                                                             Patient Demographics:    Denise Watson, is a 55 y.o. female, DOB - 12-23-1965, ZOX:[id number]  Outpatient Primary MD for the patient is Piedad Climes, [place]    LOS - 8  Admit date - 02/16/2021    Chief Complaint  Patient presents with  . Shortness of Breath       Brief Narrative (HPI from H&P)  -  Orra Nolde  is a 55 y.o. female, who is in relatively good health except recent diagnosis of essential hypertension requiring HCTZ for the last few weeks, obesity, COVID-19 infection in January, psoriasis, C-section in the past, intermittent palpitations in the past.  Apparently patient has been having symptoms of sore throat for the last 2 weeks and subsequently has been developing gradual fatigue and some exertional shortness of breath, denies any fever chills, no chest or abdominal pain, no blood in stool or urine, no cough or sinus pressure, no swelling in arms or legs, she does have petechial rash but she says this is due to her psoriasis and is chronic.  She presented to the ER where she was found to have AKI with a creatinine of 10, nephrology was consulted and I was requested to admit the patient.  Currently review of systems positive for sore throat, fatigue, exertional shortness of breath, chronic petechial rash due to psoriasis according to the patient.  Other review of systems are negative   Subjective:   Patient some nausea earlier today, she remains with poor appetite, sitting in a chair this morning, when she had complaints of abdominal pain in the right side which has resolved, denies vomiting or diarrhea .   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 -Continue steroids, transition from IV Solu-Medrol to p.o. prednisone. 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 -TDC right IJ placed by IR 3/25. -Patient remains sleepy, poor appetite most likely due to uremia, I will obtain CT head . -started on cellcept  Generalized fatigue, exertional shortness of breath  and pharyngitis.   - In a patient with AKI, she was recently diagnosed with hypertension and started with HCTZ as well.  Petechial rash.  - Patient claims this is chronic due to psoriasis but could be due to platelet dysfunction caused by AKI.  Will monitor.  Platelet counts are stable pressure stable.  Borderline hyperkalemia.  - Stable after kayaxalate and HD.  Exertional shortness of breath -  likely due to AKI, stable TTE. HD now for any extra fluid removal.    Obesity.  BMI 39.  Follow with PCP.  Hypertension.  Gentle IV hydralazine, will avoid hypotension due to AKI.  Pharyngitis.  Strep throat culture negative, strep urinary antigen negative, ASO titer negative, likely viral pharyngitis  History of psoriasis.  Question if she has discoid lupus.  Outpatient dermatology follow-up.  Asymptomatic mild rise in LFTs.  Would be due to hepatic congestion along with acute hepatitis panel negative, she is symptom-free.Stable RUQ [place] with incidental gallbladder polyp, outpatient follow-up with PCP.  LFTs trending down.       Condition -   Guarded  Family Communication  : none at bedside  Code Status :  Full  Consults  :  Renal  PUD Prophylaxis :    Procedures  :     RUQ US - GB Polyp  R. Kidney biopsy 02/20/21  R.IJ HD Cath by IR 02/18/21  Renal US - medical renal disease.  TTE - 1. Left ventricular ejection fraction, by estimation, is 60%. The left ventricle has normal function. The left ventricle has no regional wall motion abnormalities. There is mild  left ventricular hypertrophy. Left ventricular diastolic parameters were low normal.  2. Right ventricular systolic function is normal. The right ventricular size is normal. Tricuspid regurgitation signal is inadequate for assessing PA pressure.  3. The mitral valve is normal in structure. Trivial mitral valve regurgitation. No evidence of mitral stenosis.  4. The aortic valve is tricuspid. Aortic valve regurgitation is not visualized. No aortic stenosis is present.  5. The inferior vena cava is normal in size with greater than 50% respiratory variability, suggesting right atrial pressure of 3 mmHg.       Disposition Plan  :    Status is: Inpatient  Remains inpatient appropriate because:IV treatments appropriate due to intensity of illness or inability to take PO   Dispo: The patient is from: Home              Anticipated d/c is to: Home              Patient currently is not medically stable to d/c.   Difficult to place patient No  DVT Prophylaxis  :  Heparin   Lab Results  Component Value Date   PLT 134 (L) 02/24/2021    Diet :  Diet Order            Diet renal/carb modified with fluid restriction Diet-HS Snack? Nothing; Fluid restriction: Other (see comments); Room service appropriate? Yes; Fluid consistency: Thin  Diet effective now                  Inpatient Medications  Scheduled Meds: . Chlorhexidine Gluconate Cloth  6 each Topical Q0600  . famotidine  10 mg Oral Daily  . feeding supplement (NEPRO CARB STEADY)  237 mL Oral TID BM  . heparin injection (subcutaneous)  5,000 Units Subcutaneous Q8H  . mycophenolate  250 mg Oral BID  . polyethylene glycol  17 g Oral Daily  .  predniSONE  60 mg Oral Q breakfast   Continuous Infusions:  PRN Meds:.acetaminophen **OR** [DISCONTINUED] acetaminophen, alum & mag hydroxide-simeth, bisacodyl, docusate sodium, hydrALAZINE, [DISCONTINUED] ondansetron **OR** ondansetron (ZOFRAN) IV, phenol, sodium chloride flush,  traMADol  Antibiotics  :    Anti-infectives (From admission, onward)   Start     Dose/Rate Route Frequency Ordered Stop   02/24/21 1400  ceFAZolin (ANCEF) 3 g in dextrose 5 % 50 mL IVPB  Status:  Discontinued        3 g 100 mL/hr over 30 Minutes Intravenous To Radiology 02/23/21 1350 02/24/21 1036   02/24/21 1045  ceFAZolin (ANCEF) 3 g in dextrose 5 % 50 mL IVPB        3 g 100 mL/hr over 30 Minutes Intravenous To Radiology 02/24/21 1036 02/24/21 1040       Esthela Brandner M.D on 02/24/2021 at 1:52 PM  To page go to [email or web address]   [facility] -  Office  [phone]   See all Orders from today for further details    Objective:   Vitals:   02/24/21 1110 02/24/21 1214 02/24/21 1230 02/24/21 1330  BP: (!) 127/95 135/78 (!) 143/81 (!) 149/100  Pulse: 80     Resp: Temp:    98.5 F (36.9 C)  TempSrc:    Oral  SpO2: 97%     Weight:      Height:        Wt Readings from Last 3 Encounters:  02/23/21 134.5 kg  01/27/21 108.9 kg  06/27/16 132 kg     Intake/Output Summary (Last 24 hours) at 02/24/2021 1352 Last data filed at 02/24/2021 0841 Gross per 24 hour  Intake 240 ml  Output 3000 ml  Net -2760 ml     Physical Exam  She is sleepy, but wakes up and answers questions appropriately, follow command, no focal deficits . awake Alert, Oriented X 3, No new F.N deficits, Normal affect Symmetrical Chest wall movement, Good air movement bilaterally, CTAB RRR,No Gallops,Rubs or new Murmurs, No Parasternal Heave +ve B.Sounds, Abd Soft, No tenderness, No rebound - guarding or rigidity. No Cyanosis, Clubbing , has trace edema   Data Review:    CBC Recent Labs  Lab 02/18/21 0230 02/18/21 1420 02/19/21 0206 02/20/21 0310 02/21/21 1137 02/24/21 0313  WBC 3.1* 3.3* 5.3 7.0 9.7 14.4*  HGB 10.8* 12.1 11.4* 10.8* 11.0* 11.0*  HCT 32.2* 35.7* 32.9* 32.1* 33.8* 33.6*  PLT 190 254 100* 93* 143* 134*  MCV 88.5 88.4 87.5 88.7 89.9 90.3  MCH 29.7 30.0  30.3 29.8 29.3 29.6  MCHC 33.5 33.9 34.7 33.6 32.5 32.7  RDW 13.6 13.8 13.6 13.6 13.8 13.8  LYMPHSABS 1.1  --  1.0 1.1  --   --   MONOABS 0.3  --  0.2 0.6  --   --   EOSABS 0.0  --  0.0 0.0  --   --   BASOSABS 0.0  --  0.0 0.0  --   --     Recent Labs  Lab 02/18/21 0230 02/18/21 1420 02/19/21 0206 02/20/21 0310 02/21/21 1137 02/22/21 0601 02/23/21 0357 02/24/21 0313  NA 131*   < > 131* 130* 131*  --  130* 134*  K 4.1   < > 4.5 4.5 4.8  --  5.0 4.8  CL 99   < > 97* 97* 97*  --  96* 98  CO2 18*   < > 20* 22 20*  --  23  26  GLUCOSE 99   < > 139* 152* 144*  --  171* 145*  BUN 114*   < > 81* 71* 98*  --  73* 47*  CREATININE 11.93*   < > 9.57* 9.47* 11.78*  --  10.72* 8.55*  CALCIUM 7.1*   < > 7.1* 7.7* 7.9*  --  7.6* 7.7*  AST 132*  --  110* 163*  --  158* 106* 79*  ALT 65*  --  55* 81*  --  116* 94* 76*  ALKPHOS 69  --  74 78  --  81 75 74  BILITOT 1.1  --  0.5 0.6  --  0.7 0.6 0.7  ALBUMIN 1.4*   < > 1.3* 1.6* 1.8* 2.0* 1.9* 2.0*  1.9*  MG 2.3  --  2.0 2.2  --   --   --   --   BNP 13.6  --  23.5 34.5  --   --   --   --    < > = values in this interval not displayed.    ------------------------------------------------------------------------------------------------------------------ No results for input(s): CHOL, HDL, LDLCALC, TRIG, CHOLHDL, LDLDIRECT in the last 72 hours.  Lab Results  Component Value Date   HGBA1C 6.3 (H) 02/16/2021   ------------------------------------------------------------------------------------------------------------------ No results for input(s): TSH, T4TOTAL, T3FREE, THYROIDAB in the last 72 hours.  Invalid input(s): FREET3  Cardiac Enzymes No results for input(s): CKMB, TROPONINI, MYOGLOBIN in the last 168 hours.  Invalid input(s): CK ------------------------------------------------------------------------------------------------------------------    Component Value Date/Time   BNP 34.5 02/20/2021 0310    Micro Results Recent  Results (from the past 240 hour(s))  SARS CORONAVIRUS 2 (TAT 6-24 HRS) Nasopharyngeal Nasopharyngeal Swab     Status: None   Collection Time: 02/16/21  4:25 PM   Specimen: Nasopharyngeal Swab  Result Value Ref Range Status   SARS Coronavirus 2 NEGATIVE NEGATIVE Final    Comment: (NOTE) SARS-CoV-2 target nucleic acids are NOT DETECTED.  The SARS-CoV-2 RNA is generally detectable in upper and lower respiratory specimens during the acute phase of infection. Negative results do not preclude SARS-CoV-2 infection, do not rule out co-infections with other pathogens, and should not be used as the sole basis for treatment or other patient management decisions. Negative results must be combined with clinical observations, patient history, and epidemiological information. The expected result is Negative.  Fact Sheet for Patients: HairSlick.no  Fact Sheet for Healthcare Providers: quierodirigir.com  This test is not yet approved or cleared by the [place] FDA and  has been authorized for detection and/or diagnosis of SARS-CoV-2 by FDA under an Emergency Use Authorization (EUA). This EUA will remain  in effect (meaning this test can be used) for the duration of the COVID-19 declaration under Se ction 564(b)(1) of the Act, 21 U.S.C. section 360bbb-3(b)(1), unless the authorization is terminated or revoked sooner.  Performed at [facility], [address], [place], [place] [address]   Respiratory (~20 pathogens) panel by PCR     Status: None   Collection Time: 02/16/21  5:06 PM   Specimen: Nasopharyngeal Swab; Respiratory  Result Value Ref Range Status   Adenovirus NOT DETECTED NOT DETECTED Final   Coronavirus 229E NOT DETECTED NOT DETECTED Final    Comment: (NOTE) The Coronavirus on the Respiratory Panel, DOES NOT test for the novel  Coronavirus (2019 nCoV)    Coronavirus HKU1 NOT DETECTED NOT DETECTED Final    Coronavirus NL63 NOT DETECTED NOT DETECTED Final   Coronavirus OC43 NOT DETECTED  NOT DETECTED Final   Metapneumovirus NOT DETECTED NOT DETECTED Final   Rhinovirus / Enterovirus NOT DETECTED NOT DETECTED Final   Influenza A NOT DETECTED NOT DETECTED Final   Influenza B NOT DETECTED NOT DETECTED Final   Parainfluenza Virus 1 NOT DETECTED NOT DETECTED Final   Parainfluenza Virus 2 NOT DETECTED NOT DETECTED Final   Parainfluenza Virus 3 NOT DETECTED NOT DETECTED Final   Parainfluenza Virus 4 NOT DETECTED NOT DETECTED Final   Respiratory Syncytial Virus NOT DETECTED NOT DETECTED Final   Bordetella pertussis NOT DETECTED NOT DETECTED Final   Bordetella Parapertussis NOT DETECTED NOT DETECTED Final   Chlamydophila pneumoniae NOT DETECTED NOT DETECTED Final   Mycoplasma pneumoniae NOT DETECTED NOT DETECTED Final    Comment: Performed at [facility], [address] [place], [place] [address]  Culture, group A strep     Status: None   Collection Time: 02/17/21 11:24 AM   Specimen: Throat  Result Value Ref Range Status   Specimen Description THROAT  Final   Special Requests Normal  Final   Culture   Final    NO GROUP A STREP (S.PYOGENES) ISOLATED Performed at [facility], [address], [place], [place] [address]    Report Status 02/19/2021 FINAL  Final    Radiology Reports CT ABDOMEN PELVIS WO CONTRAST  Result Date: 02/16/2021 CLINICAL DATA:  Abdominal pain. EXAM: CT ABDOMEN AND PELVIS WITHOUT CONTRAST TECHNIQUE: Multidetector CT imaging of the abdomen and pelvis was performed following the standard protocol without IV contrast. COMPARISON:  None. FINDINGS: Lower chest: No acute abnormality. Hepatobiliary: No focal liver abnormality is seen. No gallstones, gallbladder wall thickening, or biliary dilatation. Pancreas: Unremarkable. No pancreatic ductal dilatation or surrounding inflammatory changes. Spleen: Normal in size without focal abnormality. Adrenals/Urinary Tract: Adrenal  glands appear normal. No hydronephrosis or renal obstruction is noted. No renal or ureteral calculi are noted. Urinary bladder is decompressed secondary to Foley catheter. Minimal inflammatory changes are noted around the right kidney suggesting possible pyelonephritis. Stomach/Bowel: Stomach is within normal limits. Appendix appears normal. No evidence of bowel wall thickening, distention, or inflammatory changes. Vascular/Lymphatic: No significant vascular findings are present. No enlarged abdominal or pelvic lymph nodes. Reproductive: No adnexal abnormality is noted. Probable small exophytic uterine fibroid is noted arising posteriorly from the uterus. Other: No abdominal wall hernia or abnormality. No abdominopelvic ascites. Musculoskeletal: No acute or significant osseous findings. IMPRESSION: 1. Minimal inflammatory changes are noted around the right kidney suggesting possible pyelonephritis. 2. No hydronephrosis or renal obstruction is noted. No renal or ureteral calculi are noted. 3. Probable small exophytic uterine fibroid is noted arising posteriorly from the uterus. Electronically Signed   By: Lupita Raider M.D.   On: 02/16/2021 18:32   CT Angio Head W or Wo Contrast  Result Date: 01/27/2021 CLINICAL DATA:  Diplopia.  Dural sinus thrombosis suspected. EXAM: CT ANGIOGRAPHY HEAD CT VENOGRAM HEAD TECHNIQUE: Multidetector CT imaging of the head was performed using the standard protocol during bolus administration of intravenous contrast. Multiplanar CT image reconstructions and MIPs were obtained to evaluate the arterial anatomy. Additional delayed multidetector CT imaging of the head was performed with multiplanar CT image reconstructions and MIPS to evaluate the venous anatomy. CONTRAST:  OMNIPAQUE IOHEXOL 350 MG/ML SOLN COMPARISON:  None. FINDINGS: CT HEAD Brain: No evidence of acute large vascular territory infarction, acute hemorrhage, hydrocephalus, extra-axial collection or mass lesion/mass  effect. Mild narrowing of the mamillopontine distance and effacement of the suprasellar cistern  with trace tonsillar ectopia. Skull: No acute fracture. Sinuses: Visualized sinuses are clear. Orbits: No acute finding. CTA HEAD Due to venous timing, significantly limited evaluation. The intradural left vertebral artery is poorly opacified, which could relate to stenosis or non dominant vertebral artery. Otherwise, no evidence of proximal large vessel occlusion or large aneurysm in the anterior or posterior circulation. CT VENOGRAM No evidence of dural sinus thrombosis. Small left transverse sinus and narrowing of the distal right transverse sinus. Small rounded filling defect in the distal right transverse sinus likely represents arachnoid granulation. IMPRESSION: CT head: 1. No definite evidence of acute intracranial abnormality. 2. Mild narrowing of the mamillopontine distance and effacement of the suprasellar cistern with trace tonsillar ectopia. Given the patient is young and has relative paucity of CSF spaces/sulci diffusely these findings may be within normal limits, but recommend correlation with signs/symptoms of intracranial hypotension including positional headaches. Additionally, per Dr. Rush Landmark the patient is going to get a follow-up MRI which can further evaluate these findings. CTA head: Due to venous timing, significantly limited evaluation. The intradural left vertebral artery is not well opacified, which is poorly evaluated and could relate to nondominant vertebral artery or stenosis. A CTA neck could further evaluate if clinically indicated. Otherwise, no evidence of proximal large vessel occlusion or large aneurysm. A CTA of the neck could further evaluate the vertebral finding and a repeat CTA head could provide more sensitive intracranial arterial evaluation if clinically indicated. CTV: No evidence of dural sinus thrombosis. Findings discussed with Dr. Julieanne Manson at 3:05 PM via telephone.  Electronically Signed   By: Feliberto Harts MD   On: 01/27/2021 15:15   DG Chest 2 View  Result Date: 02/16/2021 CLINICAL DATA:  Shortness of breath. EXAM: CHEST - 2 VIEW COMPARISON:  January 27, 2021. FINDINGS: The heart size and mediastinal contours are within normal limits. Both lungs are clear. No pneumothorax or pleural effusion is noted. The visualized skeletal structures are unremarkable. IMPRESSION: No active cardiopulmonary disease. Electronically Signed   By: Lupita Raider M.D.   On: 02/16/2021 12:53   US RENAL  Result Date: 02/21/2021 CLINICAL DATA:  Acute renal insufficiency EXAM: RENAL / URINARY TRACT ULTRASOUND COMPLETE COMPARISON:  02/20/2021 right upper quadrant ultrasound. Renal ultrasound 02/16/2021. FINDINGS: Right Kidney: Renal measurements: 12.3 by 6.0 x 5.5 cm = volume: 213 mL. Increased renal echogenicity. No hydronephrosis. Left Kidney: Renal measurements: 10.0 x 5.3 x 4.4 cm = volume: 128 mL. Increased renal echogenicity. No hydronephrosis. Bladder: Collapsed around a Foley catheter. Other: None. IMPRESSION: No hydronephrosis. Increased renal echogenicity, suggesting medical renal disease. Electronically Signed   By: Jeronimo Greaves M.D.   On: 02/21/2021 21:36   US Renal  Result Date: 02/16/2021 CLINICAL DATA:  Renal failure EXAM: RENAL / URINARY TRACT ULTRASOUND COMPLETE COMPARISON:  CT 02/16/2021 FINDINGS: Right Kidney: Renal measurements: 11.9 x 5.8 x 4.4 cm = volume: 159.2 mL. Cortex is echogenic. No mass or hydronephrosis. Trace perinephric fluid Left Kidney: Renal measurements: 9.7 x 5.2 x 4.9 cm = volume: 129.5 mL. Cortex is echogenic. No mass or hydronephrosis. Bladder: Empty by Foley catheter. Other: None. IMPRESSION: Echogenic kidneys bilaterally consistent with medical renal disease. No hydronephrosis Electronically Signed   By: Jasmine Pang M.D.   On: 02/16/2021 20:23   IR Fluoro Guide CV Line Right  Result Date: 02/24/2021 CLINICAL DATA:  Renal failure and  need for hemodialysis. The patient currently has a non tunneled temporary dialysis catheter via the right internal jugular vein and requires  a tunneled dialysis catheter for longer-term hemodialysis. EXAM: TUNNELED CENTRAL VENOUS HEMODIALYSIS CATHETER PLACEMENT WITH ULTRASOUND AND FLUOROSCOPIC GUIDANCE ANESTHESIA/SEDATION: 1.0 mg IV Versed; 50 mcg IV Fentanyl. Total Moderate Sedation Time:   25 minutes. The patient's level of consciousness and physiologic status were continuously monitored during the procedure by Radiology nursing. MEDICATIONS: 3 g IV Ancef. FLUOROSCOPY TIME:  24 seconds.  2.5 mGy. PROCEDURE: The procedure, risks, benefits, and alternatives were explained to the patient. Questions regarding the procedure were encouraged and answered. The patient understands and consents to the procedure. A timeout was performed prior to initiating the procedure. The pre-existing non tunneled right jugular temporary dialysis catheter was removed and hemostasis obtained with manual compression over the exit site. Ultrasound was used to confirm patency of the right internal jugular vein after non tunneled catheter removal. The right neck and chest were prepped with chlorhexidine in a sterile fashion, and a sterile drape was applied covering the operative field. Maximum barrier sterile technique with sterile gowns and gloves were used for the procedure. Local anesthesia was provided with 1% lidocaine. After creating a small venotomy incision, a 21 gauge needle was advanced into the right internal jugular vein under direct, real-time ultrasound guidance. Ultrasound image documentation was performed. After securing guidewire access, an 8 Fr dilator was placed. A J-wire was kinked to measure appropriate catheter length. A Palindrome tunneled hemodialysis catheter measuring 19 cm from tip to cuff was chosen for placement. This was tunneled in a retrograde fashion from the chest wall to the venotomy incision. At the  venotomy, serial dilatation was performed and a 15 Fr peel-away sheath was placed over a guidewire. The catheter was then placed through the sheath and the sheath removed. Final catheter positioning was confirmed and documented with a fluoroscopic spot image. The catheter was aspirated, flushed with saline, and injected with appropriate volume heparin dwells. The venotomy incision was closed with subcuticular 4-0 Vicryl. Dermabond was applied to the incision. The catheter exit site was secured with 0-Prolene retention sutures. COMPLICATIONS: None.  No pneumothorax. FINDINGS: After catheter placement, the tip lies in the right atrium. The catheter aspirates normally and is ready for immediate use. IMPRESSION: Placement of tunneled hemodialysis catheter via the right internal jugular vein. The catheter tip lies in the right atrium. The catheter is ready for immediate use. Electronically Signed   By: Irish Lack M.D.   On: 02/24/2021 12:01   IR Fluoro Guide CV Line Right  Result Date: 02/18/2021 INDICATION: 55 year old female referred for temporary hemodialysis catheter placement EXAM: IMAGE GUIDED PLACEMENT OF TEMPORARY HEMODIALYSIS CATHETER MEDICATIONS: None ANESTHESIA/SEDATION: Moderate (conscious) sedation was employed during this procedure. A total of Versed 1.0 mg and Fentanyl 50 mcg was administered intravenously. Moderate Sedation Time: 13 minutes. The patient's level of consciousness and vital signs were monitored continuously by radiology nursing throughout the procedure under my direct supervision. FLUOROSCOPY TIME:  Fluoroscopy Time: 0 minutes 6 seconds (1 mGy). COMPLICATIONS: None PROCEDURE: Informed written consent was obtained from the patient and the patient's family after a thorough discussion of the procedural risks, benefits and alternatives. All questions were addressed. A timeout was performed prior to the initiation of the procedure. The right neck and chest was prepped with chlorhexidine,  and draped in the usual sterile fashion using maximum barrier technique (cap and mask, sterile gown, sterile gloves, large sterile sheet, hand hygiene and cutaneous antiseptic). Local anesthesia was attained by infiltration with 1% lidocaine without epinephrine. Ultrasound demonstrated patency of the right internal jugular vein, and  this was documented with an image. Under real-time ultrasound guidance, this vein was accessed with a 21 gauge micropuncture needle and image documentation was performed. A small dermatotomy was made at the access site with an 11 scalpel. A 0.018" wire was advanced into the SVC and the access needle exchanged for a 62F micropuncture vascular sheath. The 0.018" wire was then removed and a 0.035" wire advanced into the IVC. Upon withdrawal of the 018 wire, the wire was marked for appropriate length of the internal portion of the catheter. A 16 cm catheter was selected. Skin and subcutaneous tissues were serially dilated. Catheter was placed on the wire. The catheter tip is positioned in the upper right atrium. This was documented with a spot image. Both ports of the hemodialysis catheter were then tested for excellent function. The ports were then locked with heparinized lock. Patient tolerated the procedure well and remained hemodynamically stable throughout. No complications were encountered and no significant blood loss was encountered. IMPRESSION: Status post image guided placement of temporary hemodialysis catheter. Catheter may be converted if needed. Signed, Yvone Neu. Reyne Dumas, RPVI [facility] Radiology Electronically Signed   By: Gilmer Mor D.O.   On: 02/18/2021 14:21   IR US Guide Vasc Access Right  Result Date: 02/18/2021 INDICATION: 55 year old female referred for temporary hemodialysis catheter placement EXAM: IMAGE GUIDED PLACEMENT OF TEMPORARY HEMODIALYSIS CATHETER MEDICATIONS: None ANESTHESIA/SEDATION: Moderate  (conscious) sedation was employed during this procedure. A total of Versed 1.0 mg and Fentanyl 50 mcg was administered intravenously. Moderate Sedation Time: 13 minutes. The patient's level of consciousness and vital signs were monitored continuously by radiology nursing throughout the procedure under my direct supervision. FLUOROSCOPY TIME:  Fluoroscopy Time: 0 minutes 6 seconds (1 mGy). COMPLICATIONS: None PROCEDURE: Informed written consent was obtained from the patient and the patient's family after a thorough discussion of the procedural risks, benefits and alternatives. All questions were addressed. A timeout was performed prior to the initiation of the procedure. The right neck and chest was prepped with chlorhexidine, and draped in the usual sterile fashion using maximum barrier technique (cap and mask, sterile gown, sterile gloves, large sterile sheet, hand hygiene and cutaneous antiseptic). Local anesthesia was attained by infiltration with 1% lidocaine without epinephrine. Ultrasound demonstrated patency of the right internal jugular vein, and this was documented with an image. Under real-time ultrasound guidance, this vein was accessed with a 21 gauge micropuncture needle and image documentation was performed. A small dermatotomy was made at the access site with an 11 scalpel. A 0.018" wire was advanced into the SVC and the access needle exchanged for a 62F micropuncture vascular sheath. The 0.018" wire was then removed and a 0.035" wire advanced into the IVC. Upon withdrawal of the 018 wire, the wire was marked for appropriate length of the internal portion of the catheter. A 16 cm catheter was selected. Skin and subcutaneous tissues were serially dilated. Catheter was placed on the wire. The catheter tip is positioned in the upper right atrium. This was documented with a spot image. Both ports of the hemodialysis catheter were then tested for excellent function. The ports were then locked with  heparinized lock. Patient tolerated the procedure well and remained hemodynamically stable throughout. No complications were encountered and no significant blood loss was encountered. IMPRESSION: Status post image guided placement of temporary hemodialysis catheter. Catheter may be converted if needed. Signed, Yvone Neu. Reyne Dumas, RPVI [facility] Radiology Electronically Signed  By: Gilmer Mor D.O.   On: 02/18/2021 14:21   IR US Guidance  Result Date: 02/24/2021 CLINICAL DATA:  Renal failure and need for hemodialysis. The patient currently has a non tunneled temporary dialysis catheter via the right internal jugular vein and requires a tunneled dialysis catheter for longer-term hemodialysis. EXAM: TUNNELED CENTRAL VENOUS HEMODIALYSIS CATHETER PLACEMENT WITH ULTRASOUND AND FLUOROSCOPIC GUIDANCE ANESTHESIA/SEDATION: 1.0 mg IV Versed; 50 mcg IV Fentanyl. Total Moderate Sedation Time:   25 minutes. The patient's level of consciousness and physiologic status were continuously monitored during the procedure by Radiology nursing. MEDICATIONS: 3 g IV Ancef. FLUOROSCOPY TIME:  24 seconds.  2.5 mGy. PROCEDURE: The procedure, risks, benefits, and alternatives were explained to the patient. Questions regarding the procedure were encouraged and answered. The patient understands and consents to the procedure. A timeout was performed prior to initiating the procedure. The pre-existing non tunneled right jugular temporary dialysis catheter was removed and hemostasis obtained with manual compression over the exit site. Ultrasound was used to confirm patency of the right internal jugular vein after non tunneled catheter removal. The right neck and chest were prepped with chlorhexidine in a sterile fashion, and a sterile drape was applied covering the operative field. Maximum barrier sterile technique with sterile gowns and gloves were used for the procedure. Local anesthesia  was provided with 1% lidocaine. After creating a small venotomy incision, a 21 gauge needle was advanced into the right internal jugular vein under direct, real-time ultrasound guidance. Ultrasound image documentation was performed. After securing guidewire access, an 8 Fr dilator was placed. A J-wire was kinked to measure appropriate catheter length. A Palindrome tunneled hemodialysis catheter measuring 19 cm from tip to cuff was chosen for placement. This was tunneled in a retrograde fashion from the chest wall to the venotomy incision. At the venotomy, serial dilatation was performed and a 15 Fr peel-away sheath was placed over a guidewire. The catheter was then placed through the sheath and the sheath removed. Final catheter positioning was confirmed and documented with a fluoroscopic spot image. The catheter was aspirated, flushed with saline, and injected with appropriate volume heparin dwells. The venotomy incision was closed with subcuticular 4-0 Vicryl. Dermabond was applied to the incision. The catheter exit site was secured with 0-Prolene retention sutures. COMPLICATIONS: None.  No pneumothorax. FINDINGS: After catheter placement, the tip lies in the right atrium. The catheter aspirates normally and is ready for immediate use. IMPRESSION: Placement of tunneled hemodialysis catheter via the right internal jugular vein. The catheter tip lies in the right atrium. The catheter is ready for immediate use. Electronically Signed   By: Irish Lack M.D.   On: 02/24/2021 12:01   DG Chest Portable 1 View  Result Date: 01/27/2021 CLINICAL DATA:  Persistent cough. Recurrent upper respiratory infection EXAM: PORTABLE CHEST 1 VIEW COMPARISON:  Radiograph 06/27/2016 FINDINGS: 1355 hours. The heart size and mediastinal contours are normal. The lungs are clear. There is no pleural effusion or pneumothorax. No acute osseous findings are identified. Telemetry leads overlie the chest. IMPRESSION: No active  cardiopulmonary process. Electronically Signed   By: Carey Bullocks M.D.   On: 01/27/2021 14:47   ECHOCARDIOGRAM COMPLETE  Result Date: 02/17/2021    ECHOCARDIOGRAM REPORT   Patient Name:   MARLYN TONDREAU Date of Exam: 02/17/2021 Medical Rec #:  [id number]       Height:       65.0 in Accession #:    [id number]      Weight:  240.0 lb Date of Birth:  06/06/1966        BSA:          2.138 m Patient Age:    54 years        BP:           120/60 mmHg Patient Gender: F               HR:           67 bpm. Exam Location:  Inpatient Procedure: 2D Echo Indications:    CHF-Acute Diastolic I50.31  History:        Patient has no prior history of Echocardiogram examinations.  Sonographer:    Thurman Coyerasey Kirkpatrick RDCS (AE) Referring Phys: [id number] ScotlandRASHANT K [facility] IMPRESSIONS  1. Left ventricular ejection fraction, by estimation, is 60%. The left ventricle has normal function. The left ventricle has no regional wall motion abnormalities. There is mild left ventricular hypertrophy. Left ventricular diastolic parameters were low normal.  2. Right ventricular systolic function is normal. The right ventricular size is normal. Tricuspid regurgitation signal is inadequate for assessing PA pressure.  3. The mitral valve is normal in structure. Trivial mitral valve regurgitation. No evidence of mitral stenosis.  4. The aortic valve is tricuspid. Aortic valve regurgitation is not visualized. No aortic stenosis is present.  5. The inferior vena cava is normal in size with greater than 50% respiratory variability, suggesting right atrial pressure of 3 mmHg. FINDINGS  Left Ventricle: Left ventricular ejection fraction, by estimation, is 60%. The left ventricle has normal function. The left ventricle has no regional wall motion abnormalities. The left ventricular internal cavity size was normal in size. There is mild left ventricular hypertrophy. Left ventricular diastolic parameters were normal. Right Ventricle: The right ventricular size is  normal. No increase in right ventricular wall thickness. Right ventricular systolic function is normal. Tricuspid regurgitation signal is inadequate for assessing PA pressure. Left Atrium: Left atrial size was normal in size. Right Atrium: Right atrial size was normal in size. Pericardium: There is no evidence of pericardial effusion. Mitral Valve: The mitral valve is normal in structure. Trivial mitral valve regurgitation. No evidence of mitral valve stenosis. Tricuspid Valve: The tricuspid valve is normal in structure. Tricuspid valve regurgitation is trivial. No evidence of tricuspid stenosis. Aortic Valve: The aortic valve is tricuspid. Aortic valve regurgitation is not visualized. No aortic stenosis is present. Pulmonic Valve: The pulmonic valve was normal in structure. Pulmonic valve regurgitation is trivial. No evidence of pulmonic stenosis. Aorta: The aortic root is normal in size and structure. Venous: The inferior vena cava is normal in size with greater than 50% respiratory variability, suggesting right atrial pressure of 3 mmHg. IAS/Shunts: The interatrial septum was not well visualized.  LEFT VENTRICLE PLAX 2D LVIDd:         4.10 cm  Diastology LVIDs:         2.80 cm  LV e' medial:    6.53 cm/s LV PW:         1.20 cm  LV E/e' medial:  11.0 LV IVS:        1.20 cm  LV e' lateral:   9.57 cm/s LVOT diam:     2.10 cm  LV E/e' lateral: 7.5 LV SV:         76 LV SV Index:   36 LVOT Area:     3.46 cm  RIGHT VENTRICLE RV S prime:     15.40 cm/s TAPSE (M-mode): 1.4 cm  LEFT ATRIUM             Index       RIGHT ATRIUM          Index LA diam:        3.10 cm 1.45 cm/m  RA Area:     9.08 cm LA Vol (A2C):   27.6 ml 12.91 ml/m RA Volume:   15.80 ml 7.39 ml/m LA Vol (A4C):   39.6 ml 18.53 ml/m LA Biplane Vol: 35.3 ml 16.51 ml/m  AORTIC VALVE LVOT Vmax:   107.00 cm/s LVOT Vmean:  64.800 cm/s LVOT VTI:    0.220 m  AORTA Ao Root diam: 2.80 cm MITRAL VALVE MV Area (PHT): 2.42 cm    SHUNTS MV Decel Time: 313 msec     Systemic VTI:  0.22 m MV E velocity: 71.60 cm/s  Systemic Diam: 2.10 cm MV A velocity: 73.30 cm/s MV E/A ratio:  0.98 Weston Brass MD Electronically signed by Weston Brass MD Signature Date/Time: 02/17/2021/1:13:33 PM    Final    US BIOPSY (KIDNEY)  Result Date: 02/20/2021 INDICATION: 4 year old with acute kidney injury and request for renal biopsy. EXAM: ULTRASOUND-GUIDED RANDOM RENAL BIOPSY MEDICATIONS: Moderate sedation none. ANESTHESIA/SEDATION: Moderate (conscious) sedation was employed during this procedure. A total of Versed 1.0 mg and Fentanyl 50 mcg was administered intravenously. Moderate Sedation Time: 15 minutes. The patient's level of consciousness and vital signs were monitored continuously by radiology nursing throughout the procedure under my direct supervision. FLUOROSCOPY TIME:  None COMPLICATIONS: None immediate. PROCEDURE: Informed written consent was obtained from the patient after a thorough discussion of the procedural risks, benefits and alternatives. All questions were addressed. A timeout was performed prior to the initiation of the procedure. Patient was placed prone. Both kidneys were evaluated with ultrasound. The right kidney was targeted for biopsy. The right flank was prepped with chlorhexidine and sterile field was created. Maximal barrier sterile technique was utilized including caps, mask, sterile gowns, sterile gloves, sterile drape, hand hygiene and skin antiseptic. Skin was anesthetized using 1% lidocaine. A small incision was made. Using ultrasound guidance, 16 gauge core biopsy was directed into the right kidney lower pole. Adequate specimen was obtained and placed in saline. A second ultrasound-guided core biopsy was obtained from the right kidney lower pole. Adequate specimen was obtained and placed in saline. Bandage placed over the puncture site. FINDINGS: Negative for hydronephrosis. Two core biopsies obtained from the right kidney lower pole. No significant  bleeding or hematoma formation. IMPRESSION: Successful ultrasound-guided random core biopsies from the right kidney lower pole. Electronically Signed   By: Richarda Overlie M.D.   On: 02/20/2021 17:20   CT VENOGRAM HEAD  Result Date: 01/27/2021 CLINICAL DATA:  Diplopia.  Dural sinus thrombosis suspected. EXAM: CT ANGIOGRAPHY HEAD CT VENOGRAM HEAD TECHNIQUE: Multidetector CT imaging of the head was performed using the standard protocol during bolus administration of intravenous contrast. Multiplanar CT image reconstructions and MIPs were obtained to evaluate the arterial anatomy. Additional delayed multidetector CT imaging of the head was performed with multiplanar CT image reconstructions and MIPS to evaluate the venous anatomy. CONTRAST:  OMNIPAQUE IOHEXOL 350 MG/ML SOLN COMPARISON:  None. FINDINGS: CT HEAD Brain: No evidence of acute large vascular territory infarction, acute hemorrhage, hydrocephalus, extra-axial collection or mass lesion/mass effect. Mild narrowing of the mamillopontine distance and effacement of the suprasellar cistern with trace tonsillar ectopia. Skull: No acute fracture. Sinuses: Visualized sinuses are clear. Orbits: No acute finding. CTA HEAD Due to venous  timing, significantly limited evaluation. The intradural left vertebral artery is poorly opacified, which could relate to stenosis or non dominant vertebral artery. Otherwise, no evidence of proximal large vessel occlusion or large aneurysm in the anterior or posterior circulation. CT VENOGRAM No evidence of dural sinus thrombosis. Small left transverse sinus and narrowing of the distal right transverse sinus. Small rounded filling defect in the distal right transverse sinus likely represents arachnoid granulation. IMPRESSION: CT head: 1. No definite evidence of acute intracranial abnormality. 2. Mild narrowing of the mamillopontine distance and effacement of the suprasellar cistern with trace tonsillar ectopia. Given the patient is  young and has relative paucity of CSF spaces/sulci diffusely these findings may be within normal limits, but recommend correlation with signs/symptoms of intracranial hypotension including positional headaches. Additionally, per Dr. Rush Landmark the patient is going to get a follow-up MRI which can further evaluate these findings. CTA head: Due to venous timing, significantly limited evaluation. The intradural left vertebral artery is not well opacified, which is poorly evaluated and could relate to nondominant vertebral artery or stenosis. A CTA neck could further evaluate if clinically indicated. Otherwise, no evidence of proximal large vessel occlusion or large aneurysm. A CTA of the neck could further evaluate the vertebral finding and a repeat CTA head could provide more sensitive intracranial arterial evaluation if clinically indicated. CTV: No evidence of dural sinus thrombosis. Findings discussed with Dr. Julieanne Manson at 3:05 PM via telephone. Electronically Signed   By: Feliberto Harts MD   On: 01/27/2021 15:15   US Abdomen Limited RUQ (LIVER/GB)  Result Date: 02/20/2021 CLINICAL DATA:  Transaminitis EXAM: ULTRASOUND ABDOMEN LIMITED RIGHT UPPER QUADRANT COMPARISON:  CT abdomen and pelvis 02/16/2021 FINDINGS: Gallbladder: Normally distended. 6 mm nonshadowing intraluminal echogenic focus likely small polyp; no follow-up imaging recommended. No gallbladder wall thickening, pericholecystic fluid or sonographic Murphy sign. No discrete shadowing calculi. Common bile duct: Diameter: 2 mm Liver: Slightly heterogeneous echogenicity, nonspecific. No discrete hepatic mass or nodularity. No intrahepatic biliary dilatation. Portal vein is patent on color Doppler imaging with normal direction of blood flow towards the liver. Other: No RIGHT upper quadrant free fluid. IMPRESSION: 6 mm gallbladder polyp; no follow-up imaging recommended. Nonspecific mildly heterogeneous hepatic parenchymal echogenicity without mass or biliary  dilatation. Electronically Signed   By: Ulyses Southward M.D.   On: 02/20/2021 18:13

## 2021-02-24 NOTE — Progress Notes (Signed)
[facility] NEPHROLOGY PROGRESS NOTE  Assessment/ Plan: Pt is a 54 y37.o. yo female with history of psoriasis, COVID-19 infection, recent diagnosis of HTN restarted HCTZ presented with generalized body pain, shortness of breath and found to have AKI.  Acute kidney injury with nephrotic syndrome, dialysis-dependent: The acute elevation of serum creatinine level to 10 from normal creatinine about a month ago. The urine study with proteinuria around 6g, serum albumin 1.5 and trace edema. No microscopic hematuria or WBC in the first urinalysis.   Repeat urinalysis was after placement of Foley catheter.  Kidney ultrasound without hydronephrosis. The lab result came back with elevated antidsDNA >300 with low C3 and C4 and high ANA titers. 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  Full house staining for immunoglobins and complements as well as tubular basement membrane deposits.  Her GN has low activity and low chronicity. Intense full house staining. Her acute tubular injury is likely related to collapsing glomerulopathy. Final biopsy report pending. -We started solumedrol 250 mg IV on 3/19 for 3 days and now on prednisone 60 mg as of 3/22, plan to taper in 2 weeks, plan to taper to 40mg  on 4/5. Further IS as below -Started dialysis on 3/19 for oliguric AKI and azotemia. RIJ temp HD catheter placed by IR. -TDC placed 3/25 with IR -HD tomorrow, running on TTS sched  Lupus Nephritis Class 3 -full house staining, low chronicity/low activity -Continue with prednisone 60 mg, plan to taper to 40 mg on 4/5 -We will start CellCept, low-dose 25mg  bid.  Side effect profile reviewed with patient on 3/24.  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  I did not expect this  histopathological finding on her biopsy.  I do wonder if there is genetic component (APOL1 mutation) to this or if this is related to her Covid infection back in January (however this was a presumed diagnosis, was not tested). Interestingly, her father was on dialysis by the age of 54 until he passed in his 3740's, cause of his ESRD was unknown and thought to be secondary to HTN, wonder if he had a similar presentation.  Nonetheless this is an overall poor kidney prognostic sign.  She is on steroids for LN which we will continue also for this.  Final biopsy report pending -24 hr urine for proteinuria ordered  Hypertension: Blood pressure acceptable.  UF as tolerated  Exertional shortness of breath: Chest x-ray negative.    Echo with EF of 60%.  UF with HD.  Sore throat: Reportedly she was treated with penicillin by her PCP.    ASO titer not elevated.  Transaminitis: work up per primary  Hyponatremia: managing with HD, UF as tolerated  Nausea/vomiting -would not expect this to be uremic symptoms. Her BUN is down to 47 and she just had HD yesterday (she was asymptomatic during HD). Symptoms started after she had the procedure today, possible sedation reaction? If not any better (along with her abd pain), would recommend checking a CT, will defer to primary service  Subjective: Seen and examined at bedside.  Had a rough day. Started having nausea/vomiting after TDC placement. Still having abdominal pain. She's been asleep most of the day and is still sleepy. Discussed with sister at the bedside  Objective Vital signs in last 24 hours: Vitals:   02/24/21 1230  02/24/21 1330 02/24/21 1417 02/24/21 1500  BP: (!) 143/81 (!) 149/100 140/83 (!) 136/92  Pulse:   80   Resp: 15 18 16 14   Temp:  98.5 F (36.9 C) 98.5 F (36.9 C)   TempSrc:  Oral Oral   SpO2:   100%   Weight:      Height:       Weight change:   Intake/Output Summary (Last 24 hours) at 02/24/2021 1947 Last data filed at 02/24/2021  0841 Gross per 24 hour  Intake 0 ml  Output -  Net 0 ml       Labs: Basic Metabolic Panel: Recent Labs  Lab 02/18/21 1420 02/19/21 0206 02/21/21 1137 02/23/21 0357 02/24/21 0313  NA 132*   < > 131* 130* 134*  K 4.8   < > 4.8 5.0 4.8  CL 98   < > 97* 96* 98  CO2 18*   < > 20* 23 26  GLUCOSE 103*   < > 144* 171* 145*  BUN 119*   < > 98* 73* 47*  CREATININE 12.28*   < > 11.78* 10.72* 8.55*  CALCIUM 7.2*   < > 7.9* 7.6* 7.7*  PHOS 9.0*  --  8.6*  --  7.0*   < > = values in this interval not displayed.   Liver Function Tests: Recent Labs  Lab 02/22/21 0601 02/23/21 0357 02/24/21 0313  AST 158* 106* 79*  ALT 116* 94* 76*  ALKPHOS 81 75 74  BILITOT 0.7 0.6 0.7  PROT 5.9* 5.7* 6.1*  ALBUMIN 2.0* 1.9* 2.0*  1.9*   No results for input(s): LIPASE, AMYLASE in the last 168 hours. No results for input(s): AMMONIA in the last 168 hours. CBC: Recent Labs  Lab 02/18/21 0230 02/18/21 1420 02/19/21 0206 02/20/21 0310 02/21/21 1137 02/24/21 0313  WBC 3.1* 3.3* 5.3 7.0 9.7 14.4*  NEUTROABS 1.6*  --  4.1 5.2  --   --   HGB 10.8* 12.1 11.4* 10.8* 11.0* 11.0*  HCT 32.2* 35.7* 32.9* 32.1* 33.8* 33.6*  MCV 88.5 88.4 87.5 88.7 89.9 90.3  PLT 190 254 100* 93* 143* 134*   Cardiac Enzymes: No results for input(s): CKTOTAL, CKMB, CKMBINDEX, TROPONINI in the last 168 hours. CBG: No results for input(s): GLUCAP in the last 168 hours.  Iron Studies: No results for input(s): IRON, TIBC, TRANSFERRIN, FERRITIN in the last 72 hours. Studies/Results: CT HEAD WO CONTRAST  Result Date: 02/24/2021 CLINICAL DATA:  Delirium EXAM: CT HEAD WITHOUT CONTRAST TECHNIQUE: Contiguous axial images were obtained from the base of the skull through the vertex without intravenous contrast. COMPARISON:  01/27/2021 FINDINGS: Brain: There is no acute intracranial hemorrhage, mass effect, or edema. Gray-white differentiation is preserved. There is no extra-axial fluid collection. Ventricles and sulci are  within normal limits in size and configuration. Vascular: No hyperdense vessel or unexpected calcification. Skull: Calvarium is unremarkable. Sinuses/Orbits: No acute finding. Other: Partially empty sella. IMPRESSION: No acute intracranial abnormality. Electronically Signed   By: 01/29/2021 M.D.   On: 02/24/2021 15:59   IR Fluoro Guide CV Line Right  Result Date: 02/24/2021 CLINICAL DATA:  Renal failure and need for hemodialysis. The patient currently has a non tunneled temporary dialysis catheter via the right internal jugular vein and requires a tunneled dialysis catheter for longer-term hemodialysis. EXAM: TUNNELED CENTRAL VENOUS HEMODIALYSIS CATHETER PLACEMENT WITH ULTRASOUND AND FLUOROSCOPIC GUIDANCE ANESTHESIA/SEDATION: 1.0 mg IV Versed; 50 mcg IV Fentanyl. Total Moderate Sedation Time:   25 minutes. The patient's level of  consciousness and physiologic status were continuously monitored during the procedure by Radiology nursing. MEDICATIONS: 3 g IV Ancef. FLUOROSCOPY TIME:  24 seconds.  2.5 mGy. PROCEDURE: The procedure, risks, benefits, and alternatives were explained to the patient. Questions regarding the procedure were encouraged and answered. The patient understands and consents to the procedure. A timeout was performed prior to initiating the procedure. The pre-existing non tunneled right jugular temporary dialysis catheter was removed and hemostasis obtained with manual compression over the exit site. Ultrasound was used to confirm patency of the right internal jugular vein after non tunneled catheter removal. The right neck and chest were prepped with chlorhexidine in a sterile fashion, and a sterile drape was applied covering the operative field. Maximum barrier sterile technique with sterile gowns and gloves were used for the procedure. Local anesthesia was provided with 1% lidocaine. After creating a small venotomy incision, a 21 gauge needle was advanced into the right internal jugular vein  under direct, real-time ultrasound guidance. Ultrasound image documentation was performed. After securing guidewire access, an 8 Fr dilator was placed. A J-wire was kinked to measure appropriate catheter length. A Palindrome tunneled hemodialysis catheter measuring 19 cm from tip to cuff was chosen for placement. This was tunneled in a retrograde fashion from the chest wall to the venotomy incision. At the venotomy, serial dilatation was performed and a 15 Fr peel-away sheath was placed over a guidewire. The catheter was then placed through the sheath and the sheath removed. Final catheter positioning was confirmed and documented with a fluoroscopic spot image. The catheter was aspirated, flushed with saline, and injected with appropriate volume heparin dwells. The venotomy incision was closed with subcuticular 4-0 Vicryl. Dermabond was applied to the incision. The catheter exit site was secured with 0-Prolene retention sutures. COMPLICATIONS: None.  No pneumothorax. FINDINGS: After catheter placement, the tip lies in the right atrium. The catheter aspirates normally and is ready for immediate use. IMPRESSION: Placement of tunneled hemodialysis catheter via the right internal jugular vein. The catheter tip lies in the right atrium. The catheter is ready for immediate use. Electronically Signed   By: Irish Lack M.D.   On: 02/24/2021 12:01   IR US Guidance  Result Date: 02/24/2021 CLINICAL DATA:  Renal failure and need for hemodialysis. The patient currently has a non tunneled temporary dialysis catheter via the right internal jugular vein and requires a tunneled dialysis catheter for longer-term hemodialysis. EXAM: TUNNELED CENTRAL VENOUS HEMODIALYSIS CATHETER PLACEMENT WITH ULTRASOUND AND FLUOROSCOPIC GUIDANCE ANESTHESIA/SEDATION: 1.0 mg IV Versed; 50 mcg IV Fentanyl. Total Moderate Sedation Time:   25 minutes. The patient's level of consciousness and physiologic status were continuously monitored during  the procedure by Radiology nursing. MEDICATIONS: 3 g IV Ancef. FLUOROSCOPY TIME:  24 seconds.  2.5 mGy. PROCEDURE: The procedure, risks, benefits, and alternatives were explained to the patient. Questions regarding the procedure were encouraged and answered. The patient understands and consents to the procedure. A timeout was performed prior to initiating the procedure. The pre-existing non tunneled right jugular temporary dialysis catheter was removed and hemostasis obtained with manual compression over the exit site. Ultrasound was used to confirm patency of the right internal jugular vein after non tunneled catheter removal. The right neck and chest were prepped with chlorhexidine in a sterile fashion, and a sterile drape was applied covering the operative field. Maximum barrier sterile technique with sterile gowns and gloves were used for the procedure. Local anesthesia was provided with 1% lidocaine. After creating a small venotomy incision,  a 21 gauge needle was advanced into the right internal jugular vein under direct, real-time ultrasound guidance. Ultrasound image documentation was performed. After securing guidewire access, an 8 Fr dilator was placed. A J-wire was kinked to measure appropriate catheter length. A Palindrome tunneled hemodialysis catheter measuring 19 cm from tip to cuff was chosen for placement. This was tunneled in a retrograde fashion from the chest wall to the venotomy incision. At the venotomy, serial dilatation was performed and a 15 Fr peel-away sheath was placed over a guidewire. The catheter was then placed through the sheath and the sheath removed. Final catheter positioning was confirmed and documented with a fluoroscopic spot image. The catheter was aspirated, flushed with saline, and injected with appropriate volume heparin dwells. The venotomy incision was closed with subcuticular 4-0 Vicryl. Dermabond was applied to the incision. The catheter exit site was secured with  0-Prolene retention sutures. COMPLICATIONS: None.  No pneumothorax. FINDINGS: After catheter placement, the tip lies in the right atrium. The catheter aspirates normally and is ready for immediate use. IMPRESSION: Placement of tunneled hemodialysis catheter via the right internal jugular vein. The catheter tip lies in the right atrium. The catheter is ready for immediate use. Electronically Signed   By: Irish Lack M.D.   On: 02/24/2021 12:01    Medications: Infusions:   Scheduled Medications: . Chlorhexidine Gluconate Cloth  6 each Topical Q0600  . famotidine  10 mg Oral Daily  . feeding supplement (NEPRO CARB STEADY)  237 mL Oral TID BM  . heparin injection (subcutaneous)  5,000 Units Subcutaneous Q8H  . kidney failure book   Does not apply Once  . mycophenolate  250 mg Oral BID  . polyethylene glycol  17 g Oral Daily  . predniSONE  60 mg Oral Q breakfast   I have reviewed scheduled and prn medications.  Physical Exam: General:NAD, laying flat Heart:RRR, s1s2 nl Lungs:  no iwob Abdomen:soft, Non-tender, non-distended Extremities: Trace LE edema b/l Dialysis Access: Right tunneled HD catheter  Vikas Singh 02/24/2021,7:47 PM  LOS: 8 days

## 2021-02-24 NOTE — Procedures (Signed)
Interventional Radiology Procedure Note  Procedure: Right IJ tunneled HD catheter placement  Complications: None  Estimated Blood Loss: < 10 mL  Findings: Right IJ temp cath removed. Vein patent after removal. Via new access, 19 cm tip to cuff length Palindrome catheter placed with tip in RA. OK to use.  Jodi Marble. Fredia Sorrow, M.D Pager:  [phone]

## 2021-02-24 NOTE — Sedation Documentation (Signed)
Ancef 3g started at 1010 ivpb

## 2021-02-24 NOTE — Progress Notes (Signed)
Attempted to speak with patient again regarding outpatient HD referral. She is not available at this time and Navigator will follow up on Monday.  Barbee Shropshire, [place] Renal Navigator [phone]

## 2021-02-24 NOTE — Progress Notes (Signed)
PT Cancellation Note  Patient Details Name: Denise Watson MRN: [id number] DOB: [birth year]   Cancelled Treatment:      Attempted x 3 today.  Pt nauseated x 2 requesting PT to return later.  Checked [phone] and pt off floor for CT.  Will f/u as able. Anise Salvo, PT Acute Rehab Services Pager [phone] [facility] [phone]    Rayetta Hartmann 02/24/2021, 3:43 PM

## 2021-02-24 NOTE — Progress Notes (Signed)
Renal Navigator called in to patient's room and introduced self as "Outpatient Dialysis Coordinator." Patient's sister answered the phone and stated that patient "was nauseated and is currently sleeping." Navigator left message with name and number and asked that she have patient call when she wakes up. Navigator will try again between 3:00-3:30pm today if patient hasn't called, as we do not want HD seat referral to hold up discharge when patient is ready.   Barbee Shropshire, [place] Renal Navigator [phone]

## 2021-02-24 NOTE — Progress Notes (Signed)
Rounded on patient today in correlation to transition to outpatient HD. Patient was found sitting in her recliner with her eyes closed. This RN requested permission to talk to her about HD and she was agreeable. Ordered Kidney Failure book. Patient educated at the bedside regarding care of tunneled dialysis catheter, and proper medication administration on HD days.  Patient also educated on the importance of adhering to scheduled dialysis treatments, the effects of fluid overload, hyperkalemia and hyperphosphatemia. Patient appears overwhelmed with information and is ok with me returning Monday to do a recap of our discussion. Patient to CT. Patient with no further questions at this time. Handouts and contact information provided to patient for any further assistance. Will follow as appropriate.   Dondra Spry, RN  Dialysis Nurse Coordinator Phone: [phone]

## 2021-02-25 DIAGNOSIS — M329 Systemic lupus erythematosus, unspecified: Secondary | ICD-10-CM | POA: Diagnosis not present

## 2021-02-25 DIAGNOSIS — R7401 Elevation of levels of liver transaminase levels: Secondary | ICD-10-CM | POA: Diagnosis not present

## 2021-02-25 DIAGNOSIS — N179 Acute kidney failure, unspecified: Secondary | ICD-10-CM | POA: Diagnosis not present

## 2021-02-25 LAB — COMPREHENSIVE METABOLIC PANEL
ALT: 37 U/L (ref 0–44)
AST: 57 U/L — ABNORMAL HIGH (ref 15–41)
Albumin: 1.9 g/dL — ABNORMAL LOW (ref 3.5–5.0)
Alkaline Phosphatase: 67 U/L (ref 38–126)
Anion gap: 10 (ref 5–15)
BUN: 61 mg/dL — ABNORMAL HIGH (ref 6–20)
CO2: 26 mmol/L (ref 22–32)
Calcium: 8 mg/dL — ABNORMAL LOW (ref 8.9–10.3)
Chloride: 99 mmol/L (ref 98–111)
Creatinine, Ser: 9.76 mg/dL — ABNORMAL HIGH (ref 0.44–1.00)
GFR, Estimated: 4 mL/min — ABNORMAL LOW (ref >60)
Glucose, Bld: 121 mg/dL — ABNORMAL HIGH (ref 70–99)
Potassium: 4.8 mmol/L (ref 3.5–5.1)
Sodium: 135 mmol/L (ref 135–145)
Total Bilirubin: 0.4 mg/dL (ref 0.3–1.2)
Total Protein: 5.7 g/dL — ABNORMAL LOW (ref 6.5–8.1)

## 2021-02-25 LAB — AMMONIA: Ammonia: 14 umol/L (ref 9–35)

## 2021-02-25 LAB — CBC
HCT: 32 % — ABNORMAL LOW (ref 36.0–46.0)
Hemoglobin: 10.5 g/dL — ABNORMAL LOW (ref 12.0–15.0)
MCH: 30.1 pg (ref 26.0–34.0)
MCHC: 32.8 g/dL (ref 30.0–36.0)
MCV: 91.7 fL (ref 80.0–100.0)
Platelets: 125 10*3/uL — ABNORMAL LOW (ref 150–400)
RBC: 3.49 MIL/uL — ABNORMAL LOW (ref 3.87–5.11)
RDW: 14 % (ref 11.5–15.5)
WBC: 9.3 10*3/uL (ref 4.0–10.5)
nRBC: 0 % (ref 0.0–0.2)

## 2021-02-25 LAB — FOLATE: Folate: 8.8 ng/mL (ref >5.9)

## 2021-02-25 LAB — VITAMIN B12: Vitamin B-12: 2979 pg/mL — ABNORMAL HIGH (ref 180–914)

## 2021-02-25 MED ORDER — HEPARIN SODIUM (PORCINE) 1000 UNIT/ML IJ SOLN
INTRAMUSCULAR | Status: AC
  Filled 2021-02-25: qty 3

## 2021-02-25 MED ORDER — TRAZODONE HCL 50 MG PO TABS: 50.0000 mg | ORAL_TABLET | Freq: Every evening | ORAL | Status: DC | PRN

## 2021-02-25 NOTE — Progress Notes (Signed)
PROGRESS NOTE                                                                                                                                                                                                             Patient Demographics:    Denise Watson, is a 55 y.o. female, DOB - 17-Mar-1966, ZOX:[id number]  Outpatient Primary MD for the patient is Piedad Climes, [place]    LOS - 9  Admit date - 02/16/2021    Chief Complaint  Patient presents with   Shortness of Breath       Brief Narrative  -  Denise Watson  is a 55 y.o. female, who is in relatively good health except recent diagnosis of essential hypertension requiring HCTZ for the last few weeks, obesity, COVID-19 infection in January, psoriasis, C-section in the past, intermittent palpitations in the past.  Apparently patient has been having symptoms of sore throat for the last 2 weeks and subsequently has been developing gradual fatigue and some exertional shortness of breath, denies any fever chills, no chest or abdominal pain, no blood in stool or urine, no cough or sinus pressure, no swelling in arms or legs, she does have petechial rash but she says this is due to her psoriasis and is chronic.  She presented to the ER where she was found to have AKI with a creatinine of 10, nephrology was consulted and I was requested to admit the patient.  Currently review of systems positive for sore throat, fatigue, exertional shortness of breath, chronic petechial rash due to psoriasis according to the patient.  Other review of systems are negative   Subjective:   Patient reports some abdominal pain today, she denies any fever, chills, chest pain or shortness of breath, she reports generalized weakness, but nothing focal.     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 -Continue steroids, transition from IV Solu-Medrol to p.o. prednisone. 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 -TDC right IJ placed by IR 3/25. -started on cellcept -Patient with some lethargy initially, this appears to be gradually improving, CT head obtained 3/25 with no acute findings. -She is complaining of abdominal pain today, so I will proceed with CT abdomen pelvis without  contrast for further evaluation.  Generalized fatigue, exertional shortness of breath and pharyngitis.   - In a patient with AKI, she was recently diagnosed with hypertension and started with HCTZ as well.  Petechial rash.  - Patient claims this is chronic due to psoriasis but could be due to platelet dysfunction caused by AKI.  Will monitor.  Platelet counts are stable pressure stable.  Borderline hyperkalemia.  - Stable after kayaxalate and HD.  Exertional shortness of breath -  likely due to AKI, stable TTE. HD now for any extra fluid removal.    Obesity.  BMI 39.  Follow with PCP.  Hypertension.  Gentle IV hydralazine, will avoid hypotension due to AKI.  Pharyngitis.  Strep throat culture negative, strep urinary antigen negative, ASO titer negative, likely viral pharyngitis  History of psoriasis.  Question if she has discoid lupus.  Outpatient dermatology follow-up.  Asymptomatic mild rise in LFTs.  Would be due to hepatic congestion along with acute hepatitis panel negative, she is symptom-free.Stable RUQ [place] with incidental gallbladder polyp, outpatient follow-up with PCP.  LFTs trending down.       Condition -   Guarded  Family Communication  : Discussed with sister at bedside  Code Status :  Full  Consults  :  Renal  PUD Prophylaxis :    Procedures  :     RUQ US - GB Polyp  R. Kidney biopsy 02/20/21  R.IJ HD Cath by IR 02/18/21  Renal US - medical renal disease.  TTE - 1. Left ventricular ejection fraction, by estimation, is 60%.  The left ventricle has normal function. The left ventricle has no regional wall motion abnormalities. There is mild left ventricular hypertrophy. Left ventricular diastolic parameters were low normal.  2. Right ventricular systolic function is normal. The right ventricular size is normal. Tricuspid regurgitation signal is inadequate for assessing PA pressure.  3. The mitral valve is normal in structure. Trivial mitral valve regurgitation. No evidence of mitral stenosis.  4. The aortic valve is tricuspid. Aortic valve regurgitation is not visualized. No aortic stenosis is present.  5. The inferior vena cava is normal in size with greater than 50% respiratory variability, suggesting right atrial pressure of 3 mmHg.       Disposition Plan  :    Status is: Inpatient  Remains inpatient appropriate because:IV treatments appropriate due to intensity of illness or inability to take PO   Dispo: The patient is from: Home              Anticipated d/c is to: Home              Patient currently is not medically stable to d/c.   Difficult to place patient No  DVT Prophylaxis  :  Heparin   Lab Results  Component Value Date   PLT 125 (L) 02/25/2021    Diet :  Diet Order            Diet renal/carb modified with fluid restriction Diet-HS Snack? Nothing; Fluid restriction: Other (see comments); Room service appropriate? Yes; Fluid consistency: Thin  Diet effective now                  Inpatient Medications  Scheduled Meds:  Chlorhexidine Gluconate Cloth  6 each Topical Q0600   famotidine  10 mg Oral Daily   feeding supplement (NEPRO CARB STEADY)  237 mL Oral TID BM   heparin injection (subcutaneous)  5,000 Units Subcutaneous Q8H   kidney failure book  Does not apply Once   mycophenolate  250 mg Oral BID   polyethylene glycol  17 g Oral Daily   predniSONE  60 mg Oral Q breakfast   Continuous Infusions:  PRN Meds:.acetaminophen **OR** [DISCONTINUED] acetaminophen, alum & mag  hydroxide-simeth, bisacodyl, docusate sodium, hydrALAZINE, [DISCONTINUED] ondansetron **OR** ondansetron (ZOFRAN) IV, ondansetron, phenol, sodium chloride flush  Antibiotics  :    Anti-infectives (From admission, onward)   Start     Dose/Rate Route Frequency Ordered Stop   02/24/21 1400  ceFAZolin (ANCEF) 3 g in dextrose 5 % 50 mL IVPB  Status:  Discontinued        3 g 100 mL/hr over 30 Minutes Intravenous To Radiology 02/23/21 1350 02/24/21 1036   02/24/21 1045  ceFAZolin (ANCEF) 3 g in dextrose 5 % 50 mL IVPB        3 g 100 mL/hr over 30 Minutes Intravenous To Radiology 02/24/21 1036 02/24/21 1040       Lake Cinquemani M.D on 02/25/2021 at 2:08 PM  To page go to [email or web address]   [facility] -  Office  [phone]   See all Orders from today for further details    Objective:   Vitals:   02/25/21 1305 02/25/21 1310 02/25/21 1335 02/25/21 1350  BP: (!) 144/88 (!) 144/88 128/82 120/81  Pulse: 82     Resp:  15 13 14   Temp:      TempSrc:      SpO2:      Weight:      Height:        Wt Readings from Last 3 Encounters:  02/25/21 131.7 kg  01/27/21 108.9 kg  06/27/16 132 kg    No intake or output data in the 24 hours ending 02/25/21 1408   Physical Exam  More awake and alert today, she is appropriate. Symmetrical Chest wall movement, Good air movement bilaterally, CTAB RRR,No Gallops,Rubs or new Murmurs, No Parasternal Heave +ve B.Sounds, he has some mild diffuse tenderness, nonspecific, no flank or CVA tenderness.   No Cyanosis, Clubbing or edema.    Data Review:    CBC Recent Labs  Lab 02/19/21 0206 02/20/21 0310 02/21/21 1137 02/24/21 0313 02/25/21 0359  WBC 5.3 7.0 9.7 14.4* 9.3  HGB 11.4* 10.8* 11.0* 11.0* 10.5*  HCT 32.9* 32.1* 33.8* 33.6* 32.0*  PLT 100* 93* 143* 134* 125*  MCV 87.5 88.7 89.9 90.3 91.7  MCH 30.3 29.8 29.3 29.6 30.1  MCHC 34.7 33.6 32.5 32.7 32.8  RDW 13.6 13.6 13.8 13.8 14.0  LYMPHSABS 1.0 1.1  --   --   --   MONOABS  0.2 0.6  --   --   --   EOSABS 0.0 0.0  --   --   --   BASOSABS 0.0 0.0  --   --   --     Recent Labs  Lab 02/19/21 0206 02/20/21 0310 02/21/21 1137 02/22/21 0601 02/23/21 0357 02/24/21 0313 02/25/21 0359  NA 131* 130* 131*  --  130* 134* 135  K 4.5 4.5 4.8  --  5.0 4.8 4.8  CL 97* 97* 97*  --  96* 98 99  CO2 20* 22 20*  --  23 26 26   GLUCOSE 139* 152* 144*  --  171* 145* 121*  BUN 81* 71* 98*  --  73* 47* 61*  CREATININE 9.57* 9.47* 11.78*  --  10.72* 8.55* 9.76*  CALCIUM 7.1* 7.7* 7.9*  --  7.6* 7.7* 8.0*  AST 110* 163*  --  158* 106* 79* 57*  ALT 55* 81*  --  116* 94* 76* 37  ALKPHOS 74 78  --  81 75 74 67  BILITOT 0.5 0.6  --  0.7 0.6 0.7 0.4  ALBUMIN 1.3* 1.6* 1.8* 2.0* 1.9* 2.0*   1.9* 1.9*  MG 2.0 2.2  --   --   --   --   --   AMMONIA  --   --   --   --   --   --  14  BNP 23.5 34.5  --   --   --   --   --     ------------------------------------------------------------------------------------------------------------------ No results for input(s): CHOL, HDL, LDLCALC, TRIG, CHOLHDL, LDLDIRECT in the last 72 hours.  Lab Results  Component Value Date   HGBA1C 6.3 (H) 02/16/2021   ------------------------------------------------------------------------------------------------------------------ No results for input(s): TSH, T4TOTAL, T3FREE, THYROIDAB in the last 72 hours.  Invalid input(s): FREET3  Cardiac Enzymes No results for input(s): CKMB, TROPONINI, MYOGLOBIN in the last 168 hours.  Invalid input(s): CK ------------------------------------------------------------------------------------------------------------------    Component Value Date/Time   BNP 34.5 02/20/2021 0310    Micro Results Recent Results (from the past 240 hour(s))  SARS CORONAVIRUS 2 (TAT 6-24 HRS) Nasopharyngeal Nasopharyngeal Swab     Status: None   Collection Time: 02/16/21  4:25 PM   Specimen: Nasopharyngeal Swab  Result Value Ref Range Status   SARS Coronavirus 2 NEGATIVE  NEGATIVE Final    Comment: (NOTE) SARS-CoV-2 target nucleic acids are NOT DETECTED.  The SARS-CoV-2 RNA is generally detectable in upper and lower respiratory specimens during the acute phase of infection. Negative results do not preclude SARS-CoV-2 infection, do not rule out co-infections with other pathogens, and should not be used as the sole basis for treatment or other patient management decisions. Negative results must be combined with clinical observations, patient history, and epidemiological information. The expected result is Negative.  Fact Sheet for Patients: HairSlick.no  Fact Sheet for Healthcare Providers: quierodirigir.com  This test is not yet approved or cleared by the [place] FDA and  has been authorized for detection and/or diagnosis of SARS-CoV-2 by FDA under an Emergency Use Authorization (EUA). This EUA will remain  in effect (meaning this test can be used) for the duration of the COVID-19 declaration under Se ction 564(b)(1) of the Act, 21 U.S.C. section 360bbb-3(b)(1), unless the authorization is terminated or revoked sooner.  Performed at [facility], [address] [place], [place] [address]   Respiratory (~20 pathogens) panel by PCR     Status: None   Collection Time: 02/16/21  5:06 PM   Specimen: Nasopharyngeal Swab; Respiratory  Result Value Ref Range Status   Adenovirus NOT DETECTED NOT DETECTED Final   Coronavirus 229E NOT DETECTED NOT DETECTED Final    Comment: (NOTE) The Coronavirus on the Respiratory Panel, DOES NOT test for the novel  Coronavirus (2019 nCoV)    Coronavirus HKU1 NOT DETECTED NOT DETECTED Final   Coronavirus NL63 NOT DETECTED NOT DETECTED Final   Coronavirus OC43 NOT DETECTED NOT DETECTED Final   Metapneumovirus NOT DETECTED NOT DETECTED Final   Rhinovirus / Enterovirus NOT DETECTED NOT DETECTED Final   Influenza A NOT DETECTED NOT DETECTED Final    Influenza B NOT DETECTED NOT DETECTED Final   Parainfluenza Virus 1 NOT DETECTED NOT DETECTED Final   Parainfluenza Virus 2 NOT DETECTED NOT DETECTED Final   Parainfluenza Virus 3 NOT DETECTED NOT DETECTED Final   Parainfluenza Virus 4 NOT  DETECTED NOT DETECTED Final   Respiratory Syncytial Virus NOT DETECTED NOT DETECTED Final   Bordetella pertussis NOT DETECTED NOT DETECTED Final   Bordetella Parapertussis NOT DETECTED NOT DETECTED Final   Chlamydophila pneumoniae NOT DETECTED NOT DETECTED Final   Mycoplasma pneumoniae NOT DETECTED NOT DETECTED Final    Comment: Performed at [facility], [address] [place], [place] [address]  Culture, group A strep     Status: None   Collection Time: 02/17/21 11:24 AM   Specimen: Throat  Result Value Ref Range Status   Specimen Description THROAT  Final   Special Requests Normal  Final   Culture   Final    NO GROUP A STREP (S.PYOGENES) ISOLATED Performed at [facility], [address] [place], [place] [address]    Report Status 02/19/2021 FINAL  Final    Radiology Reports CT ABDOMEN PELVIS WO CONTRAST  Result Date: 02/16/2021 CLINICAL DATA:  Abdominal pain. EXAM: CT ABDOMEN AND PELVIS WITHOUT CONTRAST TECHNIQUE: Multidetector CT imaging of the abdomen and pelvis was performed following the standard protocol without IV contrast. COMPARISON:  None. FINDINGS: Lower chest: No acute abnormality. Hepatobiliary: No focal liver abnormality is seen. No gallstones, gallbladder wall thickening, or biliary dilatation. Pancreas: Unremarkable. No pancreatic ductal dilatation or surrounding inflammatory changes. Spleen: Normal in size without focal abnormality. Adrenals/Urinary Tract: Adrenal glands appear normal. No hydronephrosis or renal obstruction is noted. No renal or ureteral calculi are noted. Urinary bladder is decompressed secondary to Foley catheter. Minimal inflammatory changes are noted around the right kidney suggesting possible  pyelonephritis. Stomach/Bowel: Stomach is within normal limits. Appendix appears normal. No evidence of bowel wall thickening, distention, or inflammatory changes. Vascular/Lymphatic: No significant vascular findings are present. No enlarged abdominal or pelvic lymph nodes. Reproductive: No adnexal abnormality is noted. Probable small exophytic uterine fibroid is noted arising posteriorly from the uterus. Other: No abdominal wall hernia or abnormality. No abdominopelvic ascites. Musculoskeletal: No acute or significant osseous findings. IMPRESSION: 1. Minimal inflammatory changes are noted around the right kidney suggesting possible pyelonephritis. 2. No hydronephrosis or renal obstruction is noted. No renal or ureteral calculi are noted. 3. Probable small exophytic uterine fibroid is noted arising posteriorly from the uterus. Electronically Signed   By: Lupita Raider M.D.   On: 02/16/2021 18:32   CT Angio Head W or Wo Contrast  Result Date: 01/27/2021 CLINICAL DATA:  Diplopia.  Dural sinus thrombosis suspected. EXAM: CT ANGIOGRAPHY HEAD CT VENOGRAM HEAD TECHNIQUE: Multidetector CT imaging of the head was performed using the standard protocol during bolus administration of intravenous contrast. Multiplanar CT image reconstructions and MIPs were obtained to evaluate the arterial anatomy. Additional delayed multidetector CT imaging of the head was performed with multiplanar CT image reconstructions and MIPS to evaluate the venous anatomy. CONTRAST:  OMNIPAQUE IOHEXOL 350 MG/ML SOLN COMPARISON:  None. FINDINGS: CT HEAD Brain: No evidence of acute large vascular territory infarction, acute hemorrhage, hydrocephalus, extra-axial collection or mass lesion/mass effect. Mild narrowing of the mamillopontine distance and effacement of the suprasellar cistern with trace tonsillar ectopia. Skull: No acute fracture. Sinuses: Visualized sinuses are clear. Orbits: No acute finding. CTA HEAD Due to venous timing,  significantly limited evaluation. The intradural left vertebral artery is poorly opacified, which could relate to stenosis or non dominant vertebral artery. Otherwise, no evidence of proximal large vessel occlusion or large aneurysm in the anterior or posterior circulation. CT VENOGRAM No evidence of dural sinus thrombosis. Small left transverse sinus and narrowing  of the distal right transverse sinus. Small rounded filling defect in the distal right transverse sinus likely represents arachnoid granulation. IMPRESSION: CT head: 1. No definite evidence of acute intracranial abnormality. 2. Mild narrowing of the mamillopontine distance and effacement of the suprasellar cistern with trace tonsillar ectopia. Given the patient is young and has relative paucity of CSF spaces/sulci diffusely these findings may be within normal limits, but recommend correlation with signs/symptoms of intracranial hypotension including positional headaches. Additionally, per Dr. Rush Landmark the patient is going to get a follow-up MRI which can further evaluate these findings. CTA head: Due to venous timing, significantly limited evaluation. The intradural left vertebral artery is not well opacified, which is poorly evaluated and could relate to nondominant vertebral artery or stenosis. A CTA neck could further evaluate if clinically indicated. Otherwise, no evidence of proximal large vessel occlusion or large aneurysm. A CTA of the neck could further evaluate the vertebral finding and a repeat CTA head could provide more sensitive intracranial arterial evaluation if clinically indicated. CTV: No evidence of dural sinus thrombosis. Findings discussed with Dr. Julieanne Manson at 3:05 PM via telephone. Electronically Signed   By: Feliberto Harts MD   On: 01/27/2021 15:15   DG Chest 2 View  Result Date: 02/16/2021 CLINICAL DATA:  Shortness of breath. EXAM: CHEST - 2 VIEW COMPARISON:  January 27, 2021. FINDINGS: The heart size and mediastinal contours  are within normal limits. Both lungs are clear. No pneumothorax or pleural effusion is noted. The visualized skeletal structures are unremarkable. IMPRESSION: No active cardiopulmonary disease. Electronically Signed   By: Lupita Raider M.D.   On: 02/16/2021 12:53   CT HEAD WO CONTRAST  Result Date: 02/24/2021 CLINICAL DATA:  Delirium EXAM: CT HEAD WITHOUT CONTRAST TECHNIQUE: Contiguous axial images were obtained from the base of the skull through the vertex without intravenous contrast. COMPARISON:  01/27/2021 FINDINGS: Brain: There is no acute intracranial hemorrhage, mass effect, or edema. Gray-white differentiation is preserved. There is no extra-axial fluid collection. Ventricles and sulci are within normal limits in size and configuration. Vascular: No hyperdense vessel or unexpected calcification. Skull: Calvarium is unremarkable. Sinuses/Orbits: No acute finding. Other: Partially empty sella. IMPRESSION: No acute intracranial abnormality. Electronically Signed   By: Guadlupe Spanish M.D.   On: 02/24/2021 15:59   US RENAL  Result Date: 02/21/2021 CLINICAL DATA:  Acute renal insufficiency EXAM: RENAL / URINARY TRACT ULTRASOUND COMPLETE COMPARISON:  02/20/2021 right upper quadrant ultrasound. Renal ultrasound 02/16/2021. FINDINGS: Right Kidney: Renal measurements: 12.3 by 6.0 x 5.5 cm = volume: 213 mL. Increased renal echogenicity. No hydronephrosis. Left Kidney: Renal measurements: 10.0 x 5.3 x 4.4 cm = volume: 128 mL. Increased renal echogenicity. No hydronephrosis. Bladder: Collapsed around a Foley catheter. Other: None. IMPRESSION: No hydronephrosis. Increased renal echogenicity, suggesting medical renal disease. Electronically Signed   By: Jeronimo Greaves M.D.   On: 02/21/2021 21:36   US Renal  Result Date: 02/16/2021 CLINICAL DATA:  Renal failure EXAM: RENAL / URINARY TRACT ULTRASOUND COMPLETE COMPARISON:  CT 02/16/2021 FINDINGS: Right Kidney: Renal measurements: 11.9 x 5.8 x 4.4 cm = volume:  159.2 mL. Cortex is echogenic. No mass or hydronephrosis. Trace perinephric fluid Left Kidney: Renal measurements: 9.7 x 5.2 x 4.9 cm = volume: 129.5 mL. Cortex is echogenic. No mass or hydronephrosis. Bladder: Empty by Foley catheter. Other: None. IMPRESSION: Echogenic kidneys bilaterally consistent with medical renal disease. No hydronephrosis Electronically Signed   By: Jasmine Pang M.D.   On: 02/16/2021 20:23   IR  Fluoro Guide CV Line Right  Result Date: 02/24/2021 CLINICAL DATA:  Renal failure and need for hemodialysis. The patient currently has a non tunneled temporary dialysis catheter via the right internal jugular vein and requires a tunneled dialysis catheter for longer-term hemodialysis. EXAM: TUNNELED CENTRAL VENOUS HEMODIALYSIS CATHETER PLACEMENT WITH ULTRASOUND AND FLUOROSCOPIC GUIDANCE ANESTHESIA/SEDATION: 1.0 mg IV Versed; 50 mcg IV Fentanyl. Total Moderate Sedation Time:   25 minutes. The patient's level of consciousness and physiologic status were continuously monitored during the procedure by Radiology nursing. MEDICATIONS: 3 g IV Ancef. FLUOROSCOPY TIME:  24 seconds.  2.5 mGy. PROCEDURE: The procedure, risks, benefits, and alternatives were explained to the patient. Questions regarding the procedure were encouraged and answered. The patient understands and consents to the procedure. A timeout was performed prior to initiating the procedure. The pre-existing non tunneled right jugular temporary dialysis catheter was removed and hemostasis obtained with manual compression over the exit site. Ultrasound was used to confirm patency of the right internal jugular vein after non tunneled catheter removal. The right neck and chest were prepped with chlorhexidine in a sterile fashion, and a sterile drape was applied covering the operative field. Maximum barrier sterile technique with sterile gowns and gloves were used for the procedure. Local anesthesia was provided with 1% lidocaine. After creating  a small venotomy incision, a 21 gauge needle was advanced into the right internal jugular vein under direct, real-time ultrasound guidance. Ultrasound image documentation was performed. After securing guidewire access, an 8 Fr dilator was placed. A J-wire was kinked to measure appropriate catheter length. A Palindrome tunneled hemodialysis catheter measuring 19 cm from tip to cuff was chosen for placement. This was tunneled in a retrograde fashion from the chest wall to the venotomy incision. At the venotomy, serial dilatation was performed and a 15 Fr peel-away sheath was placed over a guidewire. The catheter was then placed through the sheath and the sheath removed. Final catheter positioning was confirmed and documented with a fluoroscopic spot image. The catheter was aspirated, flushed with saline, and injected with appropriate volume heparin dwells. The venotomy incision was closed with subcuticular 4-0 Vicryl. Dermabond was applied to the incision. The catheter exit site was secured with 0-Prolene retention sutures. COMPLICATIONS: None.  No pneumothorax. FINDINGS: After catheter placement, the tip lies in the right atrium. The catheter aspirates normally and is ready for immediate use. IMPRESSION: Placement of tunneled hemodialysis catheter via the right internal jugular vein. The catheter tip lies in the right atrium. The catheter is ready for immediate use. Electronically Signed   By: Irish Lack M.D.   On: 02/24/2021 12:01   IR Fluoro Guide CV Line Right  Result Date: 02/18/2021 INDICATION: 55 year old female referred for temporary hemodialysis catheter placement EXAM: IMAGE GUIDED PLACEMENT OF TEMPORARY HEMODIALYSIS CATHETER MEDICATIONS: None ANESTHESIA/SEDATION: Moderate (conscious) sedation was employed during this procedure. A total of Versed 1.0 mg and Fentanyl 50 mcg was administered intravenously. Moderate Sedation Time: 13 minutes. The patient's level of consciousness and vital signs were  monitored continuously by radiology nursing throughout the procedure under my direct supervision. FLUOROSCOPY TIME:  Fluoroscopy Time: 0 minutes 6 seconds (1 mGy). COMPLICATIONS: None PROCEDURE: Informed written consent was obtained from the patient and the patient's family after a thorough discussion of the procedural risks, benefits and alternatives. All questions were addressed. A timeout was performed prior to the initiation of the procedure. The right neck and chest was prepped with chlorhexidine, and draped in the usual sterile fashion using maximum barrier technique (  cap and mask, sterile gown, sterile gloves, large sterile sheet, hand hygiene and cutaneous antiseptic). Local anesthesia was attained by infiltration with 1% lidocaine without epinephrine. Ultrasound demonstrated patency of the right internal jugular vein, and this was documented with an image. Under real-time ultrasound guidance, this vein was accessed with a 21 gauge micropuncture needle and image documentation was performed. A small dermatotomy was made at the access site with an 11 scalpel. A 0.018" wire was advanced into the SVC and the access needle exchanged for a 26F micropuncture vascular sheath. The 0.018" wire was then removed and a 0.035" wire advanced into the IVC. Upon withdrawal of the 018 wire, the wire was marked for appropriate length of the internal portion of the catheter. A 16 cm catheter was selected. Skin and subcutaneous tissues were serially dilated. Catheter was placed on the wire. The catheter tip is positioned in the upper right atrium. This was documented with a spot image. Both ports of the hemodialysis catheter were then tested for excellent function. The ports were then locked with heparinized lock. Patient tolerated the procedure well and remained hemodynamically stable throughout. No complications were encountered and no significant blood loss was encountered. IMPRESSION: Status post image guided placement of  temporary hemodialysis catheter. Catheter may be converted if needed. Signed, Yvone Neu. Reyne Dumas, RPVI [facility] Electronically Signed   By: Gilmer Mor D.O.   On: 02/18/2021 14:21   IR US Guide Vasc Access Right  Result Date: 02/18/2021 INDICATION: 55 year old female referred for temporary hemodialysis catheter placement EXAM: IMAGE GUIDED PLACEMENT OF TEMPORARY HEMODIALYSIS CATHETER MEDICATIONS: None ANESTHESIA/SEDATION: Moderate (conscious) sedation was employed during this procedure. A total of Versed 1.0 mg and Fentanyl 50 mcg was administered intravenously. Moderate Sedation Time: 13 minutes. The patient's level of consciousness and vital signs were monitored continuously by radiology nursing throughout the procedure under my direct supervision. FLUOROSCOPY TIME:  Fluoroscopy Time: 0 minutes 6 seconds (1 mGy). COMPLICATIONS: None PROCEDURE: Informed written consent was obtained from the patient and the patient's family after a thorough discussion of the procedural risks, benefits and alternatives. All questions were addressed. A timeout was performed prior to the initiation of the procedure. The right neck and chest was prepped with chlorhexidine, and draped in the usual sterile fashion using maximum barrier technique (cap and mask, sterile gown, sterile gloves, large sterile sheet, hand hygiene and cutaneous antiseptic). Local anesthesia was attained by infiltration with 1% lidocaine without epinephrine. Ultrasound demonstrated patency of the right internal jugular vein, and this was documented with an image. Under real-time ultrasound guidance, this vein was accessed with a 21 gauge micropuncture needle and image documentation was performed. A small dermatotomy was made at the access site with an 11 scalpel. A 0.018" wire was advanced into the SVC and the access needle exchanged for a 26F micropuncture vascular sheath. The 0.018" wire was  then removed and a 0.035" wire advanced into the IVC. Upon withdrawal of the 018 wire, the wire was marked for appropriate length of the internal portion of the catheter. A 16 cm catheter was selected. Skin and subcutaneous tissues were serially dilated. Catheter was placed on the wire. The catheter tip is positioned in the upper right atrium. This was documented with a spot image. Both ports of the hemodialysis catheter were then tested for excellent function. The ports were then locked with heparinized lock. Patient tolerated the procedure well and remained hemodynamically stable throughout. No complications were encountered and no significant  blood loss was encountered. IMPRESSION: Status post image guided placement of temporary hemodialysis catheter. Catheter may be converted if needed. Signed, Yvone NeuJaime S. Reyne DumasWagner, DO, RPVI [facility] Electronically Signed   By: Gilmer MorJaime  Wagner D.O.   On: 02/18/2021 14:21   IR US Guidance  Result Date: 02/24/2021 CLINICAL DATA:  Renal failure and need for hemodialysis. The patient currently has a non tunneled temporary dialysis catheter via the right internal jugular vein and requires a tunneled dialysis catheter for longer-term hemodialysis. EXAM: TUNNELED CENTRAL VENOUS HEMODIALYSIS CATHETER PLACEMENT WITH ULTRASOUND AND FLUOROSCOPIC GUIDANCE ANESTHESIA/SEDATION: 1.0 mg IV Versed; 50 mcg IV Fentanyl. Total Moderate Sedation Time:   25 minutes. The patient's level of consciousness and physiologic status were continuously monitored during the procedure by Radiology nursing. MEDICATIONS: 3 g IV Ancef. FLUOROSCOPY TIME:  24 seconds.  2.5 mGy. PROCEDURE: The procedure, risks, benefits, and alternatives were explained to the patient. Questions regarding the procedure were encouraged and answered. The patient understands and consents to the procedure. A timeout was performed prior to initiating the procedure. The pre-existing  non tunneled right jugular temporary dialysis catheter was removed and hemostasis obtained with manual compression over the exit site. Ultrasound was used to confirm patency of the right internal jugular vein after non tunneled catheter removal. The right neck and chest were prepped with chlorhexidine in a sterile fashion, and a sterile drape was applied covering the operative field. Maximum barrier sterile technique with sterile gowns and gloves were used for the procedure. Local anesthesia was provided with 1% lidocaine. After creating a small venotomy incision, a 21 gauge needle was advanced into the right internal jugular vein under direct, real-time ultrasound guidance. Ultrasound image documentation was performed. After securing guidewire access, an 8 Fr dilator was placed. A J-wire was kinked to measure appropriate catheter length. A Palindrome tunneled hemodialysis catheter measuring 19 cm from tip to cuff was chosen for placement. This was tunneled in a retrograde fashion from the chest wall to the venotomy incision. At the venotomy, serial dilatation was performed and a 15 Fr peel-away sheath was placed over a guidewire. The catheter was then placed through the sheath and the sheath removed. Final catheter positioning was confirmed and documented with a fluoroscopic spot image. The catheter was aspirated, flushed with saline, and injected with appropriate volume heparin dwells. The venotomy incision was closed with subcuticular 4-0 Vicryl. Dermabond was applied to the incision. The catheter exit site was secured with 0-Prolene retention sutures. COMPLICATIONS: None.  No pneumothorax. FINDINGS: After catheter placement, the tip lies in the right atrium. The catheter aspirates normally and is ready for immediate use. IMPRESSION: Placement of tunneled hemodialysis catheter via the right internal jugular vein. The catheter tip lies in the right atrium. The catheter is ready for immediate use. Electronically  Signed   By: Irish LackGlenn  Yamagata M.D.   On: 02/24/2021 12:01   DG Chest Portable 1 View  Result Date: 01/27/2021 CLINICAL DATA:  Persistent cough. Recurrent upper respiratory infection EXAM: PORTABLE CHEST 1 VIEW COMPARISON:  Radiograph 06/27/2016 FINDINGS: 1355 hours. The heart size and mediastinal contours are normal. The lungs are clear. There is no pleural effusion or pneumothorax. No acute osseous findings are identified. Telemetry leads overlie the chest. IMPRESSION: No active cardiopulmonary process. Electronically Signed   By: Carey BullocksWilliam  Veazey M.D.   On: 01/27/2021 14:47   ECHOCARDIOGRAM COMPLETE  Result Date: 02/17/2021    ECHOCARDIOGRAM REPORT   Patient Name:   Delaney MeigsAMARA Dowis Date of  Exam: 02/17/2021 Medical Rec #:  [id number]       Height:       65.0 in Accession #:    [id number]      Weight:       240.0 lb Date of Birth:  June 26, 1966        BSA:          2.138 m Patient Age:    54 years        BP:           120/60 mmHg Patient Gender: F               HR:           67 bpm. Exam Location:  Inpatient Procedure: 2D Echo Indications:    CHF-Acute Diastolic I50.31  History:        Patient has no prior history of Echocardiogram examinations.  Sonographer:    Thurman Coyer RDCS (AE) Referring Phys: [id number] [facility] IMPRESSIONS  1. Left ventricular ejection fraction, by estimation, is 60%. The left ventricle has normal function. The left ventricle has no regional wall motion abnormalities. There is mild left ventricular hypertrophy. Left ventricular diastolic parameters were low normal.  2. Right ventricular systolic function is normal. The right ventricular size is normal. Tricuspid regurgitation signal is inadequate for assessing PA pressure.  3. The mitral valve is normal in structure. Trivial mitral valve regurgitation. No evidence of mitral stenosis.  4. The aortic valve is tricuspid. Aortic valve regurgitation is not visualized. No aortic stenosis is present.  5. The inferior vena cava is normal in  size with greater than 50% respiratory variability, suggesting right atrial pressure of 3 mmHg. FINDINGS  Left Ventricle: Left ventricular ejection fraction, by estimation, is 60%. The left ventricle has normal function. The left ventricle has no regional wall motion abnormalities. The left ventricular internal cavity size was normal in size. There is mild left ventricular hypertrophy. Left ventricular diastolic parameters were normal. Right Ventricle: The right ventricular size is normal. No increase in right ventricular wall thickness. Right ventricular systolic function is normal. Tricuspid regurgitation signal is inadequate for assessing PA pressure. Left Atrium: Left atrial size was normal in size. Right Atrium: Right atrial size was normal in size. Pericardium: There is no evidence of pericardial effusion. Mitral Valve: The mitral valve is normal in structure. Trivial mitral valve regurgitation. No evidence of mitral valve stenosis. Tricuspid Valve: The tricuspid valve is normal in structure. Tricuspid valve regurgitation is trivial. No evidence of tricuspid stenosis. Aortic Valve: The aortic valve is tricuspid. Aortic valve regurgitation is not visualized. No aortic stenosis is present. Pulmonic Valve: The pulmonic valve was normal in structure. Pulmonic valve regurgitation is trivial. No evidence of pulmonic stenosis. Aorta: The aortic root is normal in size and structure. Venous: The inferior vena cava is normal in size with greater than 50% respiratory variability, suggesting right atrial pressure of 3 mmHg. IAS/Shunts: The interatrial septum was not well visualized.  LEFT VENTRICLE PLAX 2D LVIDd:         4.10 cm  Diastology LVIDs:         2.80 cm  LV e' medial:    6.53 cm/s LV PW:         1.20 cm  LV E/e' medial:  11.0 LV IVS:        1.20 cm  LV e' lateral:   9.57 cm/s LVOT diam:     2.10 cm  LV E/e' lateral: 7.5 LV  SV:         76 LV SV Index:   36 LVOT Area:     3.46 cm  RIGHT VENTRICLE RV S prime:      15.40 cm/s TAPSE (M-mode): 1.4 cm LEFT ATRIUM             Index       RIGHT ATRIUM          Index LA diam:        3.10 cm 1.45 cm/m  RA Area:     9.08 cm LA Vol (A2C):   27.6 ml 12.91 ml/m RA Volume:   15.80 ml 7.39 ml/m LA Vol (A4C):   39.6 ml 18.53 ml/m LA Biplane Vol: 35.3 ml 16.51 ml/m  AORTIC VALVE LVOT Vmax:   107.00 cm/s LVOT Vmean:  64.800 cm/s LVOT VTI:    0.220 m  AORTA Ao Root diam: 2.80 cm MITRAL VALVE MV Area (PHT): 2.42 cm    SHUNTS MV Decel Time: 313 msec    Systemic VTI:  0.22 m MV E velocity: 71.60 cm/s  Systemic Diam: 2.10 cm MV A velocity: 73.30 cm/s MV E/A ratio:  0.98 Weston Brass MD Electronically signed by Weston Brass MD Signature Date/Time: 02/17/2021/1:13:33 PM    Final    US BIOPSY (KIDNEY)  Result Date: 02/20/2021 INDICATION: 23 year old with acute kidney injury and request for renal biopsy. EXAM: ULTRASOUND-GUIDED RANDOM RENAL BIOPSY MEDICATIONS: Moderate sedation none. ANESTHESIA/SEDATION: Moderate (conscious) sedation was employed during this procedure. A total of Versed 1.0 mg and Fentanyl 50 mcg was administered intravenously. Moderate Sedation Time: 15 minutes. The patient's level of consciousness and vital signs were monitored continuously by radiology nursing throughout the procedure under my direct supervision. FLUOROSCOPY TIME:  None COMPLICATIONS: None immediate. PROCEDURE: Informed written consent was obtained from the patient after a thorough discussion of the procedural risks, benefits and alternatives. All questions were addressed. A timeout was performed prior to the initiation of the procedure. Patient was placed prone. Both kidneys were evaluated with ultrasound. The right kidney was targeted for biopsy. The right flank was prepped with chlorhexidine and sterile field was created. Maximal barrier sterile technique was utilized including caps, mask, sterile gowns, sterile gloves, sterile drape, hand hygiene and skin antiseptic. Skin was anesthetized using  1% lidocaine. A small incision was made. Using ultrasound guidance, 16 gauge core biopsy was directed into the right kidney lower pole. Adequate specimen was obtained and placed in saline. A second ultrasound-guided core biopsy was obtained from the right kidney lower pole. Adequate specimen was obtained and placed in saline. Bandage placed over the puncture site. FINDINGS: Negative for hydronephrosis. Two core biopsies obtained from the right kidney lower pole. No significant bleeding or hematoma formation. IMPRESSION: Successful ultrasound-guided random core biopsies from the right kidney lower pole. Electronically Signed   By: Richarda Overlie M.D.   On: 02/20/2021 17:20   CT VENOGRAM HEAD  Result Date: 01/27/2021 CLINICAL DATA:  Diplopia.  Dural sinus thrombosis suspected. EXAM: CT ANGIOGRAPHY HEAD CT VENOGRAM HEAD TECHNIQUE: Multidetector CT imaging of the head was performed using the standard protocol during bolus administration of intravenous contrast. Multiplanar CT image reconstructions and MIPs were obtained to evaluate the arterial anatomy. Additional delayed multidetector CT imaging of the head was performed with multiplanar CT image reconstructions and MIPS to evaluate the venous anatomy. CONTRAST:  OMNIPAQUE IOHEXOL 350 MG/ML SOLN COMPARISON:  None. FINDINGS: CT HEAD Brain: No evidence of acute large vascular territory infarction, acute hemorrhage, hydrocephalus,  extra-axial collection or mass lesion/mass effect. Mild narrowing of the mamillopontine distance and effacement of the suprasellar cistern with trace tonsillar ectopia. Skull: No acute fracture. Sinuses: Visualized sinuses are clear. Orbits: No acute finding. CTA HEAD Due to venous timing, significantly limited evaluation. The intradural left vertebral artery is poorly opacified, which could relate to stenosis or non dominant vertebral artery. Otherwise, no evidence of proximal large vessel occlusion or large aneurysm in the anterior or  posterior circulation. CT VENOGRAM No evidence of dural sinus thrombosis. Small left transverse sinus and narrowing of the distal right transverse sinus. Small rounded filling defect in the distal right transverse sinus likely represents arachnoid granulation. IMPRESSION: CT head: 1. No definite evidence of acute intracranial abnormality. 2. Mild narrowing of the mamillopontine distance and effacement of the suprasellar cistern with trace tonsillar ectopia. Given the patient is young and has relative paucity of CSF spaces/sulci diffusely these findings may be within normal limits, but recommend correlation with signs/symptoms of intracranial hypotension including positional headaches. Additionally, per Dr. Rush Landmark the patient is going to get a follow-up MRI which can further evaluate these findings. CTA head: Due to venous timing, significantly limited evaluation. The intradural left vertebral artery is not well opacified, which is poorly evaluated and could relate to nondominant vertebral artery or stenosis. A CTA neck could further evaluate if clinically indicated. Otherwise, no evidence of proximal large vessel occlusion or large aneurysm. A CTA of the neck could further evaluate the vertebral finding and a repeat CTA head could provide more sensitive intracranial arterial evaluation if clinically indicated. CTV: No evidence of dural sinus thrombosis. Findings discussed with Dr. Julieanne Manson at 3:05 PM via telephone. Electronically Signed   By: Feliberto Harts MD   On: 01/27/2021 15:15   US Abdomen Limited RUQ (LIVER/GB)  Result Date: 02/20/2021 CLINICAL DATA:  Transaminitis EXAM: ULTRASOUND ABDOMEN LIMITED RIGHT UPPER QUADRANT COMPARISON:  CT abdomen and pelvis 02/16/2021 FINDINGS: Gallbladder: Normally distended. 6 mm nonshadowing intraluminal echogenic focus likely small polyp; no follow-up imaging recommended. No gallbladder wall thickening, pericholecystic fluid or sonographic Murphy sign. No discrete  shadowing calculi. Common bile duct: Diameter: 2 mm Liver: Slightly heterogeneous echogenicity, nonspecific. No discrete hepatic mass or nodularity. No intrahepatic biliary dilatation. Portal vein is patent on color Doppler imaging with normal direction of blood flow towards the liver. Other: No RIGHT upper quadrant free fluid. IMPRESSION: 6 mm gallbladder polyp; no follow-up imaging recommended. Nonspecific mildly heterogeneous hepatic parenchymal echogenicity without mass or biliary dilatation. Electronically Signed   By: Ulyses Southward M.D.   On: 02/20/2021 18:13

## 2021-02-25 NOTE — Progress Notes (Signed)
Denise Watson  Assessment/ Plan: Denise Watson is a 55 y.o. yo female with history of psoriasis, COVID-19 infection, recent diagnosis of HTN restarted HCTZ presented with generalized body pain, shortness of breath and found to have AKI.  Acute kidney injury with nephrotic syndrome, dialysis-dependent: The acute elevation of serum creatinine level to 10 from normal creatinine about a month ago. The urine study with proteinuria around 6g, serum albumin 1.5 and trace edema. No microscopic hematuria or WBC in the first urinalysis.   Repeat urinalysis was after placement of Foley catheter.  Kidney ultrasound without hydronephrosis. The lab result came back with elevated antidsDNA >300 with low C3 and C4 and high ANA titers. 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  Full house staining for immunoglobins and complements as well as tubular basement membrane deposits.  Her GN has low activity and low chronicity. Intense full house staining. Her acute tubular injury is likely related to collapsing glomerulopathy. Final biopsy report pending. -We started solumedrol 250 mg IV on 3/19 for 3 days and now on prednisone 60 mg as of 3/22, plan to taper in 2 weeks, plan to taper to 40mg  on 4/5. Further IS as below -Started dialysis on 3/19 for oliguric AKI and azotemia. RIJ temp HD catheter placed by IR. -TDC placed 3/25 with IR -HD today, running on TTS sched  Lupus Nephritis Class 3 -full house staining, low chronicity/low activity -Continue with prednisone 60 mg, plan to taper to 40 mg on 4/5 -We will start CellCept, low-dose 25mg  bid.  Side effect profile reviewed with patient on 3/24.  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  I did not expect this  histopathological finding on her biopsy.  I do wonder if there is genetic component (APOL1 mutation) to this or if this is related to her Covid infection back in January (however this was a presumed diagnosis, was not tested). Interestingly, her father was on dialysis by the age of 54 until he passed in his 6040's, cause of his ESRD was unknown and thought to be secondary to HTN, wonder if he had a similar presentation.  Nonetheless this is an overall poor kidney prognostic sign.  She is on steroids for LN which we will continue also for this.  Final biopsy report pending  Hypertension: Blood pressure acceptable.  UF as tolerated  Exertional shortness of breath: Chest x-ray negative.    Echo with EF of 60%.  UF with HD.  Sore throat: Reportedly she was treated with penicillin by her PCP.    ASO titer not elevated.  Transaminitis: work up per primary. improved  Hyponatremia: managing with HD, UF as tolerated  Nausea/vomiting/abd pain -would not expect this to be uremic symptoms still, would expect this to have improved by now given adequate dialysis and stable BUNs -ct a/p w/o contrast  Subjective: Seen and examined at bedside.  Still with low appetite, intermittent n/v. abd pain still present. Discussed with primary, plan for ct a/p w/o contrast today. Sisters at the bedside, had an extensive discussion in regards to plan and family history and pathology findings. Patient denies having any other complaints.   Objective Vital signs in last 24 hours: Vitals:   02/25/21 1335 02/25/21 1350 02/25/21 1405 02/25/21 1435  BP: 128/82 120/81 121/82 118/80  Pulse:  Resp: 13 14 12 15   Temp:      TempSrc:      SpO2:      Weight:      Height:       Weight change:  No intake or output data in the 24 hours ending 02/25/21 1506     Labs: Basic Metabolic Panel: Recent Labs  Lab 02/21/21 1137 02/23/21 0357 02/24/21 0313 02/25/21 0359  NA 131* 130* 134* 135  K 4.8 5.0 4.8 4.8  CL 97*  96* 98 99  CO2 20* 23 26 26   GLUCOSE 144* 171* 145* 121*  BUN 98* 73* 47* 61*  CREATININE 11.78* 10.72* 8.55* 9.76*  CALCIUM 7.9* 7.6* 7.7* 8.0*  PHOS 8.6*  --  7.0*  --    Liver Function Tests: Recent Labs  Lab 02/23/21 0357 02/24/21 0313 02/25/21 0359  AST 106* 79* 57*  ALT 94* 76* 37  ALKPHOS 75 74 67  BILITOT 0.6 0.7 0.4  PROT 5.7* 6.1* 5.7*  ALBUMIN 1.9* 2.0*  1.9* 1.9*   No results for input(s): LIPASE, AMYLASE in the last 168 hours. Recent Labs  Lab 02/25/21 0359  AMMONIA 14   CBC: Recent Labs  Lab 02/19/21 0206 02/20/21 0310 02/21/21 1137 02/24/21 0313 02/25/21 0359  WBC 5.3 7.0 9.7 14.4* 9.3  NEUTROABS 4.1 5.2  --   --   --   HGB 11.4* 10.8* 11.0* 11.0* 10.5*  HCT 32.9* 32.1* 33.8* 33.6* 32.0*  MCV 87.5 88.7 89.9 90.3 91.7  PLT 100* 93* 143* 134* 125*   Cardiac Enzymes: No results for input(s): CKTOTAL, CKMB, CKMBINDEX, TROPONINI in the last 168 hours. CBG: No results for input(s): GLUCAP in the last 168 hours.  Iron Studies: No results for input(s): IRON, TIBC, TRANSFERRIN, FERRITIN in the last 72 hours. Studies/Results: CT HEAD WO CONTRAST  Result Date: 02/24/2021 CLINICAL DATA:  Delirium EXAM: CT HEAD WITHOUT CONTRAST TECHNIQUE: Contiguous axial images were obtained from the base of the skull through the vertex without intravenous contrast. COMPARISON:  01/27/2021 FINDINGS: Brain: There is no acute intracranial hemorrhage, mass effect, or edema. Gray-white differentiation is preserved. There is no extra-axial fluid collection. Ventricles and sulci are within normal limits in size and configuration. Vascular: No hyperdense vessel or unexpected calcification. Skull: Calvarium is unremarkable. Sinuses/Orbits: No acute finding. Other: Partially empty sella. IMPRESSION: No acute intracranial abnormality. Electronically Signed   By: 02/26/2021 M.D.   On: 02/24/2021 15:59   IR Fluoro Guide CV Line Right  Result Date: 02/24/2021 CLINICAL DATA:  Renal  failure and need for hemodialysis. The patient currently has a non tunneled temporary dialysis catheter via the right internal jugular vein and requires a tunneled dialysis catheter for longer-term hemodialysis. EXAM: TUNNELED CENTRAL VENOUS HEMODIALYSIS CATHETER PLACEMENT WITH ULTRASOUND AND FLUOROSCOPIC GUIDANCE ANESTHESIA/SEDATION: 1.0 mg IV Versed; 50 mcg IV Fentanyl. Total Moderate Sedation Time:   25 minutes. The patient's level of consciousness and physiologic status were continuously monitored during the procedure by Radiology nursing. MEDICATIONS: 3 g IV Ancef. FLUOROSCOPY TIME:  24 seconds.  2.5 mGy. PROCEDURE: The procedure, risks, benefits, and alternatives were explained to the patient. Questions regarding the procedure were encouraged and answered. The patient understands and consents to the procedure. A timeout was performed prior to initiating the procedure. The pre-existing non tunneled right jugular temporary dialysis catheter was removed and hemostasis obtained with manual compression over the exit site. Ultrasound was used to confirm patency of the right internal jugular vein after non tunneled catheter removal.  The right neck and chest were prepped with chlorhexidine in a sterile fashion, and a sterile drape was applied covering the operative field. Maximum barrier sterile technique with sterile gowns and gloves were used for the procedure. Local anesthesia was provided with 1% lidocaine. After creating a small venotomy incision, a 21 gauge needle was advanced into the right internal jugular vein under direct, real-time ultrasound guidance. Ultrasound image documentation was performed. After securing guidewire access, an 8 Fr dilator was placed. A J-wire was kinked to measure appropriate catheter length. A Palindrome tunneled hemodialysis catheter measuring 19 cm from tip to cuff was chosen for placement. This was tunneled in a retrograde fashion from the chest wall to the venotomy incision. At  the venotomy, serial dilatation was performed and a 15 Fr peel-away sheath was placed over a guidewire. The catheter was then placed through the sheath and the sheath removed. Final catheter positioning was confirmed and documented with a fluoroscopic spot image. The catheter was aspirated, flushed with saline, and injected with appropriate volume heparin dwells. The venotomy incision was closed with subcuticular 4-0 Vicryl. Dermabond was applied to the incision. The catheter exit site was secured with 0-Prolene retention sutures. COMPLICATIONS: None.  No pneumothorax. FINDINGS: After catheter placement, the tip lies in the right atrium. The catheter aspirates normally and is ready for immediate use. IMPRESSION: Placement of tunneled hemodialysis catheter via the right internal jugular vein. The catheter tip lies in the right atrium. The catheter is ready for immediate use. Electronically Signed   By: Irish Lack M.D.   On: 02/24/2021 12:01   IR US Guidance  Result Date: 02/24/2021 CLINICAL DATA:  Renal failure and need for hemodialysis. The patient currently has a non tunneled temporary dialysis catheter via the right internal jugular vein and requires a tunneled dialysis catheter for longer-term hemodialysis. EXAM: TUNNELED CENTRAL VENOUS HEMODIALYSIS CATHETER PLACEMENT WITH ULTRASOUND AND FLUOROSCOPIC GUIDANCE ANESTHESIA/SEDATION: 1.0 mg IV Versed; 50 mcg IV Fentanyl. Total Moderate Sedation Time:   25 minutes. The patient's level of consciousness and physiologic status were continuously monitored during the procedure by Radiology nursing. MEDICATIONS: 3 g IV Ancef. FLUOROSCOPY TIME:  24 seconds.  2.5 mGy. PROCEDURE: The procedure, risks, benefits, and alternatives were explained to the patient. Questions regarding the procedure were encouraged and answered. The patient understands and consents to the procedure. A timeout was performed prior to initiating the procedure. The pre-existing non tunneled right  jugular temporary dialysis catheter was removed and hemostasis obtained with manual compression over the exit site. Ultrasound was used to confirm patency of the right internal jugular vein after non tunneled catheter removal. The right neck and chest were prepped with chlorhexidine in a sterile fashion, and a sterile drape was applied covering the operative field. Maximum barrier sterile technique with sterile gowns and gloves were used for the procedure. Local anesthesia was provided with 1% lidocaine. After creating a small venotomy incision, a 21 gauge needle was advanced into the right internal jugular vein under direct, real-time ultrasound guidance. Ultrasound image documentation was performed. After securing guidewire access, an 8 Fr dilator was placed. A J-wire was kinked to measure appropriate catheter length. A Palindrome tunneled hemodialysis catheter measuring 19 cm from tip to cuff was chosen for placement. This was tunneled in a retrograde fashion from the chest wall to the venotomy incision. At the venotomy, serial dilatation was performed and a 15 Fr peel-away sheath was placed over a guidewire. The catheter was then placed through the sheath and the sheath  removed. Final catheter positioning was confirmed and documented with a fluoroscopic spot image. The catheter was aspirated, flushed with saline, and injected with appropriate volume heparin dwells. The venotomy incision was closed with subcuticular 4-0 Vicryl. Dermabond was applied to the incision. The catheter exit site was secured with 0-Prolene retention sutures. COMPLICATIONS: None.  No pneumothorax. FINDINGS: After catheter placement, the tip lies in the right atrium. The catheter aspirates normally and is ready for immediate use. IMPRESSION: Placement of tunneled hemodialysis catheter via the right internal jugular vein. The catheter tip lies in the right atrium. The catheter is ready for immediate use. Electronically Signed   By: Irish Lack M.D.   On: 02/24/2021 12:01    Medications: Infusions:   Scheduled Medications: . Chlorhexidine Gluconate Cloth  6 each Topical Q0600  . famotidine  10 mg Oral Daily  . feeding supplement (NEPRO CARB STEADY)  237 mL Oral TID BM  . heparin injection (subcutaneous)  5,000 Units Subcutaneous Q8H  . heparin sodium (porcine)      . kidney failure book   Does not apply Once  . mycophenolate  250 mg Oral BID  . polyethylene glycol  17 g Oral Daily  . predniSONE  60 mg Oral Q breakfast   I have reviewed scheduled and prn medications.  Physical Exam: General:NAD, laying flat Heart:RRR, s1s2 nl Lungs:  no iwob Abdomen:soft, Non-tender, non-distended Extremities: Trace LE edema b/l Dialysis Access: Right tunneled HD catheter  Denise Watson 02/25/2021,3:06 PM  LOS: 9 days

## 2021-02-25 NOTE — Progress Notes (Signed)
Patient back from HD. Patient not wanting to drink contrast for CT ABD. She is agreeing to drink tomorrow. MD made aware

## 2021-02-26 ENCOUNTER — Inpatient Hospital Stay (HOSPITAL_COMMUNITY): Payer: BC Managed Care – PPO

## 2021-02-26 DIAGNOSIS — R7401 Elevation of levels of liver transaminase levels: Secondary | ICD-10-CM | POA: Diagnosis not present

## 2021-02-26 DIAGNOSIS — R1084 Generalized abdominal pain: Secondary | ICD-10-CM | POA: Diagnosis not present

## 2021-02-26 DIAGNOSIS — N179 Acute kidney failure, unspecified: Secondary | ICD-10-CM | POA: Diagnosis not present

## 2021-02-26 LAB — LIPASE, BLOOD: Lipase: 178 U/L — ABNORMAL HIGH (ref 11–51)

## 2021-02-26 LAB — COMPREHENSIVE METABOLIC PANEL
ALT: 22 U/L (ref 0–44)
AST: 50 U/L — ABNORMAL HIGH (ref 15–41)
Albumin: 1.9 g/dL — ABNORMAL LOW (ref 3.5–5.0)
Alkaline Phosphatase: 67 U/L (ref 38–126)
Anion gap: 10 (ref 5–15)
BUN: 40 mg/dL — ABNORMAL HIGH (ref 6–20)
CO2: 27 mmol/L (ref 22–32)
Calcium: 7.7 mg/dL — ABNORMAL LOW (ref 8.9–10.3)
Chloride: 97 mmol/L — ABNORMAL LOW (ref 98–111)
Creatinine, Ser: 6.59 mg/dL — ABNORMAL HIGH (ref 0.44–1.00)
GFR, Estimated: 7 mL/min — ABNORMAL LOW (ref >60)
Glucose, Bld: 207 mg/dL — ABNORMAL HIGH (ref 70–99)
Potassium: 4.6 mmol/L (ref 3.5–5.1)
Sodium: 134 mmol/L — ABNORMAL LOW (ref 135–145)
Total Bilirubin: 0.5 mg/dL (ref 0.3–1.2)
Total Protein: 5.5 g/dL — ABNORMAL LOW (ref 6.5–8.1)

## 2021-02-26 LAB — CBC
HCT: 29.2 % — ABNORMAL LOW (ref 36.0–46.0)
Hemoglobin: 9.2 g/dL — ABNORMAL LOW (ref 12.0–15.0)
MCH: 29.3 pg (ref 26.0–34.0)
MCHC: 31.5 g/dL (ref 30.0–36.0)
MCV: 93 fL (ref 80.0–100.0)
Platelets: 104 10*3/uL — ABNORMAL LOW (ref 150–400)
RBC: 3.14 MIL/uL — ABNORMAL LOW (ref 3.87–5.11)
RDW: 13.9 % (ref 11.5–15.5)
WBC: 11.2 10*3/uL — ABNORMAL HIGH (ref 4.0–10.5)
nRBC: 0 % (ref 0.0–0.2)

## 2021-02-26 MED ORDER — TRAMADOL HCL 50 MG PO TABS
50.0000 mg | ORAL_TABLET | Freq: Three times a day (TID) | ORAL | Status: DC | PRN
  Administered 2021-02-26 – 2021-02-28 (×3): 50 mg via ORAL
  Filled 2021-02-26 (×3): qty 1

## 2021-02-26 MED ORDER — POLYETHYLENE GLYCOL 3350 17 G PO PACK
34.0000 g | PACK | Freq: Once | ORAL | Status: AC
  Administered 2021-02-26: 34 g via ORAL
  Filled 2021-02-26: qty 2

## 2021-02-26 MED ORDER — BISACODYL 5 MG PO TBEC
10.0000 mg | DELAYED_RELEASE_TABLET | Freq: Once | ORAL | Status: AC
  Administered 2021-02-26: 10 mg via ORAL
  Filled 2021-02-26: qty 2

## 2021-02-26 MED ORDER — IOHEXOL 9 MG/ML PO SOLN
500.0000 mL | ORAL | Status: AC
  Administered 2021-02-26 (×2): 500 mL via ORAL

## 2021-02-26 NOTE — Progress Notes (Signed)
PROGRESS NOTE                                                                                                                                                                                                             Patient Demographics:    Denise Watson, is a 55 y.o. female, DOB - 1965/12/18, NWG:[id number]  Outpatient Primary MD for the patient is Piedad Climes, [place]    LOS - 10  Admit date - 02/16/2021    Chief Complaint  Patient presents with   Shortness of Breath       Brief Narrative  -  Denise Watson  is a 55 y.o. female, who is in relatively good health except recent diagnosis of essential hypertension requiring HCTZ for the last few weeks, obesity, COVID-19 infection in January, psoriasis, C-section in the past, intermittent palpitations in the past.  Apparently patient has been having symptoms of sore throat for the last 2 weeks and subsequently has been developing gradual fatigue and some exertional shortness of breath, denies any fever chills, no chest or abdominal pain, no blood in stool or urine, no cough or sinus pressure, no swelling in arms or legs, she does have petechial rash but she says this is due to her psoriasis and is chronic.  She presented to the ER where she was found to have AKI with a creatinine of 10, nephrology was consulted and I was requested to admit the patient.  Currently review of systems positive for sore throat, fatigue, exertional shortness of breath, chronic petechial rash due to psoriasis according to the patient.     Subjective:   Patient still complains of abdominal pain, no shortness of breath, no chest pain, no fever or chills.     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 -TDC right IJ placed by IR 3/25. -started on cellcept -Still complaining of abdominal pain, will obtain  for CT abdomen pelvis. -Mentation improved after holding tramadol.   Petechial rash.  - Patient claims this is chronic due to psoriasis but could be due to platelet dysfunction caused by AKI.  Will monitor.  Platelet counts are stable pressure stable.  Borderline hyperkalemia.  -  Stable after kayaxalate and HD.  Exertional shortness of breath -  likely due to AKI, stable TTE.  Significantly improved after starting hemodialysis  Obesity.  BMI 39.  Follow with PCP.  Hypertension.  Gentle IV hydralazine, will avoid hypotension due to AKI.  Pharyngitis.  Strep throat culture negative, strep urinary antigen negative, ASO titer negative, likely viral pharyngitis  History of psoriasis.  Question if she has discoid lupus.  Outpatient dermatology follow-up.  Asymptomatic mild rise in LFTs.  Would be due to hepatic congestion along with acute hepatitis panel negative, she is symptom-free.Stable RUQ [place] with incidental gallbladder polyp, outpatient follow-up with PCP.  LFTs trending down.       Condition -   Guarded  Family Communication  : Discussed with sister at bedside  Code Status :  Full  Consults  :  Renal  PUD Prophylaxis :    Procedures  :     RUQ US - GB Polyp  R. Kidney biopsy 02/20/21  R.IJ HD Cath by IR 02/18/21  Renal US - medical renal disease.  TTE - 1. Left ventricular ejection fraction, by estimation, is 60%. The left ventricle has normal function. The left ventricle has no regional wall motion abnormalities. There is mild left ventricular hypertrophy. Left ventricular diastolic parameters were low normal.  2. Right ventricular systolic function is normal. The right ventricular size is normal. Tricuspid regurgitation signal is inadequate for assessing PA pressure.  3. The mitral valve is normal in structure. Trivial mitral valve  regurgitation. No evidence of mitral stenosis.  4. The aortic valve is tricuspid. Aortic valve regurgitation is not visualized. No aortic stenosis is present.  5. The inferior vena cava is normal in size with greater than 50% respiratory variability, suggesting right atrial pressure of 3 mmHg.       Disposition Plan  :    Status is: Inpatient  Remains inpatient appropriate because:IV treatments appropriate due to intensity of illness or inability to take PO   Dispo: The patient is from: Home              Anticipated d/c is to: Home              Patient currently is not medically stable to d/c.   Difficult to place patient No  DVT Prophylaxis  :  Heparin   Lab Results  Component Value Date   PLT 104 (L) 02/26/2021    Diet :  Diet Order            Diet renal/carb modified with fluid restriction Diet-HS Snack? Nothing; Fluid restriction: Other (see comments); Room service appropriate? Yes; Fluid consistency: Thin  Diet effective now                  Inpatient Medications  Scheduled Meds:  bisacodyl  10 mg Oral Once   Chlorhexidine Gluconate Cloth  6 each Topical Q0600   famotidine  10 mg Oral Daily   feeding supplement (NEPRO CARB STEADY)  237 mL Oral TID BM   heparin injection (subcutaneous)  5,000 Units Subcutaneous Q8H   kidney failure book   Does not apply Once   mycophenolate  250 mg Oral BID   polyethylene glycol  17 g Oral Daily   polyethylene glycol  34 g Oral Once   predniSONE  60 mg Oral Q breakfast   Continuous Infusions:  PRN Meds:.acetaminophen **OR** [DISCONTINUED] acetaminophen, alum & mag hydroxide-simeth, bisacodyl, docusate sodium, hydrALAZINE, [DISCONTINUED] ondansetron **OR** ondansetron (ZOFRAN) IV, ondansetron,  phenol, sodium chloride flush, traZODone  Antibiotics  :    Anti-infectives (From admission, onward)   Start     Dose/Rate Route Frequency Ordered Stop   02/24/21 1400  ceFAZolin (ANCEF) 3 g in dextrose 5 % 50 mL IVPB  Status:   Discontinued        3 g 100 mL/hr over 30 Minutes Intravenous To Radiology 02/23/21 1350 02/24/21 1036   02/24/21 1045  ceFAZolin (ANCEF) 3 g in dextrose 5 % 50 mL IVPB        3 g 100 mL/hr over 30 Minutes Intravenous To Radiology 02/24/21 1036 02/24/21 1040       Ravinder Hofland M.D on 02/26/2021 at 1:23 PM  To page go to [email or web address]   [facility] -  Office  [phone]   See all Orders from today for further details    Objective:   Vitals:   02/25/21 1643 02/25/21 1711 02/25/21 2008 02/26/21 0501  BP: 106/71 106/71 119/76 (!) 149/89  Pulse: 81 82 95 71  Resp: 18 19 13 16   Temp: 97.6 F (36.4 C) 97.6 F (36.4 C) 98.2 F (36.8 C) 97.9 F (36.6 C)  TempSrc: Oral Oral Oral Oral  SpO2: 97% 97% 100% 99%  Weight: 129.5 kg     Height:        Wt Readings from Last 3 Encounters:  02/25/21 129.5 kg  01/27/21 108.9 kg  06/27/16 132 kg     Intake/Output Summary (Last 24 hours) at 02/26/2021 1323 Last data filed at 02/25/2021 1643 Gross per 24 hour  Intake --  Output 2000 ml  Net -2000 ml     Physical Exam  Awake Alert, Oriented X 3, No new F.N deficits, Normal affect Symmetrical Chest wall movement, Good air movement bilaterally, CTAB RRR,No Gallops,Rubs or new Murmurs, No Parasternal Heave +ve B.Sounds, Abd Soft, mild diffuse abdominal pain tenderness to deep palpation, no rebound - guarding or rigidity. No Cyanosis, Clubbing or edema, No new Rash or bruise       Data Review:    CBC Recent Labs  Lab 02/20/21 0310 02/21/21 1137 02/24/21 0313 02/25/21 0359 02/26/21 0500  WBC 7.0 9.7 14.4* 9.3 11.2*  HGB 10.8* 11.0* 11.0* 10.5* 9.2*  HCT 32.1* 33.8* 33.6* 32.0* 29.2*  PLT 93* 143* 134* 125* 104*  MCV 88.7 89.9 90.3 91.7 93.0  MCH 29.8 29.3 29.6 30.1 29.3  MCHC 33.6 32.5 32.7 32.8 31.5  RDW 13.6 13.8 13.8 14.0 13.9  LYMPHSABS 1.1  --   --   --   --   MONOABS 0.6  --   --   --   --   EOSABS 0.0  --   --   --   --   BASOSABS 0.0  --   --    --   --     Recent Labs  Lab 02/20/21 0310 02/21/21 1137 02/22/21 0601 02/23/21 0357 02/24/21 0313 02/25/21 0359 02/26/21 0500  NA 130* 131*  --  130* 134* 135 134*  K 4.5 4.8  --  5.0 4.8 4.8 4.6  CL 97* 97*  --  96* 98 99 97*  CO2 22 20*  --  23 26 26 27   GLUCOSE 152* 144*  --  171* 145* 121* 207*  BUN 71* 98*  --  73* 47* 61* 40*  CREATININE 9.47* 11.78*  --  10.72* 8.55* 9.76* 6.59*  CALCIUM 7.7* 7.9*  --  7.6* 7.7* 8.0* 7.7*  AST 163*  --  158* 106* 79* 57* 50*  ALT 81*  --  116* 94* 76* 37 22  ALKPHOS 78  --  81 75 74 67 67  BILITOT 0.6  --  0.7 0.6 0.7 0.4 0.5  ALBUMIN 1.6* 1.8* 2.0* 1.9* 2.0*   1.9* 1.9* 1.9*  MG 2.2  --   --   --   --   --   --   AMMONIA  --   --   --   --   --  14  --   BNP 34.5  --   --   --   --   --   --     ------------------------------------------------------------------------------------------------------------------ No results for input(s): CHOL, HDL, LDLCALC, TRIG, CHOLHDL, LDLDIRECT in the last 72 hours.  Lab Results  Component Value Date   HGBA1C 6.3 (H) 02/16/2021   ------------------------------------------------------------------------------------------------------------------ No results for input(s): TSH, T4TOTAL, T3FREE, THYROIDAB in the last 72 hours.  Invalid input(s): FREET3  Cardiac Enzymes No results for input(s): CKMB, TROPONINI, MYOGLOBIN in the last 168 hours.  Invalid input(s): CK ------------------------------------------------------------------------------------------------------------------    Component Value Date/Time   BNP 34.5 02/20/2021 0310    Micro Results Recent Results (from the past 240 hour(s))  SARS CORONAVIRUS 2 (TAT 6-24 HRS) Nasopharyngeal Nasopharyngeal Swab     Status: None   Collection Time: 02/16/21  4:25 PM   Specimen: Nasopharyngeal Swab  Result Value Ref Range Status   SARS Coronavirus 2 NEGATIVE NEGATIVE Final    Comment: (NOTE) SARS-CoV-2 target nucleic acids are NOT  DETECTED.  The SARS-CoV-2 RNA is generally detectable in upper and lower respiratory specimens during the acute phase of infection. Negative results do not preclude SARS-CoV-2 infection, do not rule out co-infections with other pathogens, and should not be used as the sole basis for treatment or other patient management decisions. Negative results must be combined with clinical observations, patient history, and epidemiological information. The expected result is Negative.  Fact Sheet for Patients: HairSlick.no  Fact Sheet for Healthcare Providers: quierodirigir.com  This test is not yet approved or cleared by the [place] FDA and  has been authorized for detection and/or diagnosis of SARS-CoV-2 by FDA under an Emergency Use Authorization (EUA). This EUA will remain  in effect (meaning this test can be used) for the duration of the COVID-19 declaration under Se ction 564(b)(1) of the Act, 21 U.S.C. section 360bbb-3(b)(1), unless the authorization is terminated or revoked sooner.  Performed at [facility], [address], [place], [place] [address]   Respiratory (~20 pathogens) panel by PCR     Status: None   Collection Time: 02/16/21  5:06 PM   Specimen: Nasopharyngeal Swab; Respiratory  Result Value Ref Range Status   Adenovirus NOT DETECTED NOT DETECTED Final   Coronavirus 229E NOT DETECTED NOT DETECTED Final    Comment: (NOTE) The Coronavirus on the Respiratory Panel, DOES NOT test for the novel  Coronavirus (2019 nCoV)    Coronavirus HKU1 NOT DETECTED NOT DETECTED Final   Coronavirus NL63 NOT DETECTED NOT DETECTED Final   Coronavirus OC43 NOT DETECTED NOT DETECTED Final   Metapneumovirus NOT DETECTED NOT DETECTED Final   Rhinovirus / Enterovirus NOT DETECTED NOT DETECTED Final   Influenza A NOT DETECTED NOT DETECTED Final   Influenza B NOT DETECTED NOT DETECTED Final   Parainfluenza Virus 1 NOT DETECTED  NOT DETECTED Final   Parainfluenza Virus 2 NOT DETECTED NOT DETECTED Final   Parainfluenza Virus 3 NOT DETECTED NOT DETECTED Final  Parainfluenza Virus 4 NOT DETECTED NOT DETECTED Final   Respiratory Syncytial Virus NOT DETECTED NOT DETECTED Final   Bordetella pertussis NOT DETECTED NOT DETECTED Final   Bordetella Parapertussis NOT DETECTED NOT DETECTED Final   Chlamydophila pneumoniae NOT DETECTED NOT DETECTED Final   Mycoplasma pneumoniae NOT DETECTED NOT DETECTED Final    Comment: Performed at [facility], [address] [place], [place] [address]  Culture, group A strep     Status: None   Collection Time: 02/17/21 11:24 AM   Specimen: Throat  Result Value Ref Range Status   Specimen Description THROAT  Final   Special Requests Normal  Final   Culture   Final    NO GROUP A STREP (S.PYOGENES) ISOLATED Performed at [facility], [address] [place], [place] [address]    Report Status 02/19/2021 FINAL  Final    Radiology Reports CT ABDOMEN PELVIS WO CONTRAST  Result Date: 02/16/2021 CLINICAL DATA:  Abdominal pain. EXAM: CT ABDOMEN AND PELVIS WITHOUT CONTRAST TECHNIQUE: Multidetector CT imaging of the abdomen and pelvis was performed following the standard protocol without IV contrast. COMPARISON:  None. FINDINGS: Lower chest: No acute abnormality. Hepatobiliary: No focal liver abnormality is seen. No gallstones, gallbladder wall thickening, or biliary dilatation. Pancreas: Unremarkable. No pancreatic ductal dilatation or surrounding inflammatory changes. Spleen: Normal in size without focal abnormality. Adrenals/Urinary Tract: Adrenal glands appear normal. No hydronephrosis or renal obstruction is noted. No renal or ureteral calculi are noted. Urinary bladder is decompressed secondary to Foley catheter. Minimal inflammatory changes are noted around the right kidney suggesting possible pyelonephritis. Stomach/Bowel: Stomach is within normal limits. Appendix appears normal.  No evidence of bowel wall thickening, distention, or inflammatory changes. Vascular/Lymphatic: No significant vascular findings are present. No enlarged abdominal or pelvic lymph nodes. Reproductive: No adnexal abnormality is noted. Probable small exophytic uterine fibroid is noted arising posteriorly from the uterus. Other: No abdominal wall hernia or abnormality. No abdominopelvic ascites. Musculoskeletal: No acute or significant osseous findings. IMPRESSION: 1. Minimal inflammatory changes are noted around the right kidney suggesting possible pyelonephritis. 2. No hydronephrosis or renal obstruction is noted. No renal or ureteral calculi are noted. 3. Probable small exophytic uterine fibroid is noted arising posteriorly from the uterus. Electronically Signed   By: Lupita Raider M.D.   On: 02/16/2021 18:32   CT Angio Head W or Wo Contrast  Result Date: 01/27/2021 CLINICAL DATA:  Diplopia.  Dural sinus thrombosis suspected. EXAM: CT ANGIOGRAPHY HEAD CT VENOGRAM HEAD TECHNIQUE: Multidetector CT imaging of the head was performed using the standard protocol during bolus administration of intravenous contrast. Multiplanar CT image reconstructions and MIPs were obtained to evaluate the arterial anatomy. Additional delayed multidetector CT imaging of the head was performed with multiplanar CT image reconstructions and MIPS to evaluate the venous anatomy. CONTRAST:  OMNIPAQUE IOHEXOL 350 MG/ML SOLN COMPARISON:  None. FINDINGS: CT HEAD Brain: No evidence of acute large vascular territory infarction, acute hemorrhage, hydrocephalus, extra-axial collection or mass lesion/mass effect. Mild narrowing of the mamillopontine distance and effacement of the suprasellar cistern with trace tonsillar ectopia. Skull: No acute fracture. Sinuses: Visualized sinuses are clear. Orbits: No acute finding. CTA HEAD Due to venous timing, significantly limited evaluation. The intradural left vertebral artery is poorly opacified,  which could relate to stenosis or non dominant vertebral artery. Otherwise, no evidence of proximal large vessel occlusion or large aneurysm in the anterior or posterior circulation. CT VENOGRAM No evidence of dural sinus thrombosis. Small left  transverse sinus and narrowing of the distal right transverse sinus. Small rounded filling defect in the distal right transverse sinus likely represents arachnoid granulation. IMPRESSION: CT head: 1. No definite evidence of acute intracranial abnormality. 2. Mild narrowing of the mamillopontine distance and effacement of the suprasellar cistern with trace tonsillar ectopia. Given the patient is young and has relative paucity of CSF spaces/sulci diffusely these findings may be within normal limits, but recommend correlation with signs/symptoms of intracranial hypotension including positional headaches. Additionally, per Dr. Rush Landmarkegeler the patient is going to get a follow-up MRI which can further evaluate these findings. CTA head: Due to venous timing, significantly limited evaluation. The intradural left vertebral artery is not well opacified, which is poorly evaluated and could relate to nondominant vertebral artery or stenosis. A CTA neck could further evaluate if clinically indicated. Otherwise, no evidence of proximal large vessel occlusion or large aneurysm. A CTA of the neck could further evaluate the vertebral finding and a repeat CTA head could provide more sensitive intracranial arterial evaluation if clinically indicated. CTV: No evidence of dural sinus thrombosis. Findings discussed with Dr. Julieanne Mansonegler at 3:05 PM via telephone. Electronically Signed   By: Feliberto HartsFrederick S Jones MD   On: 01/27/2021 15:15   DG Chest 2 View  Result Date: 02/16/2021 CLINICAL DATA:  Shortness of breath. EXAM: CHEST - 2 VIEW COMPARISON:  January 27, 2021. FINDINGS: The heart size and mediastinal contours are within normal limits. Both lungs are clear. No pneumothorax or pleural effusion is  noted. The visualized skeletal structures are unremarkable. IMPRESSION: No active cardiopulmonary disease. Electronically Signed   By: Lupita RaiderJames  Green Jr M.D.   On: 02/16/2021 12:53   CT HEAD WO CONTRAST  Result Date: 02/24/2021 CLINICAL DATA:  Delirium EXAM: CT HEAD WITHOUT CONTRAST TECHNIQUE: Contiguous axial images were obtained from the base of the skull through the vertex without intravenous contrast. COMPARISON:  01/27/2021 FINDINGS: Brain: There is no acute intracranial hemorrhage, mass effect, or edema. Gray-white differentiation is preserved. There is no extra-axial fluid collection. Ventricles and sulci are within normal limits in size and configuration. Vascular: No hyperdense vessel or unexpected calcification. Skull: Calvarium is unremarkable. Sinuses/Orbits: No acute finding. Other: Partially empty sella. IMPRESSION: No acute intracranial abnormality. Electronically Signed   By: Guadlupe SpanishPraneil  Patel M.D.   On: 02/24/2021 15:59   US RENAL  Result Date: 02/21/2021 CLINICAL DATA:  Acute renal insufficiency EXAM: RENAL / URINARY TRACT ULTRASOUND COMPLETE COMPARISON:  02/20/2021 right upper quadrant ultrasound. Renal ultrasound 02/16/2021. FINDINGS: Right Kidney: Renal measurements: 12.3 by 6.0 x 5.5 cm = volume: 213 mL. Increased renal echogenicity. No hydronephrosis. Left Kidney: Renal measurements: 10.0 x 5.3 x 4.4 cm = volume: 128 mL. Increased renal echogenicity. No hydronephrosis. Bladder: Collapsed around a Foley catheter. Other: None. IMPRESSION: No hydronephrosis. Increased renal echogenicity, suggesting medical renal disease. Electronically Signed   By: Jeronimo GreavesKyle  Talbot M.D.   On: 02/21/2021 21:36   US Renal  Result Date: 02/16/2021 CLINICAL DATA:  Renal failure EXAM: RENAL / URINARY TRACT ULTRASOUND COMPLETE COMPARISON:  CT 02/16/2021 FINDINGS: Right Kidney: Renal measurements: 11.9 x 5.8 x 4.4 cm = volume: 159.2 mL. Cortex is echogenic. No mass or hydronephrosis. Trace perinephric fluid Left  Kidney: Renal measurements: 9.7 x 5.2 x 4.9 cm = volume: 129.5 mL. Cortex is echogenic. No mass or hydronephrosis. Bladder: Empty by Foley catheter. Other: None. IMPRESSION: Echogenic kidneys bilaterally consistent with medical renal disease. No hydronephrosis Electronically Signed   By: Jasmine PangKim  Fujinaga M.D.   On: 02/16/2021  20:23   IR Fluoro Guide CV Line Right  Result Date: 02/24/2021 CLINICAL DATA:  Renal failure and need for hemodialysis. The patient currently has a non tunneled temporary dialysis catheter via the right internal jugular vein and requires a tunneled dialysis catheter for longer-term hemodialysis. EXAM: TUNNELED CENTRAL VENOUS HEMODIALYSIS CATHETER PLACEMENT WITH ULTRASOUND AND FLUOROSCOPIC GUIDANCE ANESTHESIA/SEDATION: 1.0 mg IV Versed; 50 mcg IV Fentanyl. Total Moderate Sedation Time:   25 minutes. The patient's level of consciousness and physiologic status were continuously monitored during the procedure by Radiology nursing. MEDICATIONS: 3 g IV Ancef. FLUOROSCOPY TIME:  24 seconds.  2.5 mGy. PROCEDURE: The procedure, risks, benefits, and alternatives were explained to the patient. Questions regarding the procedure were encouraged and answered. The patient understands and consents to the procedure. A timeout was performed prior to initiating the procedure. The pre-existing non tunneled right jugular temporary dialysis catheter was removed and hemostasis obtained with manual compression over the exit site. Ultrasound was used to confirm patency of the right internal jugular vein after non tunneled catheter removal. The right neck and chest were prepped with chlorhexidine in a sterile fashion, and a sterile drape was applied covering the operative field. Maximum barrier sterile technique with sterile gowns and gloves were used for the procedure. Local anesthesia was provided with 1% lidocaine. After creating a small venotomy incision, a 21 gauge needle was advanced into the right internal  jugular vein under direct, real-time ultrasound guidance. Ultrasound image documentation was performed. After securing guidewire access, an 8 Fr dilator was placed. A J-wire was kinked to measure appropriate catheter length. A Palindrome tunneled hemodialysis catheter measuring 19 cm from tip to cuff was chosen for placement. This was tunneled in a retrograde fashion from the chest wall to the venotomy incision. At the venotomy, serial dilatation was performed and a 15 Fr peel-away sheath was placed over a guidewire. The catheter was then placed through the sheath and the sheath removed. Final catheter positioning was confirmed and documented with a fluoroscopic spot image. The catheter was aspirated, flushed with saline, and injected with appropriate volume heparin dwells. The venotomy incision was closed with subcuticular 4-0 Vicryl. Dermabond was applied to the incision. The catheter exit site was secured with 0-Prolene retention sutures. COMPLICATIONS: None.  No pneumothorax. FINDINGS: After catheter placement, the tip lies in the right atrium. The catheter aspirates normally and is ready for immediate use. IMPRESSION: Placement of tunneled hemodialysis catheter via the right internal jugular vein. The catheter tip lies in the right atrium. The catheter is ready for immediate use. Electronically Signed   By: Irish Lack M.D.   On: 02/24/2021 12:01   IR Fluoro Guide CV Line Right  Result Date: 02/18/2021 INDICATION: 55 year old female referred for temporary hemodialysis catheter placement EXAM: IMAGE GUIDED PLACEMENT OF TEMPORARY HEMODIALYSIS CATHETER MEDICATIONS: None ANESTHESIA/SEDATION: Moderate (conscious) sedation was employed during this procedure. A total of Versed 1.0 mg and Fentanyl 50 mcg was administered intravenously. Moderate Sedation Time: 13 minutes. The patient's level of consciousness and vital signs were monitored continuously by radiology nursing throughout the procedure under my direct  supervision. FLUOROSCOPY TIME:  Fluoroscopy Time: 0 minutes 6 seconds (1 mGy). COMPLICATIONS: None PROCEDURE: Informed written consent was obtained from the patient and the patient's family after a thorough discussion of the procedural risks, benefits and alternatives. All questions were addressed. A timeout was performed prior to the initiation of the procedure. The right neck and chest was prepped with chlorhexidine, and draped in the usual sterile fashion  using maximum barrier technique (cap and mask, sterile gown, sterile gloves, large sterile sheet, hand hygiene and cutaneous antiseptic). Local anesthesia was attained by infiltration with 1% lidocaine without epinephrine. Ultrasound demonstrated patency of the right internal jugular vein, and this was documented with an image. Under real-time ultrasound guidance, this vein was accessed with a 21 gauge micropuncture needle and image documentation was performed. A small dermatotomy was made at the access site with an 11 scalpel. A 0.018" wire was advanced into the SVC and the access needle exchanged for a 59F micropuncture vascular sheath. The 0.018" wire was then removed and a 0.035" wire advanced into the IVC. Upon withdrawal of the 018 wire, the wire was marked for appropriate length of the internal portion of the catheter. A 16 cm catheter was selected. Skin and subcutaneous tissues were serially dilated. Catheter was placed on the wire. The catheter tip is positioned in the upper right atrium. This was documented with a spot image. Both ports of the hemodialysis catheter were then tested for excellent function. The ports were then locked with heparinized lock. Patient tolerated the procedure well and remained hemodynamically stable throughout. No complications were encountered and no significant blood loss was encountered. IMPRESSION: Status post image guided placement of temporary hemodialysis catheter. Catheter may be converted if needed. Signed, Yvone Neu.  Reyne Dumas, RPVI [facility] Radiology Electronically Signed   By: Gilmer Mor D.O.   On: 02/18/2021 14:21   IR US Guide Vasc Access Right  Result Date: 02/18/2021 INDICATION: 55 year old female referred for temporary hemodialysis catheter placement EXAM: IMAGE GUIDED PLACEMENT OF TEMPORARY HEMODIALYSIS CATHETER MEDICATIONS: None ANESTHESIA/SEDATION: Moderate (conscious) sedation was employed during this procedure. A total of Versed 1.0 mg and Fentanyl 50 mcg was administered intravenously. Moderate Sedation Time: 13 minutes. The patient's level of consciousness and vital signs were monitored continuously by radiology nursing throughout the procedure under my direct supervision. FLUOROSCOPY TIME:  Fluoroscopy Time: 0 minutes 6 seconds (1 mGy). COMPLICATIONS: None PROCEDURE: Informed written consent was obtained from the patient and the patient's family after a thorough discussion of the procedural risks, benefits and alternatives. All questions were addressed. A timeout was performed prior to the initiation of the procedure. The right neck and chest was prepped with chlorhexidine, and draped in the usual sterile fashion using maximum barrier technique (cap and mask, sterile gown, sterile gloves, large sterile sheet, hand hygiene and cutaneous antiseptic). Local anesthesia was attained by infiltration with 1% lidocaine without epinephrine. Ultrasound demonstrated patency of the right internal jugular vein, and this was documented with an image. Under real-time ultrasound guidance, this vein was accessed with a 21 gauge micropuncture needle and image documentation was performed. A small dermatotomy was made at the access site with an 11 scalpel. A 0.018" wire was advanced into the SVC and the access needle exchanged for a 59F micropuncture vascular sheath. The 0.018" wire was then removed and a 0.035" wire advanced into the IVC. Upon withdrawal of the 018 wire, the  wire was marked for appropriate length of the internal portion of the catheter. A 16 cm catheter was selected. Skin and subcutaneous tissues were serially dilated. Catheter was placed on the wire. The catheter tip is positioned in the upper right atrium. This was documented with a spot image. Both ports of the hemodialysis catheter were then tested for excellent function. The ports were then locked with heparinized lock. Patient tolerated the procedure well and remained hemodynamically stable throughout. No complications were  encountered and no significant blood loss was encountered. IMPRESSION: Status post image guided placement of temporary hemodialysis catheter. Catheter may be converted if needed. Signed, Yvone Neu. Reyne Dumas, RPVI [facility] Radiology Electronically Signed   By: Gilmer Mor D.O.   On: 02/18/2021 14:21   IR US Guidance  Result Date: 02/24/2021 CLINICAL DATA:  Renal failure and need for hemodialysis. The patient currently has a non tunneled temporary dialysis catheter via the right internal jugular vein and requires a tunneled dialysis catheter for longer-term hemodialysis. EXAM: TUNNELED CENTRAL VENOUS HEMODIALYSIS CATHETER PLACEMENT WITH ULTRASOUND AND FLUOROSCOPIC GUIDANCE ANESTHESIA/SEDATION: 1.0 mg IV Versed; 50 mcg IV Fentanyl. Total Moderate Sedation Time:   25 minutes. The patient's level of consciousness and physiologic status were continuously monitored during the procedure by Radiology nursing. MEDICATIONS: 3 g IV Ancef. FLUOROSCOPY TIME:  24 seconds.  2.5 mGy. PROCEDURE: The procedure, risks, benefits, and alternatives were explained to the patient. Questions regarding the procedure were encouraged and answered. The patient understands and consents to the procedure. A timeout was performed prior to initiating the procedure. The pre-existing non tunneled right jugular temporary dialysis catheter was removed and hemostasis obtained  with manual compression over the exit site. Ultrasound was used to confirm patency of the right internal jugular vein after non tunneled catheter removal. The right neck and chest were prepped with chlorhexidine in a sterile fashion, and a sterile drape was applied covering the operative field. Maximum barrier sterile technique with sterile gowns and gloves were used for the procedure. Local anesthesia was provided with 1% lidocaine. After creating a small venotomy incision, a 21 gauge needle was advanced into the right internal jugular vein under direct, real-time ultrasound guidance. Ultrasound image documentation was performed. After securing guidewire access, an 8 Fr dilator was placed. A J-wire was kinked to measure appropriate catheter length. A Palindrome tunneled hemodialysis catheter measuring 19 cm from tip to cuff was chosen for placement. This was tunneled in a retrograde fashion from the chest wall to the venotomy incision. At the venotomy, serial dilatation was performed and a 15 Fr peel-away sheath was placed over a guidewire. The catheter was then placed through the sheath and the sheath removed. Final catheter positioning was confirmed and documented with a fluoroscopic spot image. The catheter was aspirated, flushed with saline, and injected with appropriate volume heparin dwells. The venotomy incision was closed with subcuticular 4-0 Vicryl. Dermabond was applied to the incision. The catheter exit site was secured with 0-Prolene retention sutures. COMPLICATIONS: None.  No pneumothorax. FINDINGS: After catheter placement, the tip lies in the right atrium. The catheter aspirates normally and is ready for immediate use. IMPRESSION: Placement of tunneled hemodialysis catheter via the right internal jugular vein. The catheter tip lies in the right atrium. The catheter is ready for immediate use. Electronically Signed   By: Irish Lack M.D.   On: 02/24/2021 12:01   DG Chest Portable 1  View  Result Date: 01/27/2021 CLINICAL DATA:  Persistent cough. Recurrent upper respiratory infection EXAM: PORTABLE CHEST 1 VIEW COMPARISON:  Radiograph 06/27/2016 FINDINGS: 1355 hours. The heart size and mediastinal contours are normal. The lungs are clear. There is no pleural effusion or pneumothorax. No acute osseous findings are identified. Telemetry leads overlie the chest. IMPRESSION: No active cardiopulmonary process. Electronically Signed   By: Carey Bullocks M.D.   On: 01/27/2021 14:47   ECHOCARDIOGRAM COMPLETE  Result Date: 02/17/2021    ECHOCARDIOGRAM REPORT   Patient Name:  Zeba Swider Date of Exam: 02/17/2021 Medical Rec #:  [id number]       Height:       65.0 in Accession #:    [id number]      Weight:       240.0 lb Date of Birth:  Dec 06, 1965        BSA:          2.138 m Patient Age:    54 years        BP:           120/60 mmHg Patient Gender: F               HR:           67 bpm. Exam Location:  Inpatient Procedure: 2D Echo Indications:    CHF-Acute Diastolic I50.31  History:        Patient has no prior history of Echocardiogram examinations.  Sonographer:    Thurman Coyer RDCS (AE) Referring Phys: [id number] [place] [facility] IMPRESSIONS  1. Left ventricular ejection fraction, by estimation, is 60%. The left ventricle has normal function. The left ventricle has no regional wall motion abnormalities. There is mild left ventricular hypertrophy. Left ventricular diastolic parameters were low normal.  2. Right ventricular systolic function is normal. The right ventricular size is normal. Tricuspid regurgitation signal is inadequate for assessing PA pressure.  3. The mitral valve is normal in structure. Trivial mitral valve regurgitation. No evidence of mitral stenosis.  4. The aortic valve is tricuspid. Aortic valve regurgitation is not visualized. No aortic stenosis is present.  5. The inferior vena cava is normal in size with greater than 50% respiratory variability, suggesting right atrial  pressure of 3 mmHg. FINDINGS  Left Ventricle: Left ventricular ejection fraction, by estimation, is 60%. The left ventricle has normal function. The left ventricle has no regional wall motion abnormalities. The left ventricular internal cavity size was normal in size. There is mild left ventricular hypertrophy. Left ventricular diastolic parameters were normal. Right Ventricle: The right ventricular size is normal. No increase in right ventricular wall thickness. Right ventricular systolic function is normal. Tricuspid regurgitation signal is inadequate for assessing PA pressure. Left Atrium: Left atrial size was normal in size. Right Atrium: Right atrial size was normal in size. Pericardium: There is no evidence of pericardial effusion. Mitral Valve: The mitral valve is normal in structure. Trivial mitral valve regurgitation. No evidence of mitral valve stenosis. Tricuspid Valve: The tricuspid valve is normal in structure. Tricuspid valve regurgitation is trivial. No evidence of tricuspid stenosis. Aortic Valve: The aortic valve is tricuspid. Aortic valve regurgitation is not visualized. No aortic stenosis is present. Pulmonic Valve: The pulmonic valve was normal in structure. Pulmonic valve regurgitation is trivial. No evidence of pulmonic stenosis. Aorta: The aortic root is normal in size and structure. Venous: The inferior vena cava is normal in size with greater than 50% respiratory variability, suggesting right atrial pressure of 3 mmHg. IAS/Shunts: The interatrial septum was not well visualized.  LEFT VENTRICLE PLAX 2D LVIDd:         4.10 cm  Diastology LVIDs:         2.80 cm  LV e' medial:    6.53 cm/s LV PW:         1.20 cm  LV E/e' medial:  11.0 LV IVS:        1.20 cm  LV e' lateral:   9.57 cm/s LVOT diam:     2.10 cm  LV  E/e' lateral: 7.5 LV SV:         76 LV SV Index:   36 LVOT Area:     3.46 cm  RIGHT VENTRICLE RV S prime:     15.40 cm/s TAPSE (M-mode): 1.4 cm LEFT ATRIUM             Index       RIGHT  ATRIUM          Index LA diam:        3.10 cm 1.45 cm/m  RA Area:     9.08 cm LA Vol (A2C):   27.6 ml 12.91 ml/m RA Volume:   15.80 ml 7.39 ml/m LA Vol (A4C):   39.6 ml 18.53 ml/m LA Biplane Vol: 35.3 ml 16.51 ml/m  AORTIC VALVE LVOT Vmax:   107.00 cm/s LVOT Vmean:  64.800 cm/s LVOT VTI:    0.220 m  AORTA Ao Root diam: 2.80 cm MITRAL VALVE MV Area (PHT): 2.42 cm    SHUNTS MV Decel Time: 313 msec    Systemic VTI:  0.22 m MV E velocity: 71.60 cm/s  Systemic Diam: 2.10 cm MV A velocity: 73.30 cm/s MV E/A ratio:  0.98 Weston Brass MD Electronically signed by Weston Brass MD Signature Date/Time: 02/17/2021/1:13:33 PM    Final    US BIOPSY (KIDNEY)  Result Date: 02/20/2021 INDICATION: 75 year old with acute kidney injury and request for renal biopsy. EXAM: ULTRASOUND-GUIDED RANDOM RENAL BIOPSY MEDICATIONS: Moderate sedation none. ANESTHESIA/SEDATION: Moderate (conscious) sedation was employed during this procedure. A total of Versed 1.0 mg and Fentanyl 50 mcg was administered intravenously. Moderate Sedation Time: 15 minutes. The patient's level of consciousness and vital signs were monitored continuously by radiology nursing throughout the procedure under my direct supervision. FLUOROSCOPY TIME:  None COMPLICATIONS: None immediate. PROCEDURE: Informed written consent was obtained from the patient after a thorough discussion of the procedural risks, benefits and alternatives. All questions were addressed. A timeout was performed prior to the initiation of the procedure. Patient was placed prone. Both kidneys were evaluated with ultrasound. The right kidney was targeted for biopsy. The right flank was prepped with chlorhexidine and sterile field was created. Maximal barrier sterile technique was utilized including caps, mask, sterile gowns, sterile gloves, sterile drape, hand hygiene and skin antiseptic. Skin was anesthetized using 1% lidocaine. A small incision was made. Using ultrasound guidance, 16 gauge  core biopsy was directed into the right kidney lower pole. Adequate specimen was obtained and placed in saline. A second ultrasound-guided core biopsy was obtained from the right kidney lower pole. Adequate specimen was obtained and placed in saline. Bandage placed over the puncture site. FINDINGS: Negative for hydronephrosis. Two core biopsies obtained from the right kidney lower pole. No significant bleeding or hematoma formation. IMPRESSION: Successful ultrasound-guided random core biopsies from the right kidney lower pole. Electronically Signed   By: Richarda Overlie M.D.   On: 02/20/2021 17:20   CT VENOGRAM HEAD  Result Date: 01/27/2021 CLINICAL DATA:  Diplopia.  Dural sinus thrombosis suspected. EXAM: CT ANGIOGRAPHY HEAD CT VENOGRAM HEAD TECHNIQUE: Multidetector CT imaging of the head was performed using the standard protocol during bolus administration of intravenous contrast. Multiplanar CT image reconstructions and MIPs were obtained to evaluate the arterial anatomy. Additional delayed multidetector CT imaging of the head was performed with multiplanar CT image reconstructions and MIPS to evaluate the venous anatomy. CONTRAST:  OMNIPAQUE IOHEXOL 350 MG/ML SOLN COMPARISON:  None. FINDINGS: CT HEAD Brain: No evidence of acute large vascular territory  infarction, acute hemorrhage, hydrocephalus, extra-axial collection or mass lesion/mass effect. Mild narrowing of the mamillopontine distance and effacement of the suprasellar cistern with trace tonsillar ectopia. Skull: No acute fracture. Sinuses: Visualized sinuses are clear. Orbits: No acute finding. CTA HEAD Due to venous timing, significantly limited evaluation. The intradural left vertebral artery is poorly opacified, which could relate to stenosis or non dominant vertebral artery. Otherwise, no evidence of proximal large vessel occlusion or large aneurysm in the anterior or posterior circulation. CT VENOGRAM No evidence of dural sinus thrombosis. Small  left transverse sinus and narrowing of the distal right transverse sinus. Small rounded filling defect in the distal right transverse sinus likely represents arachnoid granulation. IMPRESSION: CT head: 1. No definite evidence of acute intracranial abnormality. 2. Mild narrowing of the mamillopontine distance and effacement of the suprasellar cistern with trace tonsillar ectopia. Given the patient is young and has relative paucity of CSF spaces/sulci diffusely these findings may be within normal limits, but recommend correlation with signs/symptoms of intracranial hypotension including positional headaches. Additionally, per Dr. Rush Landmark the patient is going to get a follow-up MRI which can further evaluate these findings. CTA head: Due to venous timing, significantly limited evaluation. The intradural left vertebral artery is not well opacified, which is poorly evaluated and could relate to nondominant vertebral artery or stenosis. A CTA neck could further evaluate if clinically indicated. Otherwise, no evidence of proximal large vessel occlusion or large aneurysm. A CTA of the neck could further evaluate the vertebral finding and a repeat CTA head could provide more sensitive intracranial arterial evaluation if clinically indicated. CTV: No evidence of dural sinus thrombosis. Findings discussed with Dr. Julieanne Manson at 3:05 PM via telephone. Electronically Signed   By: Feliberto Harts MD   On: 01/27/2021 15:15   US Abdomen Limited RUQ (LIVER/GB)  Result Date: 02/20/2021 CLINICAL DATA:  Transaminitis EXAM: ULTRASOUND ABDOMEN LIMITED RIGHT UPPER QUADRANT COMPARISON:  CT abdomen and pelvis 02/16/2021 FINDINGS: Gallbladder: Normally distended. 6 mm nonshadowing intraluminal echogenic focus likely small polyp; no follow-up imaging recommended. No gallbladder wall thickening, pericholecystic fluid or sonographic Murphy sign. No discrete shadowing calculi. Common bile duct: Diameter: 2 mm Liver: Slightly heterogeneous  echogenicity, nonspecific. No discrete hepatic mass or nodularity. No intrahepatic biliary dilatation. Portal vein is patent on color Doppler imaging with normal direction of blood flow towards the liver. Other: No RIGHT upper quadrant free fluid. IMPRESSION: 6 mm gallbladder polyp; no follow-up imaging recommended. Nonspecific mildly heterogeneous hepatic parenchymal echogenicity without mass or biliary dilatation. Electronically Signed   By: Ulyses Southward M.D.   On: 02/20/2021 18:13

## 2021-02-26 NOTE — Progress Notes (Signed)
[facility] NEPHROLOGY PROGRESS NOTE  Assessment/ Plan: Pt is a 55 y.o. yo female with history of psoriasis, COVID-19 infection, recent diagnosis of HTN restarted HCTZ presented with generalized body pain, shortness of breath and found to have AKI.  Acute kidney injury with nephrotic syndrome, dialysis-dependent: The acute elevation of serum creatinine level to 10 from normal creatinine about a month ago. The urine study with proteinuria around 6g, serum albumin 1.5 and trace edema. No microscopic hematuria or WBC in the first urinalysis.   Repeat urinalysis was after placement of Foley catheter.  Kidney ultrasound without hydronephrosis. The lab result came back with elevated antidsDNA >300 with low C3 and C4 and high ANA titers. 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  Full house staining for immunoglobins and complements as well as tubular basement membrane deposits.  Her GN has low activity and low chronicity. Intense full house staining. Her acute tubular injury is likely related to collapsing glomerulopathy. Final biopsy report pending. -We started solumedrol 250 mg IV on 3/19 for 3 days and now on prednisone 60 mg as of 3/22, plan to taper in 2 weeks, plan to taper to 40mg  on 4/5. Further IS as below -Started dialysis on 3/19 for oliguric AKI and azotemia. RIJ temp HD catheter placed by IR. -TDC placed 3/25 with IR. Holding on consult for [facility] for AVF/AVG since she is an AKI patient -HD on TTS sched  Lupus Nephritis Class 3 -full house staining, low chronicity/low activity -Continue with prednisone 60 mg, plan to taper to 40 mg on 4/5 -started on CellCept, low-dose 25mg  bid on 3/24.  Side effect profile reviewed with patient on 3/24.  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  I did not expect this histopathological finding on her biopsy.  I do wonder if there is genetic component (APOL1 mutation) to this or if this is related to her Covid infection back in January (however this was a presumed diagnosis, was not tested). Interestingly, her father was on dialysis by the age of 9 until he passed in his 10's, cause of his ESRD was unknown and thought to be secondary to HTN, wonder if he had a similar presentation.  Can possibly try for genetic testing at our office if still needed? -Nonetheless this is an overall poor kidney prognostic sign.  She is on steroids for LN which we will continue also for this.  Final biopsy report pending  Hypertension: Blood pressure acceptable.  UF as tolerated  Exertional shortness of breath, improved: Chest x-ray negative.    Echo with EF of 60%.  UF with HD.  Sore throat: Reportedly she was treated with penicillin by her PCP.    ASO titer not elevated.  Transaminitis: work up per primary. improved  Hyponatremia: managing with HD, UF as tolerated  Nausea/vomiting/abd pain -would not expect this to be uremic symptoms still, would expect this to have improved by now given adequate dialysis and stable BUN levels. Suspecting constipation is a culprit -ct a/p w/o contrast pending -bowel regimen per primary service, may just need an enema but please avoid fleet enemas  Subjective: Seen and examined at bedside.  Has not had a bowel movement since last Saturday and she thinks that's the culprit for her abdominal pain, nausea/vomiting, and low appetite which I absolutely agree with. Her  ct a/p is pending however she is not able to drink the PO contrast therefore pushed to today. She tolerated HD yesterday, net uf 2L.   Objective Vital signs in last 24 hours: Vitals:   02/25/21 1643 02/25/21 1711 02/25/21 2008 02/26/21 0501  BP: 106/71 106/71 119/76 (!) 149/89  Pulse: 81 82 95 71  Resp: 18 19 13 16   Temp:  97.6 F (36.4 C) 97.6 F (36.4 C) 98.2 F (36.8 C) 97.9 F (36.6 C)  TempSrc: Oral Oral Oral Oral  SpO2: 97% 97% 100% 99%  Weight: 129.5 kg     Height:       Weight change:   Intake/Output Summary (Last 24 hours) at 02/26/2021 1402 Last data filed at 02/25/2021 1643 Gross per 24 hour  Intake -  Output 2000 ml  Net -2000 ml       Labs: Basic Metabolic Panel: Recent Labs  Lab 02/21/21 1137 02/23/21 0357 02/24/21 0313 02/25/21 0359 02/26/21 0500  NA 131*   < > 134* 135 134*  K 4.8   < > 4.8 4.8 4.6  CL 97*   < > 98 99 97*  CO2 20*   < > 26 26 27   GLUCOSE 144*   < > 145* 121* 207*  BUN 98*   < > 47* 61* 40*  CREATININE 11.78*   < > 8.55* 9.76* 6.59*  CALCIUM 7.9*   < > 7.7* 8.0* 7.7*  PHOS 8.6*  --  7.0*  --   --    < > = values in this interval not displayed.   Liver Function Tests: Recent Labs  Lab 02/24/21 0313 02/25/21 0359 02/26/21 0500  AST 79* 57* 50*  ALT 76* 37 22  ALKPHOS 74 67 67  BILITOT 0.7 0.4 0.5  PROT 6.1* 5.7* 5.5*  ALBUMIN 2.0*  1.9* 1.9* 1.9*   No results for input(s): LIPASE, AMYLASE in the last 168 hours. Recent Labs  Lab 02/25/21 0359  AMMONIA 14   CBC: Recent Labs  Lab 02/20/21 0310 02/21/21 1137 02/24/21 0313 02/25/21 0359 02/26/21 0500  WBC 7.0 9.7 14.4* 9.3 11.2*  NEUTROABS 5.2  --   --   --   --   HGB 10.8* 11.0* 11.0* 10.5* 9.2*  HCT 32.1* 33.8* 33.6* 32.0* 29.2*  MCV 88.7 89.9 90.3 91.7 93.0  PLT 93* 143* 134* 125* 104*   Cardiac Enzymes: No results for input(s): CKTOTAL, CKMB, CKMBINDEX, TROPONINI in the last 168 hours. CBG: No results for input(s): GLUCAP in the last 168 hours.  Iron Studies: No results for input(s): IRON, TIBC, TRANSFERRIN, FERRITIN in the last 72 hours. Studies/Results: CT HEAD WO CONTRAST  Result Date: 02/24/2021 CLINICAL DATA:  Delirium EXAM: CT HEAD WITHOUT CONTRAST TECHNIQUE: Contiguous axial images were obtained from the base of the skull through the vertex without intravenous  contrast. COMPARISON:  01/27/2021 FINDINGS: Brain: There is no acute intracranial hemorrhage, mass effect, or edema. Gray-white differentiation is preserved. There is no extra-axial fluid collection. Ventricles and sulci are within normal limits in size and configuration. Vascular: No hyperdense vessel or unexpected calcification. Skull: Calvarium is unremarkable. Sinuses/Orbits: No acute finding. Other: Partially empty sella. IMPRESSION: No acute intracranial abnormality. Electronically Signed   By: 02/26/2021 M.D.   On: 02/24/2021 15:59    Medications: Infusions:   Scheduled Medications: . bisacodyl  10 mg Oral Once  . Chlorhexidine Gluconate Cloth  6 each Topical Q0600  . famotidine  10 mg Oral Daily  . feeding supplement (  NEPRO CARB STEADY)  237 mL Oral TID BM  . heparin injection (subcutaneous)  5,000 Units Subcutaneous Q8H  . kidney failure book   Does not apply Once  . mycophenolate  250 mg Oral BID  . polyethylene glycol  17 g Oral Daily  . polyethylene glycol  34 g Oral Once  . predniSONE  60 mg Oral Q breakfast   I have reviewed scheduled and prn medications.  Physical Exam: General:NAD Heart:RRR, s1s2 nl Lungs:  no iwob Abdomen:soft, Non-tender, non-distended Extremities: Trace pedal/ankle edema b/l Dialysis Access: Right tunneled HD catheter  Vikas Singh 02/26/2021,2:02 PM  LOS: 10 days

## 2021-02-26 NOTE — Progress Notes (Signed)
Started giving patient contrast for ABD CT

## 2021-02-27 DIAGNOSIS — N179 Acute kidney failure, unspecified: Secondary | ICD-10-CM | POA: Diagnosis not present

## 2021-02-27 DIAGNOSIS — M3214 Glomerular disease in systemic lupus erythematosus: Secondary | ICD-10-CM | POA: Diagnosis not present

## 2021-02-27 DIAGNOSIS — K859 Acute pancreatitis without necrosis or infection, unspecified: Secondary | ICD-10-CM | POA: Diagnosis not present

## 2021-02-27 DIAGNOSIS — R7401 Elevation of levels of liver transaminase levels: Secondary | ICD-10-CM | POA: Diagnosis not present

## 2021-02-27 LAB — COMPREHENSIVE METABOLIC PANEL
ALT: 16 U/L (ref 0–44)
AST: 40 U/L (ref 15–41)
Albumin: 2 g/dL — ABNORMAL LOW (ref 3.5–5.0)
Alkaline Phosphatase: 63 U/L (ref 38–126)
Anion gap: 8 (ref 5–15)
BUN: 62 mg/dL — ABNORMAL HIGH (ref 6–20)
CO2: 27 mmol/L (ref 22–32)
Calcium: 8 mg/dL — ABNORMAL LOW (ref 8.9–10.3)
Chloride: 99 mmol/L (ref 98–111)
Creatinine, Ser: 6.78 mg/dL — ABNORMAL HIGH (ref 0.44–1.00)
GFR, Estimated: 7 mL/min — ABNORMAL LOW (ref >60)
Glucose, Bld: 154 mg/dL — ABNORMAL HIGH (ref 70–99)
Potassium: 4.5 mmol/L (ref 3.5–5.1)
Sodium: 134 mmol/L — ABNORMAL LOW (ref 135–145)
Total Bilirubin: 0.7 mg/dL (ref 0.3–1.2)
Total Protein: 5.7 g/dL — ABNORMAL LOW (ref 6.5–8.1)

## 2021-02-27 LAB — CBC
HCT: 28 % — ABNORMAL LOW (ref 36.0–46.0)
Hemoglobin: 9.1 g/dL — ABNORMAL LOW (ref 12.0–15.0)
MCH: 30.1 pg (ref 26.0–34.0)
MCHC: 32.5 g/dL (ref 30.0–36.0)
MCV: 92.7 fL (ref 80.0–100.0)
Platelets: 125 10*3/uL — ABNORMAL LOW (ref 150–400)
RBC: 3.02 MIL/uL — ABNORMAL LOW (ref 3.87–5.11)
RDW: 13.9 % (ref 11.5–15.5)
WBC: 12.3 10*3/uL — ABNORMAL HIGH (ref 4.0–10.5)
nRBC: 0.2 % (ref 0.0–0.2)

## 2021-02-27 LAB — LIPID PANEL
Cholesterol: 270 mg/dL — ABNORMAL HIGH (ref 0–200)
HDL: 42 mg/dL (ref >40)
LDL Cholesterol: 187 mg/dL — ABNORMAL HIGH (ref 0–99)
Total CHOL/HDL Ratio: 6.4 RATIO
Triglycerides: 203 mg/dL — ABNORMAL HIGH (ref <150)
VLDL: 41 mg/dL — ABNORMAL HIGH (ref 0–40)

## 2021-02-27 LAB — LIPASE, BLOOD: Lipase: 165 U/L — ABNORMAL HIGH (ref 11–51)

## 2021-02-27 MED ORDER — POLYETHYLENE GLYCOL 3350 17 G PO PACK
17.0000 g | PACK | Freq: Two times a day (BID) | ORAL | Status: DC
  Administered 2021-03-01: 17 g via ORAL
  Filled 2021-02-27 (×2): qty 1

## 2021-02-27 NOTE — Progress Notes (Signed)
PT Cancellation Note  Patient Details Name: Denise Watson MRN: [id number] DOB: [birth year]   Cancelled Treatment:    Reason Eval/Treat Not Completed: Fatigue/lethargy limiting ability to participate this afternoon. The pt states she has been unable to recover after OT session this morning, requesting PT returns tomorrow morning to allow her time to rest prior to mobility.   Rolm Baptise, PT, DPT   Acute Rehabilitation Department Pager #: [phone]   [name] 02/27/2021, 4:45 PM

## 2021-02-27 NOTE — Progress Notes (Signed)
Patient has been accepted by the Medical Director in [facility] and will be given a TTS schedule, however, given her dx of AKI, we must await financial clearance to ensure coverage of her private insurance--done by [employer] financial team. Navigator has asked that she call when her husband brings in her purse, which has her insurance card in it. Navigator asked her to ask a staff person to make a copy of her insurance card if she does not want her husband to leave it here with her for Navigator to copy tomorrow (if husband will not be here before 4:30pm today). Navigator left phone number with patient.  Navigator will continue to follow closely.   Barbee Shropshire, [place] [facility] Navigator [phone]

## 2021-02-27 NOTE — Progress Notes (Signed)
PROGRESS NOTE                                                                                                                                                                                                             Patient Demographics:    Denise Watson, is a 55 y.o. female, DOB - [birth year], ZOX:[id number]  Outpatient Primary MD for the patient is Gillian Scarce    LOS - 11  Admit date - 02/16/2021    Chief Complaint  Patient presents with  . Shortness of Breath       Brief Narrative Denise Watson  is a 55 y.o. female, who is in relatively good health except recent diagnosis of essential hypertension requiring HCTZ for the last few weeks, obesity, COVID-19 infection in January, psoriasis, C-section in the past, intermittent palpitations in the past.  Apparently patient has been having symptoms of sore throat for the last 2 weeks and subsequently has been developing gradual fatigue and some exertional shortness of breath, denies any fever chills, no chest or abdominal pain, no blood in stool or urine, no cough or sinus pressure, no swelling in arms or legs, she does have petechial rash but she says this is due to her psoriasis and is chronic.  She presented to the ER where she was found to have AKI with a creatinine of 10, nephrology was consulted and I was requested to admit the patient.  Currently review of systems positive for sore throat, fatigue, exertional shortness of breath, chronic petechial rash due to psoriasis according to the patient.     Subjective:   Patient today reports abdominal pain has improved, still reports constipation, she denies any dyspnea, no fever, no chills.     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 -TDC right IJ placed by IR 3/25. -started on cellcept -He is with some altered mental status, CT head with no acute findings, has improved after stopping tramadol  Acute pancreatitis -Complaining of abdominal pain for last 4 days, nausea, had her appetite as well, CT abdomen pelvis 3/27 significant for acute pancreatitis (not present on admission CT) -Continue to trend lipase level. -Triglycerides within normal  limit -No evidence of cholelithiasis on imaging on admission ultrasound or CT, but given new onset will proceed with MRCP. -Check IgG4. -NG tube clear liquid diet, started on as needed oxycodone for pain. -CellCept may contribute to acute pancreatitis, consider as a causative agent if rest of work-up is negative.  Petechial rash.  - Patient claims this is chronic due to psoriasis but could be due to platelet dysfunction caused by AKI.  Will monitor.  Platelet counts are stable pressure stable.  Borderline hyperkalemia.  - Stable after kayaxalate and HD.  Exertional shortness of breath -  likely due to AKI, stable TTE.  Significantly improved after starting hemodialysis  Obesity.  BMI 39.  Follow with PCP.  Hypertension.  Gentle IV hydralazine, will avoid hypotension due to AKI.  Pharyngitis.  Strep throat culture negative, strep urinary antigen negative, ASO titer negative, likely viral pharyngitis  History of psoriasis.  Question if she has discoid lupus.  Outpatient dermatology follow-up.  Asymptomatic mild rise in LFTs.  Would be due to hepatic congestion along with acute hepatitis panel negative, she is symptom-free.Stable RUQ [place] with incidental gallbladder polyp, outpatient follow-up with PCP.  LFTs trending down.       Condition -   Guarded  Family Communication  : Discussed with husband at bedside 3/27.  Code Status :  Full  Consults  :  Renal  PUD Prophylaxis :    Procedures  :     RUQ US - GB Polyp  R. Kidney biopsy  02/20/21  R.IJ HD Cath by IR 02/18/21  Renal US - medical renal disease.  TTE - 1. Left ventricular ejection fraction, by estimation, is 60%. The left ventricle has normal function. The left ventricle has no regional wall motion abnormalities. There is mild left ventricular hypertrophy. Left ventricular diastolic parameters were low normal.  2. Right ventricular systolic function is normal. The right ventricular size is normal. Tricuspid regurgitation signal is inadequate for assessing PA pressure.  3. The mitral valve is normal in structure. Trivial mitral valve regurgitation. No evidence of mitral stenosis.  4. The aortic valve is tricuspid. Aortic valve regurgitation is not visualized. No aortic stenosis is present.  5. The inferior vena cava is normal in size with greater than 50% respiratory variability, suggesting right atrial pressure of 3 mmHg.       Disposition Plan  :    Status is: Inpatient  Remains inpatient appropriate because:IV treatments appropriate due to intensity of illness or inability to take PO   Dispo: The patient is from: Home              Anticipated d/c is to: Home              Patient currently is not medically stable to d/c.   Difficult to place patient No  DVT Prophylaxis  :  Heparin   Lab Results  Component Value Date   PLT 125 (L) 02/27/2021    Diet :  Diet Order            Diet clear liquid Room service appropriate? Yes; Fluid consistency: Thin  Diet effective now                  Inpatient Medications  Scheduled Meds: . Chlorhexidine Gluconate Cloth  6 each Topical Q0600  . famotidine  10 mg Oral Daily  . feeding supplement (NEPRO CARB STEADY)  237 mL Oral TID BM  . heparin injection (subcutaneous)  5,000 Units Subcutaneous Q8H  .  kidney failure book   Does not apply Once  . mycophenolate  250 mg Oral BID  . polyethylene glycol  17 g Oral BID  . predniSONE  60 mg Oral Q breakfast   Continuous Infusions:  PRN Meds:.acetaminophen  **OR** [DISCONTINUED] acetaminophen, alum & mag hydroxide-simeth, bisacodyl, docusate sodium, hydrALAZINE, [DISCONTINUED] ondansetron **OR** ondansetron (ZOFRAN) IV, ondansetron, phenol, sodium chloride flush, traMADol, traZODone  Antibiotics  :    Anti-infectives (From admission, onward)   Start     Dose/Rate Route Frequency Ordered Stop   02/24/21 1400  ceFAZolin (ANCEF) 3 g in dextrose 5 % 50 mL IVPB  Status:  Discontinued        3 g 100 mL/hr over 30 Minutes Intravenous To Radiology 02/23/21 1350 02/24/21 1036   02/24/21 1045  ceFAZolin (ANCEF) 3 g in dextrose 5 % 50 mL IVPB        3 g 100 mL/hr over 30 Minutes Intravenous To Radiology 02/24/21 1036 02/24/21 1040       Tiquan Bouch M.D on 02/27/2021 at 2:50 PM  To page go to [email or web address]   [facility] -  Office  [phone]   See all Orders from today for further details    Objective:   Vitals:   02/25/21 2008 02/26/21 0501 02/26/21 2059 02/27/21 1344  BP: 119/76 (!) 149/89 (!) 147/79 (!) 143/74  Pulse: 95 71 70 75  Resp: Temp: 98.2 F (36.8 C) 97.9 F (36.6 C) 98.5 F (36.9 C) 98.6 F (37 C)  TempSrc: Oral Oral Oral Oral  SpO2: 100% 99% 98% 99%  Weight:      Height:        Wt Readings from Last 3 Encounters:  02/25/21 129.5 kg  01/27/21 108.9 kg  06/27/16 132 kg     Intake/Output Summary (Last 24 hours) at 02/27/2021 1450 Last data filed at 02/27/2021 0422 Gross per 24 hour  Intake --  Output 325 ml  Net -325 ml     Physical Exam  Awake Alert, Oriented X 3, No new F.N deficits, Normal affect Symmetrical Chest wall movement, Good air movement bilaterally, CTAB RRR,No Gallops,Rubs or new Murmurs, No Parasternal Heave +ve B.Sounds, Abd Soft, tenderness to palpation significantly subsided, no rebound - guarding or rigidity. No Cyanosis, Clubbing or edema, No new Rash or bruise        Data Review:    CBC Recent Labs  Lab 02/21/21 1137 02/24/21 0313 02/25/21 0359  02/26/21 0500 02/27/21 0420  WBC 9.7 14.4* 9.3 11.2* 12.3*  HGB 11.0* 11.0* 10.5* 9.2* 9.1*  HCT 33.8* 33.6* 32.0* 29.2* 28.0*  PLT 143* 134* 125* 104* 125*  MCV 89.9 90.3 91.7 93.0 92.7  MCH 29.3 29.6 30.1 29.3 30.1  MCHC 32.5 32.7 32.8 31.5 32.5  RDW 13.8 13.8 14.0 13.9 13.9    Recent Labs  Lab 02/23/21 0357 02/24/21 0313 02/25/21 0359 02/26/21 0500 02/27/21 0420  NA 130* 134* 135 134* 134*  K 5.0 4.8 4.8 4.6 4.5  CL 96* 98 99 97* 99  CO2 GLUCOSE 171* 145* 121* 207* 154*  BUN 73* 47* 61* 40* 62*  CREATININE 10.72* 8.55* 9.76* 6.59* 6.78*  CALCIUM 7.6* 7.7* 8.0* 7.7* 8.0*  AST 106* 79* 57* 50* 40  ALT 94* 76* 37 22 16  ALKPHOS 75 74 67 67 63  BILITOT 0.6 0.7 0.4 0.5 0.7  ALBUMIN 1.9* 2.0*  1.9* 1.9* 1.9* 2.0*  AMMONIA  --   --  14  --   --     ------------------------------------------------------------------------------------------------------------------ Recent Labs    02/27/21 0420  CHOL 270*  HDL 42  LDLCALC 187*  TRIG 203*  CHOLHDL 6.4    Lab Results  Component Value Date   HGBA1C 6.3 (H) 02/16/2021   ------------------------------------------------------------------------------------------------------------------ No results for input(s): TSH, T4TOTAL, T3FREE, THYROIDAB in the last 72 hours.  Invalid input(s): FREET3  Cardiac Enzymes No results for input(s): CKMB, TROPONINI, MYOGLOBIN in the last 168 hours.  Invalid input(s): CK ------------------------------------------------------------------------------------------------------------------    Component Value Date/Time   BNP 34.5 02/20/2021 0310    Micro Results No results found for this or any previous visit (from the past 240 hour(s)).  Radiology Reports CT ABDOMEN PELVIS WO CONTRAST  Result Date: 02/26/2021 CLINICAL DATA:  Abdominal pain. EXAM: CT ABDOMEN AND PELVIS WITHOUT CONTRAST TECHNIQUE: Multidetector CT imaging of the abdomen and pelvis was performed following  the standard protocol without IV contrast. COMPARISON:  02/16/2021 FINDINGS: Lower chest: No acute abnormality. Hepatobiliary: No solid liver abnormality is seen. No gallstones, gallbladder wall thickening, or biliary dilatation. Pancreas: Very extensive inflammatory fat stranding and fluid about the pancreas, adjacent retroperitoneum, and mesentery. There is no obvious focal fluid collection on noncontrast examination. Spleen: Normal in size without significant abnormality. Adrenals/Urinary Tract: Adrenal glands are unremarkable. Kidneys are normal, without renal calculi, solid lesion, or hydronephrosis. Bladder is unremarkable. Stomach/Bowel: Stomach is within normal limits. Appendix appears normal. No evidence of bowel wall thickening, distention, or inflammatory changes. Vascular/Lymphatic: No significant vascular findings are present. No enlarged abdominal or pelvic lymph nodes. Reproductive: No mass or other significant abnormality. Other: No abdominal wall hernia or abnormality. No abdominopelvic ascites. Musculoskeletal: No acute or significant osseous findings. IMPRESSION: Very extensive inflammatory fat stranding and fluid about the pancreas, adjacent retroperitoneum, and mesentery, consistent with acute pancreatitis. No obvious focal fluid collection or evidence of necrosis on noncontrast examination. Electronically Signed   By: Lauralyn PrimesAlex  Bibbey M.D.   On: 02/26/2021 15:42   CT ABDOMEN PELVIS WO CONTRAST  Result Date: 02/16/2021 CLINICAL DATA:  Abdominal pain. EXAM: CT ABDOMEN AND PELVIS WITHOUT CONTRAST TECHNIQUE: Multidetector CT imaging of the abdomen and pelvis was performed following the standard protocol without IV contrast. COMPARISON:  None. FINDINGS: Lower chest: No acute abnormality. Hepatobiliary: No focal liver abnormality is seen. No gallstones, gallbladder wall thickening, or biliary dilatation. Pancreas: Unremarkable. No pancreatic ductal dilatation or surrounding inflammatory changes.  Spleen: Normal in size without focal abnormality. Adrenals/Urinary Tract: Adrenal glands appear normal. No hydronephrosis or renal obstruction is noted. No renal or ureteral calculi are noted. Urinary bladder is decompressed secondary to Foley catheter. Minimal inflammatory changes are noted around the right kidney suggesting possible pyelonephritis. Stomach/Bowel: Stomach is within normal limits. Appendix appears normal. No evidence of bowel wall thickening, distention, or inflammatory changes. Vascular/Lymphatic: No significant vascular findings are present. No enlarged abdominal or pelvic lymph nodes. Reproductive: No adnexal abnormality is noted. Probable small exophytic uterine fibroid is noted arising posteriorly from the uterus. Other: No abdominal wall hernia or abnormality. No abdominopelvic ascites. Musculoskeletal: No acute or significant osseous findings. IMPRESSION: 1. Minimal inflammatory changes are noted around the right kidney suggesting possible pyelonephritis. 2. No hydronephrosis or renal obstruction is noted. No renal or ureteral calculi are noted. 3. Probable small exophytic uterine fibroid is noted arising posteriorly from the uterus. Electronically Signed   By: Lupita RaiderJames  Green Jr M.D.   On: 02/16/2021 18:32   DG Chest 2 View  Result Date: 02/16/2021 CLINICAL DATA:  Shortness of breath. EXAM: CHEST - 2 VIEW COMPARISON:  January 27, 2021. FINDINGS: The heart size and mediastinal contours are within normal limits. Both lungs are clear. No pneumothorax or pleural effusion is noted. The visualized skeletal structures are unremarkable. IMPRESSION: No active cardiopulmonary disease. Electronically Signed   By: Lupita Raider M.D.   On: 02/16/2021 12:53   CT HEAD WO CONTRAST  Result Date: 02/24/2021 CLINICAL DATA:  Delirium EXAM: CT HEAD WITHOUT CONTRAST TECHNIQUE: Contiguous axial images were obtained from the base of the skull through the vertex without intravenous contrast. COMPARISON:   01/27/2021 FINDINGS: Brain: There is no acute intracranial hemorrhage, mass effect, or edema. Gray-white differentiation is preserved. There is no extra-axial fluid collection. Ventricles and sulci are within normal limits in size and configuration. Vascular: No hyperdense vessel or unexpected calcification. Skull: Calvarium is unremarkable. Sinuses/Orbits: No acute finding. Other: Partially empty sella. IMPRESSION: No acute intracranial abnormality. Electronically Signed   By: Guadlupe Spanish M.D.   On: 02/24/2021 15:59   US RENAL  Result Date: 02/21/2021 CLINICAL DATA:  Acute renal insufficiency EXAM: RENAL / URINARY TRACT ULTRASOUND COMPLETE COMPARISON:  02/20/2021 right upper quadrant ultrasound. Renal ultrasound 02/16/2021. FINDINGS: Right Kidney: Renal measurements: 12.3 by 6.0 x 5.5 cm = volume: 213 mL. Increased renal echogenicity. No hydronephrosis. Left Kidney: Renal measurements: 10.0 x 5.3 x 4.4 cm = volume: 128 mL. Increased renal echogenicity. No hydronephrosis. Bladder: Collapsed around a Foley catheter. Other: None. IMPRESSION: No hydronephrosis. Increased renal echogenicity, suggesting medical renal disease. Electronically Signed   By: Jeronimo Greaves M.D.   On: 02/21/2021 21:36   US Renal  Result Date: 02/16/2021 CLINICAL DATA:  Renal failure EXAM: RENAL / URINARY TRACT ULTRASOUND COMPLETE COMPARISON:  CT 02/16/2021 FINDINGS: Right Kidney: Renal measurements: 11.9 x 5.8 x 4.4 cm = volume: 159.2 mL. Cortex is echogenic. No mass or hydronephrosis. Trace perinephric fluid Left Kidney: Renal measurements: 9.7 x 5.2 x 4.9 cm = volume: 129.5 mL. Cortex is echogenic. No mass or hydronephrosis. Bladder: Empty by Foley catheter. Other: None. IMPRESSION: Echogenic kidneys bilaterally consistent with medical renal disease. No hydronephrosis Electronically Signed   By: Jasmine Pang M.D.   On: 02/16/2021 20:23   IR Fluoro Guide CV Line Right  Result Date: 02/24/2021 CLINICAL DATA:  Renal failure and  need for hemodialysis. The patient currently has a non tunneled temporary dialysis catheter via the right internal jugular vein and requires a tunneled dialysis catheter for longer-term hemodialysis. EXAM: TUNNELED CENTRAL VENOUS HEMODIALYSIS CATHETER PLACEMENT WITH ULTRASOUND AND FLUOROSCOPIC GUIDANCE ANESTHESIA/SEDATION: 1.0 mg IV Versed; 50 mcg IV Fentanyl. Total Moderate Sedation Time:   25 minutes. The patient's level of consciousness and physiologic status were continuously monitored during the procedure by Radiology nursing. MEDICATIONS: 3 g IV Ancef. FLUOROSCOPY TIME:  24 seconds.  2.5 mGy. PROCEDURE: The procedure, risks, benefits, and alternatives were explained to the patient. Questions regarding the procedure were encouraged and answered. The patient understands and consents to the procedure. A timeout was performed prior to initiating the procedure. The pre-existing non tunneled right jugular temporary dialysis catheter was removed and hemostasis obtained with manual compression over the exit site. Ultrasound was used to confirm patency of the right internal jugular vein after non tunneled catheter removal. The right neck and chest were prepped with chlorhexidine in a sterile fashion, and a sterile drape was applied covering the operative field. Maximum barrier sterile technique with sterile gowns and gloves were used for the procedure. Local anesthesia was provided with  1% lidocaine. After creating a small venotomy incision, a 21 gauge needle was advanced into the right internal jugular vein under direct, real-time ultrasound guidance. Ultrasound image documentation was performed. After securing guidewire access, an 8 Fr dilator was placed. A J-wire was kinked to measure appropriate catheter length. A Palindrome tunneled hemodialysis catheter measuring 19 cm from tip to cuff was chosen for placement. This was tunneled in a retrograde fashion from the chest wall to the venotomy incision. At the  venotomy, serial dilatation was performed and a 15 Fr peel-away sheath was placed over a guidewire. The catheter was then placed through the sheath and the sheath removed. Final catheter positioning was confirmed and documented with a fluoroscopic spot image. The catheter was aspirated, flushed with saline, and injected with appropriate volume heparin dwells. The venotomy incision was closed with subcuticular 4-0 Vicryl. Dermabond was applied to the incision. The catheter exit site was secured with 0-Prolene retention sutures. COMPLICATIONS: None.  No pneumothorax. FINDINGS: After catheter placement, the tip lies in the right atrium. The catheter aspirates normally and is ready for immediate use. IMPRESSION: Placement of tunneled hemodialysis catheter via the right internal jugular vein. The catheter tip lies in the right atrium. The catheter is ready for immediate use. Electronically Signed   By: Irish Lack M.D.   On: 02/24/2021 12:01   IR Fluoro Guide CV Line Right  Result Date: 02/18/2021 INDICATION: 55 year old female referred for temporary hemodialysis catheter placement EXAM: IMAGE GUIDED PLACEMENT OF TEMPORARY HEMODIALYSIS CATHETER MEDICATIONS: None ANESTHESIA/SEDATION: Moderate (conscious) sedation was employed during this procedure. A total of Versed 1.0 mg and Fentanyl 50 mcg was administered intravenously. Moderate Sedation Time: 13 minutes. The patient's level of consciousness and vital signs were monitored continuously by radiology nursing throughout the procedure under my direct supervision. FLUOROSCOPY TIME:  Fluoroscopy Time: 0 minutes 6 seconds (1 mGy). COMPLICATIONS: None PROCEDURE: Informed written consent was obtained from the patient and the patient's family after a thorough discussion of the procedural risks, benefits and alternatives. All questions were addressed. A timeout was performed prior to the initiation of the procedure. The right neck and chest was prepped with chlorhexidine,  and draped in the usual sterile fashion using maximum barrier technique (cap and mask, sterile gown, sterile gloves, large sterile sheet, hand hygiene and cutaneous antiseptic). Local anesthesia was attained by infiltration with 1% lidocaine without epinephrine. Ultrasound demonstrated patency of the right internal jugular vein, and this was documented with an image. Under real-time ultrasound guidance, this vein was accessed with a 21 gauge micropuncture needle and image documentation was performed. A small dermatotomy was made at the access site with an 11 scalpel. A 0.018" wire was advanced into the SVC and the access needle exchanged for a 60F micropuncture vascular sheath. The 0.018" wire was then removed and a 0.035" wire advanced into the IVC. Upon withdrawal of the 018 wire, the wire was marked for appropriate length of the internal portion of the catheter. A 16 cm catheter was selected. Skin and subcutaneous tissues were serially dilated. Catheter was placed on the wire. The catheter tip is positioned in the upper right atrium. This was documented with a spot image. Both ports of the hemodialysis catheter were then tested for excellent function. The ports were then locked with heparinized lock. Patient tolerated the procedure well and remained hemodynamically stable throughout. No complications were encountered and no significant blood loss was encountered. IMPRESSION: Status post image guided placement of temporary hemodialysis catheter. Catheter may be converted if  needed. Signed, Yvone Neu. Reyne Dumas, RPVI [facility] Electronically Signed   By: Gilmer Mor D.O.   On: 02/18/2021 14:21   IR US Guide Vasc Access Right  Result Date: 02/18/2021 INDICATION: 55 year old female referred for temporary hemodialysis catheter placement EXAM: IMAGE GUIDED PLACEMENT OF TEMPORARY HEMODIALYSIS CATHETER MEDICATIONS: None ANESTHESIA/SEDATION: Moderate  (conscious) sedation was employed during this procedure. A total of Versed 1.0 mg and Fentanyl 50 mcg was administered intravenously. Moderate Sedation Time: 13 minutes. The patient's level of consciousness and vital signs were monitored continuously by radiology nursing throughout the procedure under my direct supervision. FLUOROSCOPY TIME:  Fluoroscopy Time: 0 minutes 6 seconds (1 mGy). COMPLICATIONS: None PROCEDURE: Informed written consent was obtained from the patient and the patient's family after a thorough discussion of the procedural risks, benefits and alternatives. All questions were addressed. A timeout was performed prior to the initiation of the procedure. The right neck and chest was prepped with chlorhexidine, and draped in the usual sterile fashion using maximum barrier technique (cap and mask, sterile gown, sterile gloves, large sterile sheet, hand hygiene and cutaneous antiseptic). Local anesthesia was attained by infiltration with 1% lidocaine without epinephrine. Ultrasound demonstrated patency of the right internal jugular vein, and this was documented with an image. Under real-time ultrasound guidance, this vein was accessed with a 21 gauge micropuncture needle and image documentation was performed. A small dermatotomy was made at the access site with an 11 scalpel. A 0.018" wire was advanced into the SVC and the access needle exchanged for a 6F micropuncture vascular sheath. The 0.018" wire was then removed and a 0.035" wire advanced into the IVC. Upon withdrawal of the 018 wire, the wire was marked for appropriate length of the internal portion of the catheter. A 16 cm catheter was selected. Skin and subcutaneous tissues were serially dilated. Catheter was placed on the wire. The catheter tip is positioned in the upper right atrium. This was documented with a spot image. Both ports of the hemodialysis catheter were then tested for excellent function. The ports were then locked with  heparinized lock. Patient tolerated the procedure well and remained hemodynamically stable throughout. No complications were encountered and no significant blood loss was encountered. IMPRESSION: Status post image guided placement of temporary hemodialysis catheter. Catheter may be converted if needed. Signed, Yvone Neu. Reyne Dumas, RPVI [facility] Radiology Electronically Signed   By: Gilmer Mor D.O.   On: 02/18/2021 14:21   IR US Guidance  Result Date: 02/24/2021 CLINICAL DATA:  Renal failure and need for hemodialysis. The patient currently has a non tunneled temporary dialysis catheter via the right internal jugular vein and requires a tunneled dialysis catheter for longer-term hemodialysis. EXAM: TUNNELED CENTRAL VENOUS HEMODIALYSIS CATHETER PLACEMENT WITH ULTRASOUND AND FLUOROSCOPIC GUIDANCE ANESTHESIA/SEDATION: 1.0 mg IV Versed; 50 mcg IV Fentanyl. Total Moderate Sedation Time:   25 minutes. The patient's level of consciousness and physiologic status were continuously monitored during the procedure by Radiology nursing. MEDICATIONS: 3 g IV Ancef. FLUOROSCOPY TIME:  24 seconds.  2.5 mGy. PROCEDURE: The procedure, risks, benefits, and alternatives were explained to the patient. Questions regarding the procedure were encouraged and answered. The patient understands and consents to the procedure. A timeout was performed prior to initiating the procedure. The pre-existing non tunneled right jugular temporary dialysis catheter was removed and hemostasis obtained with manual compression over the exit site. Ultrasound was used to confirm patency of the right internal jugular vein  after non tunneled catheter removal. The right neck and chest were prepped with chlorhexidine in a sterile fashion, and a sterile drape was applied covering the operative field. Maximum barrier sterile technique with sterile gowns and gloves were used for the procedure. Local anesthesia  was provided with 1% lidocaine. After creating a small venotomy incision, a 21 gauge needle was advanced into the right internal jugular vein under direct, real-time ultrasound guidance. Ultrasound image documentation was performed. After securing guidewire access, an 8 Fr dilator was placed. A J-wire was kinked to measure appropriate catheter length. A Palindrome tunneled hemodialysis catheter measuring 19 cm from tip to cuff was chosen for placement. This was tunneled in a retrograde fashion from the chest wall to the venotomy incision. At the venotomy, serial dilatation was performed and a 15 Fr peel-away sheath was placed over a guidewire. The catheter was then placed through the sheath and the sheath removed. Final catheter positioning was confirmed and documented with a fluoroscopic spot image. The catheter was aspirated, flushed with saline, and injected with appropriate volume heparin dwells. The venotomy incision was closed with subcuticular 4-0 Vicryl. Dermabond was applied to the incision. The catheter exit site was secured with 0-Prolene retention sutures. COMPLICATIONS: None.  No pneumothorax. FINDINGS: After catheter placement, the tip lies in the right atrium. The catheter aspirates normally and is ready for immediate use. IMPRESSION: Placement of tunneled hemodialysis catheter via the right internal jugular vein. The catheter tip lies in the right atrium. The catheter is ready for immediate use. Electronically Signed   By: Irish Lack M.D.   On: 02/24/2021 12:01   ECHOCARDIOGRAM COMPLETE  Result Date: 02/17/2021    ECHOCARDIOGRAM REPORT   Patient Name:   NARISSA BEAUFORT Date of Exam: 02/17/2021 Medical Rec #:  [id number]       Height:       65.0 in Accession #:    [id number]      Weight:       240.0 lb Date of Birth:  1966/03/05        BSA:          2.138 m Patient Age:    54 years        BP:           120/60 mmHg Patient Gender: F               HR:           67 bpm. Exam Location:  Inpatient  Procedure: 2D Echo Indications:    CHF-Acute Diastolic I50.31  History:        Patient has no prior history of Echocardiogram examinations.  Sonographer:    Thurman Coyer RDCS (AE) Referring Phys: [id number] [facility] IMPRESSIONS  1. Left ventricular ejection fraction, by estimation, is 60%. The left ventricle has normal function. The left ventricle has no regional wall motion abnormalities. There is mild left ventricular hypertrophy. Left ventricular diastolic parameters were low normal.  2. Right ventricular systolic function is normal. The right ventricular size is normal. Tricuspid regurgitation signal is inadequate for assessing PA pressure.  3. The mitral valve is normal in structure. Trivial mitral valve regurgitation. No evidence of mitral stenosis.  4. The aortic valve is tricuspid. Aortic valve regurgitation is not visualized. No aortic stenosis is present.  5. The inferior vena cava is normal in size with greater than 50% respiratory variability, suggesting right atrial pressure of 3 mmHg. FINDINGS  Left Ventricle: Left ventricular ejection fraction, by estimation,  is 60%. The left ventricle has normal function. The left ventricle has no regional wall motion abnormalities. The left ventricular internal cavity size was normal in size. There is mild left ventricular hypertrophy. Left ventricular diastolic parameters were normal. Right Ventricle: The right ventricular size is normal. No increase in right ventricular wall thickness. Right ventricular systolic function is normal. Tricuspid regurgitation signal is inadequate for assessing PA pressure. Left Atrium: Left atrial size was normal in size. Right Atrium: Right atrial size was normal in size. Pericardium: There is no evidence of pericardial effusion. Mitral Valve: The mitral valve is normal in structure. Trivial mitral valve regurgitation. No evidence of mitral valve stenosis. Tricuspid Valve: The tricuspid valve is normal in structure. Tricuspid  valve regurgitation is trivial. No evidence of tricuspid stenosis. Aortic Valve: The aortic valve is tricuspid. Aortic valve regurgitation is not visualized. No aortic stenosis is present. Pulmonic Valve: The pulmonic valve was normal in structure. Pulmonic valve regurgitation is trivial. No evidence of pulmonic stenosis. Aorta: The aortic root is normal in size and structure. Venous: The inferior vena cava is normal in size with greater than 50% respiratory variability, suggesting right atrial pressure of 3 mmHg. IAS/Shunts: The interatrial septum was not well visualized.  LEFT VENTRICLE PLAX 2D LVIDd:         4.10 cm  Diastology LVIDs:         2.80 cm  LV e' medial:    6.53 cm/s LV PW:         1.20 cm  LV E/e' medial:  11.0 LV IVS:        1.20 cm  LV e' lateral:   9.57 cm/s LVOT diam:     2.10 cm  LV E/e' lateral: 7.5 LV SV:         76 LV SV Index:   36 LVOT Area:     3.46 cm  RIGHT VENTRICLE RV S prime:     15.40 cm/s TAPSE (M-mode): 1.4 cm LEFT ATRIUM             Index       RIGHT ATRIUM          Index LA diam:        3.10 cm 1.45 cm/m  RA Area:     9.08 cm LA Vol (A2C):   27.6 ml 12.91 ml/m RA Volume:   15.80 ml 7.39 ml/m LA Vol (A4C):   39.6 ml 18.53 ml/m LA Biplane Vol: 35.3 ml 16.51 ml/m  AORTIC VALVE LVOT Vmax:   107.00 cm/s LVOT Vmean:  64.800 cm/s LVOT VTI:    0.220 m  AORTA Ao Root diam: 2.80 cm MITRAL VALVE MV Area (PHT): 2.42 cm    SHUNTS MV Decel Time: 313 msec    Systemic VTI:  0.22 m MV E velocity: 71.60 cm/s  Systemic Diam: 2.10 cm MV A velocity: 73.30 cm/s MV E/A ratio:  0.98 Weston Brass MD Electronically signed by Weston Brass MD Signature Date/Time: 02/17/2021/1:13:33 PM    Final    US BIOPSY (KIDNEY)  Result Date: 02/20/2021 INDICATION: 55 year old with acute kidney injury and request for renal biopsy. EXAM: ULTRASOUND-GUIDED RANDOM RENAL BIOPSY MEDICATIONS: Moderate sedation none. ANESTHESIA/SEDATION: Moderate (conscious) sedation was employed during this procedure. A total  of Versed 1.0 mg and Fentanyl 50 mcg was administered intravenously. Moderate Sedation Time: 15 minutes. The patient's level of consciousness and vital signs were monitored continuously by radiology nursing throughout the procedure under my direct supervision. FLUOROSCOPY TIME:  None  COMPLICATIONS: None immediate. PROCEDURE: Informed written consent was obtained from the patient after a thorough discussion of the procedural risks, benefits and alternatives. All questions were addressed. A timeout was performed prior to the initiation of the procedure. Patient was placed prone. Both kidneys were evaluated with ultrasound. The right kidney was targeted for biopsy. The right flank was prepped with chlorhexidine and sterile field was created. Maximal barrier sterile technique was utilized including caps, mask, sterile gowns, sterile gloves, sterile drape, hand hygiene and skin antiseptic. Skin was anesthetized using 1% lidocaine. A small incision was made. Using ultrasound guidance, 16 gauge core biopsy was directed into the right kidney lower pole. Adequate specimen was obtained and placed in saline. A second ultrasound-guided core biopsy was obtained from the right kidney lower pole. Adequate specimen was obtained and placed in saline. Bandage placed over the puncture site. FINDINGS: Negative for hydronephrosis. Two core biopsies obtained from the right kidney lower pole. No significant bleeding or hematoma formation. IMPRESSION: Successful ultrasound-guided random core biopsies from the right kidney lower pole. Electronically Signed   By: Richarda Overlie M.D.   On: 02/20/2021 17:20   US Abdomen Limited RUQ (LIVER/GB)  Result Date: 02/20/2021 CLINICAL DATA:  Transaminitis EXAM: ULTRASOUND ABDOMEN LIMITED RIGHT UPPER QUADRANT COMPARISON:  CT abdomen and pelvis 02/16/2021 FINDINGS: Gallbladder: Normally distended. 6 mm nonshadowing intraluminal echogenic focus likely small polyp; no follow-up imaging recommended. No  gallbladder wall thickening, pericholecystic fluid or sonographic Murphy sign. No discrete shadowing calculi. Common bile duct: Diameter: 2 mm Liver: Slightly heterogeneous echogenicity, nonspecific. No discrete hepatic mass or nodularity. No intrahepatic biliary dilatation. Portal vein is patent on color Doppler imaging with normal direction of blood flow towards the liver. Other: No RIGHT upper quadrant free fluid. IMPRESSION: 6 mm gallbladder polyp; no follow-up imaging recommended. Nonspecific mildly heterogeneous hepatic parenchymal echogenicity without mass or biliary dilatation. Electronically Signed   By: Ulyses Southward M.D.   On: 02/20/2021 18:13

## 2021-02-27 NOTE — Progress Notes (Addendum)
[facility] NEPHROLOGY PROGRESS NOTE  Assessment/ Plan: Pt is a 55 y.o. yo female with history of psoriasis, COVID-19 infection, recent diagnosis of HTN restarted HCTZ presented with generalized body pain, shortness of breath and found to have AKI.  Acute kidney injury with nephrotic syndrome, dialysis-dependent: The acute elevation of serum creatinine level to 10 from normal creatinine about a month ago. The urine study with proteinuria around 6g, serum albumin 1.5 and trace edema. No microscopic hematuria or WBC in the first urinalysis.   Repeat urinalysis was after placement of Foley catheter.  Kidney ultrasound without hydronephrosis. The lab result came back with elevated antidsDNA >300 with low C3 and C4 and high ANA titers. 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  Full house staining for immunoglobins and complements as well as tubular basement membrane deposits.  Her GN has low activity and low chronicity. Intense full house staining. Her acute tubular injury is likely related to collapsing glomerulopathy. Final biopsy report pending. -We started solumedrol 250 mg IV on 3/19 for 3 days and now on prednisone 60 mg as of 3/22, plan to taper in 2 weeks, plan to taper to 40mg  on 4/5. Further IS as below -Started dialysis on 3/19 for oliguric AKI and azotemia. RIJ temp HD catheter placed by IR. -TDC placed 3/25 with IR. Holding on consult for VVS for AVF/AVG since she is an AKI patient -HD on TTS sched > outpt AKI HD being arranged.  Next HD tomorrow.   Lupus Nephritis Class 3 -full house staining, low chronicity/low activity -Continue with prednisone 60 mg, plan to taper to 40 mg on 4/5 -started on CellCept, low-dose 250mg  bid on 3/24.  Side effect profile reviewed with patient on 3/24.  Can titrate her dose upwards about every week or so.  Now  with pancreatitis ? 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  Will need to consider d/c if no alt etiology identified for pancreatitis.   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  I did not expect this histopathological finding on her biopsy.  I do wonder if there is genetic component (APOL1 mutation) to this or if this is related to her Covid infection back in January (however this was a presumed diagnosis, was not tested). Interestingly, her father was on dialysis by the age of 56 until he passed in his 53's, cause of his ESRD was unknown and thought to be secondary to HTN, wonder if he had a similar presentation.   -Nonetheless this is an overall poor kidney prognostic sign.  She is on steroids for LN which we will continue also for this.  Final biopsy report pending  Hypertension: Blood pressure acceptable.  UF as tolerated  Exertional shortness of breath, improved: Chest x-ray negative.    Echo with EF of 60%.  UF with HD.  Transaminitis: work up per primary. improved  Hyponatremia: managing with HD, UF as tolerated  Nausea/vomiting/abd pain: imaging with pancreatitis.  Triglycerides ok.  There are reports of pancreatitis with both steroids and MMF - will need to consider carefully and if no alt etiology is identified will need to consider other therapeutic options. Addendum: d/w nephrologist who followed previously -- the abdominal pain predated the addition of cellcept so seems much less likely to be the etiology and for now will plan to continue.    Subjective: Seen and examined at bedside.CT a/p  yesterday with pancreatitis. Lipase 165.  Husband bedside.   Objective Vital signs in last 24 hours: Vitals:   02/25/21 1711 02/25/21 2008 02/26/21 0501 02/26/21 2059  BP: 106/71 119/76 (!) 149/89 (!) 147/79  Pulse: 82 95 71 70  Resp: 19 13 16 18   Temp: 97.6 F (36.4 C) 98.2 F (36.8 C) 97.9 F (36.6 C) 98.5 F (36.9 C)  TempSrc:  Oral Oral Oral Oral  SpO2: 97% 100% 99% 98%  Weight:      Height:       Weight change:   Intake/Output Summary (Last 24 hours) at 02/27/2021 1204 Last data filed at 02/27/2021 0422 Gross per 24 hour  Intake --  Output 325 ml  Net -325 ml       Labs: Basic Metabolic Panel: Recent Labs  Lab 02/21/21 1137 02/23/21 0357 02/24/21 0313 02/25/21 0359 02/26/21 0500 02/27/21 0420  NA 131*   < > 134* 135 134* 134*  K 4.8   < > 4.8 4.8 4.6 4.5  CL 97*   < > 98 99 97* 99  CO2 20*   < > 26 26 27 27   GLUCOSE 144*   < > 145* 121* 207* 154*  BUN 98*   < > 47* 61* 40* 62*  CREATININE 11.78*   < > 8.55* 9.76* 6.59* 6.78*  CALCIUM 7.9*   < > 7.7* 8.0* 7.7* 8.0*  PHOS 8.6*  --  7.0*  --   --   --    < > = values in this interval not displayed.   Liver Function Tests: Recent Labs  Lab 02/25/21 0359 02/26/21 0500 02/27/21 0420  AST 57* 50* 40  ALT 37 22 16  ALKPHOS 67 67 63  BILITOT 0.4 0.5 0.7  PROT 5.7* 5.5* 5.7*  ALBUMIN 1.9* 1.9* 2.0*   Recent Labs  Lab 02/26/21 2013 02/27/21 0420  LIPASE 178* 165*   Recent Labs  Lab 02/25/21 0359  AMMONIA 14   CBC: Recent Labs  Lab 02/21/21 1137 02/24/21 0313 02/25/21 0359 02/26/21 0500 02/27/21 0420  WBC 9.7 14.4* 9.3 11.2* 12.3*  HGB 11.0* 11.0* 10.5* 9.2* 9.1*  HCT 33.8* 33.6* 32.0* 29.2* 28.0*  MCV 89.9 90.3 91.7 93.0 92.7  PLT 143* 134* 125* 104* 125*   Cardiac Enzymes: No results for input(s): CKTOTAL, CKMB, CKMBINDEX, TROPONINI in the last 168 hours. CBG: No results for input(s): GLUCAP in the last 168 hours.  Iron Studies: No results for input(s): IRON, TIBC, TRANSFERRIN, FERRITIN in the last 72 hours. Studies/Results: CT ABDOMEN PELVIS WO CONTRAST  Result Date: 02/26/2021 CLINICAL DATA:  Abdominal pain. EXAM: CT ABDOMEN AND PELVIS WITHOUT CONTRAST TECHNIQUE: Multidetector CT imaging of the abdomen and pelvis was performed following the standard protocol without IV contrast. COMPARISON:  02/16/2021 FINDINGS:  Lower chest: No acute abnormality. Hepatobiliary: No solid liver abnormality is seen. No gallstones, gallbladder wall thickening, or biliary dilatation. Pancreas: Very extensive inflammatory fat stranding and fluid about the pancreas, adjacent retroperitoneum, and mesentery. There is no obvious focal fluid collection on noncontrast examination. Spleen: Normal in size without significant abnormality. Adrenals/Urinary Tract: Adrenal glands are unremarkable. Kidneys are normal, without renal calculi, solid lesion, or hydronephrosis. Bladder is unremarkable. Stomach/Bowel: Stomach is within normal limits. Appendix appears normal. No evidence of bowel wall thickening, distention, or inflammatory changes. Vascular/Lymphatic: No significant vascular findings are present. No enlarged abdominal or pelvic lymph nodes. Reproductive: No mass or other significant abnormality. Other: No abdominal wall hernia or abnormality. No abdominopelvic ascites. Musculoskeletal:  No acute or significant osseous findings. IMPRESSION: Very extensive inflammatory fat stranding and fluid about the pancreas, adjacent retroperitoneum, and mesentery, consistent with acute pancreatitis. No obvious focal fluid collection or evidence of necrosis on noncontrast examination. Electronically Signed   By: Lauralyn Primes M.D.   On: 02/26/2021 15:42    Medications: Infusions:   Scheduled Medications: . Chlorhexidine Gluconate Cloth  6 each Topical Q0600  . famotidine  10 mg Oral Daily  . feeding supplement (NEPRO CARB STEADY)  237 mL Oral TID BM  . heparin injection (subcutaneous)  5,000 Units Subcutaneous Q8H  . kidney failure book   Does not apply Once  . mycophenolate  250 mg Oral BID  . polyethylene glycol  17 g Oral Daily  . predniSONE  60 mg Oral Q breakfast   I have reviewed scheduled and prn medications.  Physical Exam: General:NAD ENT: no thrush, torus palitini Heart:RRR, s1s2 nl Lungs:  no iwob Abdomen:soft, Non-tender,  non-distended Extremities: Trace pedal/ankle edema b/l Dialysis Access: Right tunneled HD catheter c/d/i  Tyler Pita 02/27/2021,12:04 PM  LOS: 11 days

## 2021-02-27 NOTE — Progress Notes (Signed)
Pt refused heparin injection and MRI today. Will do MRI tomorrow in the morning. Dr. Randol Kern notified.

## 2021-02-27 NOTE — Progress Notes (Signed)
Renal Navigator met with patient at bedside to, again, discuss outpatient HD referral for AKI treatment. Patient was sitting up in bedside chair and appeared ready to talk with Navigator. She still seems down and we talked about how this was not the outcome she expected when she came in to the hospital, and how starting dialysis will take time to process. We hope for renal recovery. She is familiar with dialysis because her father was a dialysis patient for approximately 15 years. She states she is not interested in home HD, because "it's not [place] Reha to be a dialysis patient at home." Though she acknowledges that she is sure things have come a long way since her father was on HD. She may be open to education about different modalities, but we talked about the fact that there are pros and cons to each and that not all are right for every patient.  Navigator asked about patient's thoughts on returning to work, as on Thursday of last week, patient stated that she hoped to be able to return to work. Today, patient does not think that she will be able to go back to work. She just wants to be referred to the closest clinic to her home and states that it is important to her to remain with [employer]. Navigator explained that this means a referral to [facility] on [address].  Patient drove herself prior to hospitalization and Navigator noted that lots of patients drive themselves to and from outpatient HD clinic treatments, however, Navigator advises that she have a friend or family member take her to her first few to ensure she has a chance to see how she feels after treatments once she is out of the hospital. She agrees and states she will be able to find someone to do this. Patient's husband is involved and supportive. He drives a truck (locally) for [employer].  In regards to insurance, patient currently has a private plan through her employer. She plans to get on her husbands plan  now that she does not plan to return to work. Navigator states that the HD clinic has insurance coordinators to speak to if she has questions and mentioned to patient that AKI dx does not qualify her for Medicare, but if she is diagnosed with ESRD at any point in time, she will qualify for Medicare. Patient stated understanding.  Referral submitted to [facility] to request seat at the [facility] clinic. Navigator will follow closely.   Alphonzo Cruise, [facility] Renal Navigator [phone]

## 2021-02-27 NOTE — Progress Notes (Signed)
Occupational Therapy Treatment Patient Details Name: Denise Watson MRN: [id number] DOB: March 03, 1966 Today's Date: 02/27/2021    History of present illness Pt is a 55 y.o. female admitted 02/16/21 with fatigue, SOB. 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 S/p RIJ temp HD cath placement 3/19. Plan for renal biopsy 3/21. PMH includes HTN, obesity, psoriasis, COVID-19 (12/2020).   OT comments  OT treatment session with focus on self-care re-education, OOB activity tolerance, household functional mobility with and without AD, and confidence building in prep for safe d/c home. Patient progressing toward OT goals demonstrating sit to stand transfers and short-distance functional mobility with use of RW and supervision A. Patient also demonstrates increased participation with therapy efforts this date. Patient would benefit from continued acute OT services in prep for safe d/c home with continued recommendation for HHOT.    Follow Up Recommendations  Home health OT    Equipment Recommendations  3 in 1 bedside commode    Recommendations for Other Services      Precautions / Restrictions Precautions Precautions: Fall Precaution Comments: Quick to fatigue (improving) Restrictions Weight Bearing Restrictions: No       Mobility Bed Mobility Overal bed mobility: Modified Independent             General bed mobility comments: Supine to EOB without external assist. Completes task in reasonable amount of time.    Transfers Overall transfer level: Needs assistance Equipment used: Rolling walker (2 wheeled) Transfers: Sit to/from Stand Sit to Stand: Supervision         General transfer comment: Supervision A for sit to stand from EOB positioned in lowest setting.    Balance     Sitting balance-Leahy Scale: Good       Standing balance-Leahy Scale: Fair Standing balance comment: Reliant on at least unilateral UE support with HHA+1.                            ADL either performed or assessed with clinical judgement   ADL                                         General ADL Comments: Patient much more participatory this date. Good participation with therapy efforts.     Vision       Perception     Praxis      Cognition Arousal/Alertness: Awake/alert Behavior During Therapy: WFL for tasks assessed/performed Overall Cognitive Status: Within Functional Limits for tasks assessed                                          Exercises     Shoulder Instructions       General Comments VSS on RA.    Pertinent Vitals/ Pain       Pain Assessment: No/denies pain Pain Intervention(s): Monitored during session  Home Living                                          Prior Functioning/Environment              Frequency  Min 2X/week        Progress  Toward Goals  OT Goals(current goals can now be found in the care plan section)  Progress towards OT goals: Progressing toward goals  Acute Rehab OT Goals Patient Stated Goal: To return home. OT Goal Formulation: With patient Time For Goal Achievement: 03/05/21 Potential to Achieve Goals: Good ADL Goals Pt Will Perform Grooming: with supervision;standing Pt Will Perform Lower Body Bathing: with supervision;sit to/from stand;with adaptive equipment Pt Will Perform Lower Body Dressing: with supervision;with adaptive equipment;sit to/from stand Pt Will Transfer to Toilet: with supervision;ambulating;bedside commode Pt Will Perform Toileting - Clothing Manipulation and hygiene: with supervision;sit to/from stand Additional ADL Goal #1: Pt will state at least 3 energy conservation strategies as instructed.  Plan Discharge plan remains appropriate;Frequency remains appropriate    Co-evaluation                 AM-PAC OT "6 Clicks" Daily Activity     Outcome Measure   Help from another person eating meals?:  None Help from another person taking care of personal grooming?: A Little Help from another person toileting, which includes using toliet, bedpan, or urinal?: A Little Help from another person bathing (including washing, rinsing, drying)?: A Little Help from another person to put on and taking off regular upper body clothing?: A Little Help from another person to put on and taking off regular lower body clothing?: A Little 6 Click Score: 19    End of Session Equipment Utilized During Treatment: Gait belt;Rolling walker  OT Visit Diagnosis: Unsteadiness on feet (R26.81);Other abnormalities of gait and mobility (R26.89);Muscle weakness (generalized) (M62.81);Other (comment)   Activity Tolerance Patient tolerated treatment well   Patient Left in chair;with call bell/phone within reach;with family/visitor present   Nurse Communication          Time: 9485-4627 OT Time Calculation (min): 12 min  Charges: OT General Charges $OT Visit: 1 Visit OT Treatments $Therapeutic Activity: 8-22 mins  Nikolos Billig H. OTR/L Supplemental OT, Department of rehab services [phone]   Leocadio Heal R H. 02/27/2021, 2:32 PM

## 2021-02-28 ENCOUNTER — Inpatient Hospital Stay (HOSPITAL_COMMUNITY): Payer: BC Managed Care – PPO

## 2021-02-28 DIAGNOSIS — K858 Other acute pancreatitis without necrosis or infection: Secondary | ICD-10-CM

## 2021-02-28 LAB — COMPREHENSIVE METABOLIC PANEL
ALT: 21 U/L (ref 0–44)
AST: 41 U/L (ref 15–41)
Albumin: 2 g/dL — ABNORMAL LOW (ref 3.5–5.0)
Alkaline Phosphatase: 67 U/L (ref 38–126)
Anion gap: 8 (ref 5–15)
BUN: 76 mg/dL — ABNORMAL HIGH (ref 6–20)
CO2: 28 mmol/L (ref 22–32)
Calcium: 8.1 mg/dL — ABNORMAL LOW (ref 8.9–10.3)
Chloride: 99 mmol/L (ref 98–111)
Creatinine, Ser: 5.2 mg/dL — ABNORMAL HIGH (ref 0.44–1.00)
GFR, Estimated: 9 mL/min — ABNORMAL LOW (ref >60)
Glucose, Bld: 181 mg/dL — ABNORMAL HIGH (ref 70–99)
Potassium: 5.2 mmol/L — ABNORMAL HIGH (ref 3.5–5.1)
Sodium: 135 mmol/L (ref 135–145)
Total Bilirubin: 0.4 mg/dL (ref 0.3–1.2)
Total Protein: 5.5 g/dL — ABNORMAL LOW (ref 6.5–8.1)

## 2021-02-28 LAB — CBC
HCT: 27.6 % — ABNORMAL LOW (ref 36.0–46.0)
Hemoglobin: 8.7 g/dL — ABNORMAL LOW (ref 12.0–15.0)
MCH: 29.5 pg (ref 26.0–34.0)
MCHC: 31.5 g/dL (ref 30.0–36.0)
MCV: 93.6 fL (ref 80.0–100.0)
Platelets: 130 10*3/uL — ABNORMAL LOW (ref 150–400)
RBC: 2.95 MIL/uL — ABNORMAL LOW (ref 3.87–5.11)
RDW: 13.8 % (ref 11.5–15.5)
WBC: 11.2 10*3/uL — ABNORMAL HIGH (ref 4.0–10.5)
nRBC: 0 % (ref 0.0–0.2)

## 2021-02-28 LAB — IGG 4: IgG, Subclass 4: 70 mg/dL (ref 2–96)

## 2021-02-28 MED ORDER — POLYETHYLENE GLYCOL 3350 17 G PO PACK
34.0000 g | PACK | Freq: Once | ORAL | Status: AC
  Administered 2021-02-28: 34 g via ORAL
  Filled 2021-02-28: qty 2

## 2021-02-28 MED ORDER — BISACODYL 5 MG PO TBEC
10.0000 mg | DELAYED_RELEASE_TABLET | Freq: Once | ORAL | Status: DC
  Filled 2021-02-28: qty 2

## 2021-02-28 NOTE — Progress Notes (Signed)
Round on patient in HD today to follow up on information given and current treatments. Patient was found in the bed tolerating her treatment well. She reports feeling better. Noted decreased swelling to bilateral lower extremities as well. Patient reports that she was a "little overwhelmed" the first day we met, but reports that her sister will be here tomorrow in the morning. Plan to round on patient and sister in the morning to answer any follow up questions the patient may have.  Dorthey Sawyer, RN  Dialysis Nurse Coordinator [phone]

## 2021-02-28 NOTE — Progress Notes (Signed)
Physical Therapy Treatment Patient Details Name: Denise Watson MRN: [id number] DOB: Nov 18, 1966 Today's Date: 02/28/2021    History of Present Illness Pt is a 55 y.o. female admitted 02/16/21 with fatigue, SOB. 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 S/p RIJ temp HD cath placement 3/19. Plan for renal biopsy 3/21. PMH includes HTN, obesity, psoriasis, COVID-19 (12/2020).   PT Comments    Pt progressing well with mobility, much improved affect this session and motivated to participate. Today's session focused on gait training with and without RW. Pt demonstrates improving strength, activity tolerance and balance, although still with functional mobility deficits. Pt hopeful for d/c home soon; reviewed activity recommendations, DME needs. Encouraged more frequent hallway ambulation with supervision from nursing or family. Will continue to follow acutely.    Follow Up Recommendations  Home health PT;Supervision for mobility/OOB     Equipment Recommendations  Rolling walker with 5" wheels; Shower seat with rails (not 3in1 - will not fit in tub)    Recommendations for Other Services       Precautions / Restrictions Precautions Precautions: Fall Restrictions Weight Bearing Restrictions: No    Mobility  Bed Mobility Overal bed mobility: Modified Independent                  Transfers Overall transfer level: Modified independent Equipment used: Rolling walker (2 wheeled) Transfers: Sit to/from Stand              Ambulation/Gait Ambulation/Gait assistance: Supervision;Min guard Gait Distance (Feet): 300 Feet Assistive device: Rolling walker (2 wheeled);None Gait Pattern/deviations: Step-to pattern;Trunk flexed;Decreased stride length Gait velocity: Decreased   General Gait Details: Slow, steady gait with RW and supervision for safety; cues to increase step length and gait speed. Additional gait trial without DME, intermittent min guard for balance;  stability improved with increase in step length and gait speed; pt's gait mechanics improved when focused on a different task (i.e. finding correct room)   Stairs Stairs:  (Pt declined secondary to fatigue with gait training; discussed safe strategy)           Wheelchair Mobility    Modified Rankin (Stroke Patients Only)       Balance Overall balance assessment: Needs assistance Sitting-balance support: Feet supported;No upper extremity supported Sitting balance-Leahy Scale: Good     Standing balance support: Bilateral upper extremity supported;No upper extremity supported;During functional activity Standing balance-Leahy Scale: Fair                              Cognition Arousal/Alertness: Awake/alert Behavior During Therapy: WFL for tasks assessed/performed Overall Cognitive Status: Within Functional Limits for tasks assessed                                 General Comments: Much improved affect this session. Sister states, "I haven't seen you smile like that in days"      Exercises      General Comments General comments (skin integrity, edema, etc.): Sister present and supportive. Pt feels more confident using RW, although she does not necessarily require it for stability; educ on short-term use at home so pt does not become dependent. Educ on activity strategies; encouraged more frequent hallway ambulation with supervision from staff or family (RN aware)      Pertinent Vitals/Pain Pain Assessment: No/denies pain Pain Intervention(s): Monitored during session    Home Living  Prior Function            PT Goals (current goals can now be found in the care plan section) Progress towards PT goals: Progressing toward goals    Frequency    Min 3X/week      PT Plan Current plan remains appropriate    Co-evaluation              AM-PAC PT "6 Clicks" Mobility   Outcome Measure  Help needed  turning from your back to your side while in a flat bed without using bedrails?: None Help needed moving from lying on your back to sitting on the side of a flat bed without using bedrails?: None Help needed moving to and from a bed to a chair (including a wheelchair)?: A Little Help needed standing up from a chair using your arms (e.g., wheelchair or bedside chair)?: None Help needed to walk in hospital room?: A Little Help needed climbing 3-5 steps with a railing? : A Little 6 Click Score: 21    End of Session Equipment Utilized During Treatment: Gait belt Activity Tolerance: Patient tolerated treatment well Patient left: in chair;with call bell/phone within reach;with family/visitor present Nurse Communication: Mobility status PT Visit Diagnosis: Other abnormalities of gait and mobility (R26.89);Difficulty in walking, not elsewhere classified (R26.2)     Time: 8182-9937 PT Time Calculation (min) (ACUTE ONLY): 20 min  Charges:  $Gait Training: 8-22 mins                     Ina Homes, PT, DPT Acute Rehabilitation Services  Pager [phone] Office [phone]  Malachy Chamber 02/28/2021, 1:35 PM

## 2021-02-28 NOTE — Progress Notes (Signed)
PROGRESS NOTE                                                                                                                                                                                                             Patient Demographics:    Denise Watson, is a 55 y.o. female, DOB - 1966/11/09, HQP:[id number]  Outpatient Primary MD for the patient is Gillian Scarce    LOS - 12  Admit date - 02/16/2021    Chief Complaint  Patient presents with  . Shortness of Breath       Brief Narrative Denise Watson  is a 55 y.o. female, who is in relatively good health except recent diagnosis of essential hypertension requiring HCTZ for the last few weeks, obesity, COVID-19 infection in January, psoriasis, C-section in the past, intermittent palpitations in the past.  Apparently patient has been having symptoms of sore throat for the last 2 weeks and subsequently has been developing gradual fatigue and some exertional shortness of breath, denies any fever chills, no chest or abdominal pain, no blood in stool or urine, no cough or sinus pressure, no swelling in arms or legs, she does have petechial rash but she says this is due to her psoriasis and is chronic.  She presented to the ER where she was found to have AKI with a creatinine of 10, nephrology was consulted and I was requested to admit the patient.  Currently review of systems positive for sore throat, fatigue, exertional shortness of breath, chronic petechial rash due to psoriasis according to the patient.     Subjective:   Patient today reports liminal pain almost resolved, appetite has improved as well, no dyspnea, no fever, no chills.      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 -TDC right IJ placed by IR 3/25. -started on cellcept -He is with some altered mental status, CT head with no acute findings, has improved after stopping tramadol  Acute pancreatitis -Patient complaining of abdominal pain, CT abdomen pelvis 3/27 significant for acute pancreatitis (not present on admission CT) -Lipase level trending down -Triglycerides within normal limit -No evidence of cholelithiasis on imaging on admission  ultrasound or CT, MRCP with no evidence of gallstones.   - IgG4 pending -Pain has improved, continue with renal diet -CellCept may contribute to acute pancreatitis, but her abdominal pain has preceded starting CellCept .  Petechial rash.  - Patient claims this is chronic due to psoriasis but could be due to platelet dysfunction caused by AKI.  Will monitor.  Platelet counts are stable pressure stable.  Borderline hyperkalemia.  - Stable after kayaxalate and HD.  Exertional shortness of breath -  likely due to AKI, stable TTE.  Significantly improved after starting hemodialysis  Obesity.  BMI 39.  Follow with PCP.  Hypertension.  Gentle IV hydralazine, will avoid hypotension due to AKI.  Pharyngitis.  Strep throat culture negative, strep urinary antigen negative, ASO titer negative, likely viral pharyngitis  History of psoriasis.  Question if she has discoid lupus.  Outpatient dermatology follow-up.  Asymptomatic mild rise in LFTs.  Would be due to hepatic congestion along with acute hepatitis panel negative, she is symptom-free.Stable RUQ [place] with incidental gallbladder polyp, outpatient follow-up with PCP.  LFTs trending down.       Condition -   Guarded  Family Communication  : Discussed with sister  at bedside .  Code Status :  Full  Consults  :  Renal  PUD Prophylaxis :    Procedures  :     RUQ US - GB Polyp  R. Kidney biopsy 02/20/21  R.IJ HD Cath by IR 02/18/21  Renal US - medical renal disease.  TTE - 1.  Left ventricular ejection fraction, by estimation, is 60%. The left ventricle has normal function. The left ventricle has no regional wall motion abnormalities. There is mild left ventricular hypertrophy. Left ventricular diastolic parameters were low normal.  2. Right ventricular systolic function is normal. The right ventricular size is normal. Tricuspid regurgitation signal is inadequate for assessing PA pressure.  3. The mitral valve is normal in structure. Trivial mitral valve regurgitation. No evidence of mitral stenosis.  4. The aortic valve is tricuspid. Aortic valve regurgitation is not visualized. No aortic stenosis is present.  5. The inferior vena cava is normal in size with greater than 50% respiratory variability, suggesting right atrial pressure of 3 mmHg.       Disposition Plan  :    Status is: Inpatient  Remains inpatient appropriate because:IV treatments appropriate due to intensity of illness or inability to take PO   Dispo: The patient is from: Home              Anticipated d/c is to: Home              Patient currently is not medically stable to d/c.   Difficult to place patient No  DVT Prophylaxis  :  Heparin   Lab Results  Component Value Date   PLT 130 (L) 02/28/2021    Diet :  Diet Order            Diet renal with fluid restriction Fluid restriction: 1200 mL Fluid; Room service appropriate? Yes; Fluid consistency: Thin  Diet effective now                  Inpatient Medications  Scheduled Meds: . Chlorhexidine Gluconate Cloth  6 each Topical Q0600  . famotidine  10 mg Oral Daily  . feeding supplement (NEPRO CARB STEADY)  237 mL Oral TID BM  . heparin injection (subcutaneous)  5,000 Units Subcutaneous Q8H  . kidney failure book  Does not apply Once  . mycophenolate  250 mg Oral BID  . polyethylene glycol  17 g Oral BID  . predniSONE  60 mg Oral Q breakfast   Continuous Infusions:  PRN Meds:.acetaminophen **OR** [DISCONTINUED] acetaminophen, alum  & mag hydroxide-simeth, bisacodyl, docusate sodium, hydrALAZINE, [DISCONTINUED] ondansetron **OR** ondansetron (ZOFRAN) IV, ondansetron, phenol, sodium chloride flush, traMADol, traZODone  Antibiotics  :    Anti-infectives (From admission, onward)   Start     Dose/Rate Route Frequency Ordered Stop   02/24/21 1400  ceFAZolin (ANCEF) 3 g in dextrose 5 % 50 mL IVPB  Status:  Discontinued        3 g 100 mL/hr over 30 Minutes Intravenous To Radiology 02/23/21 1350 02/24/21 1036   02/24/21 1045  ceFAZolin (ANCEF) 3 g in dextrose 5 % 50 mL IVPB        3 g 100 mL/hr over 30 Minutes Intravenous To Radiology 02/24/21 1036 02/24/21 1040       Lani Mendiola M.D on 02/28/2021 at 5:04 PM  To page go to [email or web address]   [facility] -  Office  [phone]   See all Orders from today for further details    Objective:   Vitals:   02/28/21 1530 02/28/21 1600 02/28/21 1630 02/28/21 1649  BP: (P) 135/79 137/81 140/85 (!) 156/82  Pulse:  70 69 69  Resp:    17  Temp:    97.8 F (36.6 C)  TempSrc:    Oral  SpO2:    98%  Weight:    132.4 kg  Height:        Wt Readings from Last 3 Encounters:  02/28/21 132.4 kg  01/27/21 108.9 kg  06/27/16 132 kg     Intake/Output Summary (Last 24 hours) at 02/28/2021 1704 Last data filed at 02/28/2021 1649 Gross per 24 hour  Intake 240 ml  Output 0 ml  Net 240 ml     Physical Exam  Awake Alert, Oriented X 3, No new F.N deficits, Normal affect Symmetrical Chest wall movement, Good air movement bilaterally, CTAB RRR,No Gallops,Rubs or new Murmurs, No Parasternal Heave +ve B.Sounds, Abd Soft, Tenderness significantly subsided, No rebound - guarding or rigidity. No Cyanosis, Clubbing or edema, No new Rash or bruise         Data Review:    CBC Recent Labs  Lab 02/24/21 0313 02/25/21 0359 02/26/21 0500 02/27/21 0420 02/28/21 0553  WBC 14.4* 9.3 11.2* 12.3* 11.2*  HGB 11.0* 10.5* 9.2* 9.1* 8.7*  HCT 33.6* 32.0* 29.2* 28.0*  27.6*  PLT 134* 125* 104* 125* 130*  MCV 90.3 91.7 93.0 92.7 93.6  MCH 29.6 30.1 29.3 30.1 29.5  MCHC 32.7 32.8 31.5 32.5 31.5  RDW 13.8 14.0 13.9 13.9 13.8    Recent Labs  Lab 02/24/21 0313 02/25/21 0359 02/26/21 0500 02/27/21 0420 02/28/21 0553  NA 134* 135 134* 134* 135  K 4.8 4.8 4.6 4.5 5.2*  CL 98 99 97* 99 99  CO2 26 26 27 27 28   GLUCOSE 145* 121* 207* 154* 181*  BUN 47* 61* 40* 62* 76*  CREATININE 8.55* 9.76* 6.59* 6.78* 5.20*  CALCIUM 7.7* 8.0* 7.7* 8.0* 8.1*  AST 79* 57* 50* 40 41  ALT 76* 37 22 16 21   ALKPHOS 74 67 67 63 67  BILITOT 0.7 0.4 0.5 0.7 0.4  ALBUMIN 2.0*  1.9* 1.9* 1.9* 2.0* 2.0*  AMMONIA  --  14  --   --   --     ------------------------------------------------------------------------------------------------------------------  Recent Labs    02/27/21 0420  CHOL 270*  HDL 42  LDLCALC 187*  TRIG 203*  CHOLHDL 6.4    Lab Results  Component Value Date   HGBA1C 6.3 (H) 02/16/2021   ------------------------------------------------------------------------------------------------------------------ No results for input(s): TSH, T4TOTAL, T3FREE, THYROIDAB in the last 72 hours.  Invalid input(s): FREET3  Cardiac Enzymes No results for input(s): CKMB, TROPONINI, MYOGLOBIN in the last 168 hours.  Invalid input(s): CK ------------------------------------------------------------------------------------------------------------------    Component Value Date/Time   BNP 34.5 02/20/2021 0310    Micro Results No results found for this or any previous visit (from the past 240 hour(s)).  Radiology Reports CT ABDOMEN PELVIS WO CONTRAST  Result Date: 02/26/2021 CLINICAL DATA:  Abdominal pain. EXAM: CT ABDOMEN AND PELVIS WITHOUT CONTRAST TECHNIQUE: Multidetector CT imaging of the abdomen and pelvis was performed following the standard protocol without IV contrast. COMPARISON:  02/16/2021 FINDINGS: Lower chest: No acute abnormality. Hepatobiliary: No  solid liver abnormality is seen. No gallstones, gallbladder wall thickening, or biliary dilatation. Pancreas: Very extensive inflammatory fat stranding and fluid about the pancreas, adjacent retroperitoneum, and mesentery. There is no obvious focal fluid collection on noncontrast examination. Spleen: Normal in size without significant abnormality. Adrenals/Urinary Tract: Adrenal glands are unremarkable. Kidneys are normal, without renal calculi, solid lesion, or hydronephrosis. Bladder is unremarkable. Stomach/Bowel: Stomach is within normal limits. Appendix appears normal. No evidence of bowel wall thickening, distention, or inflammatory changes. Vascular/Lymphatic: No significant vascular findings are present. No enlarged abdominal or pelvic lymph nodes. Reproductive: No mass or other significant abnormality. Other: No abdominal wall hernia or abnormality. No abdominopelvic ascites. Musculoskeletal: No acute or significant osseous findings. IMPRESSION: Very extensive inflammatory fat stranding and fluid about the pancreas, adjacent retroperitoneum, and mesentery, consistent with acute pancreatitis. No obvious focal fluid collection or evidence of necrosis on noncontrast examination. Electronically Signed   By: Lauralyn Primes M.D.   On: 02/26/2021 15:42   CT ABDOMEN PELVIS WO CONTRAST  Result Date: 02/16/2021 CLINICAL DATA:  Abdominal pain. EXAM: CT ABDOMEN AND PELVIS WITHOUT CONTRAST TECHNIQUE: Multidetector CT imaging of the abdomen and pelvis was performed following the standard protocol without IV contrast. COMPARISON:  None. FINDINGS: Lower chest: No acute abnormality. Hepatobiliary: No focal liver abnormality is seen. No gallstones, gallbladder wall thickening, or biliary dilatation. Pancreas: Unremarkable. No pancreatic ductal dilatation or surrounding inflammatory changes. Spleen: Normal in size without focal abnormality. Adrenals/Urinary Tract: Adrenal glands appear normal. No hydronephrosis or renal  obstruction is noted. No renal or ureteral calculi are noted. Urinary bladder is decompressed secondary to Foley catheter. Minimal inflammatory changes are noted around the right kidney suggesting possible pyelonephritis. Stomach/Bowel: Stomach is within normal limits. Appendix appears normal. No evidence of bowel wall thickening, distention, or inflammatory changes. Vascular/Lymphatic: No significant vascular findings are present. No enlarged abdominal or pelvic lymph nodes. Reproductive: No adnexal abnormality is noted. Probable small exophytic uterine fibroid is noted arising posteriorly from the uterus. Other: No abdominal wall hernia or abnormality. No abdominopelvic ascites. Musculoskeletal: No acute or significant osseous findings. IMPRESSION: 1. Minimal inflammatory changes are noted around the right kidney suggesting possible pyelonephritis. 2. No hydronephrosis or renal obstruction is noted. No renal or ureteral calculi are noted. 3. Probable small exophytic uterine fibroid is noted arising posteriorly from the uterus. Electronically Signed   By: Lupita Raider M.D.   On: 02/16/2021 18:32   DG Chest 2 View  Result Date: 02/16/2021 CLINICAL DATA:  Shortness of breath. EXAM: CHEST - 2 VIEW COMPARISON:  January 27, 2021. FINDINGS: The heart size and mediastinal contours are within normal limits. Both lungs are clear. No pneumothorax or pleural effusion is noted. The visualized skeletal structures are unremarkable. IMPRESSION: No active cardiopulmonary disease. Electronically Signed   By: Lupita Raider M.D.   On: 02/16/2021 12:53   CT HEAD WO CONTRAST  Result Date: 02/24/2021 CLINICAL DATA:  Delirium EXAM: CT HEAD WITHOUT CONTRAST TECHNIQUE: Contiguous axial images were obtained from the base of the skull through the vertex without intravenous contrast. COMPARISON:  01/27/2021 FINDINGS: Brain: There is no acute intracranial hemorrhage, mass effect, or edema. Gray-white differentiation is preserved.  There is no extra-axial fluid collection. Ventricles and sulci are within normal limits in size and configuration. Vascular: No hyperdense vessel or unexpected calcification. Skull: Calvarium is unremarkable. Sinuses/Orbits: No acute finding. Other: Partially empty sella. IMPRESSION: No acute intracranial abnormality. Electronically Signed   By: Guadlupe Spanish M.D.   On: 02/24/2021 15:59   US RENAL  Result Date: 02/21/2021 CLINICAL DATA:  Acute renal insufficiency EXAM: RENAL / URINARY TRACT ULTRASOUND COMPLETE COMPARISON:  02/20/2021 right upper quadrant ultrasound. Renal ultrasound 02/16/2021. FINDINGS: Right Kidney: Renal measurements: 12.3 by 6.0 x 5.5 cm = volume: 213 mL. Increased renal echogenicity. No hydronephrosis. Left Kidney: Renal measurements: 10.0 x 5.3 x 4.4 cm = volume: 128 mL. Increased renal echogenicity. No hydronephrosis. Bladder: Collapsed around a Foley catheter. Other: None. IMPRESSION: No hydronephrosis. Increased renal echogenicity, suggesting medical renal disease. Electronically Signed   By: Jeronimo Greaves M.D.   On: 02/21/2021 21:36   US Renal  Result Date: 02/16/2021 CLINICAL DATA:  Renal failure EXAM: RENAL / URINARY TRACT ULTRASOUND COMPLETE COMPARISON:  CT 02/16/2021 FINDINGS: Right Kidney: Renal measurements: 11.9 x 5.8 x 4.4 cm = volume: 159.2 mL. Cortex is echogenic. No mass or hydronephrosis. Trace perinephric fluid Left Kidney: Renal measurements: 9.7 x 5.2 x 4.9 cm = volume: 129.5 mL. Cortex is echogenic. No mass or hydronephrosis. Bladder: Empty by Foley catheter. Other: None. IMPRESSION: Echogenic kidneys bilaterally consistent with medical renal disease. No hydronephrosis Electronically Signed   By: Jasmine Pang M.D.   On: 02/16/2021 20:23   MR ABDOMEN MRCP WO CONTRAST  Result Date: 02/28/2021 CLINICAL DATA:  Pancreatitis EXAM: MRI ABDOMEN WITHOUT CONTRAST  (INCLUDING MRCP) TECHNIQUE: Multiplanar multisequence MR imaging of the abdomen was performed. Heavily  T2-weighted images of the biliary and pancreatic ducts were obtained, and three-dimensional MRCP images were rendered by post processing. COMPARISON:  CT abdomen 02/26/2021 FINDINGS: Lower chest: Linear subsegmental atelectasis or scarring posteriorly in the right lower lobe. Hepatobiliary: No biliary dilatation. No filling defect in the common bile duct to suggest choledocholithiasis. Gallbladder unremarkable. No significant focal parenchymal lesion is identified on today's noncontrast examination. Pancreas: Prominent peripancreatic edema compatible with acute pancreatitis with acute fluid collections. Surrounding mesenteric edema tracking along tissue planes, including the retroperitoneum and right perirenal space as well as the right psoas muscle. No obvious walled-off collection to indicate pseudocyst or abscess, sensitivity and specificity mildly adversely affected by the lack of IV contrast. Assessment for pancreatic necrosis indeterminate due to lack of IV contrast. Upper normal caliber of the dorsal pancreatic duct without overt dilatation. Spleen:  Unremarkable Adrenals/Urinary Tract:  Unremarkable Stomach/Bowel: Mild secondary inflammation of the second and third portions of the duodenum related to the pancreatitis. No dilated bowel. Vascular/Lymphatic:  No pathologic adenopathy. Other:  No supplemental non-categorized findings. Musculoskeletal: Low-grade bilateral flank edema. IMPRESSION: 1. Acute pancreatitis with acute peripancreatic fluid collections. No finding suggestive of pseudocyst  or abscess. Without IV contrast, assessment for pancreatic necrosis is problematic. Expected edema tracking in the peripancreatic region and also extending along the right retroperitoneum. 2. No biliary dilatation or findings of choledocholithiasis. Electronically Signed   By: Gaylyn Rong M.D.   On: 02/28/2021 14:17   IR Fluoro Guide CV Line Right  Result Date: 02/24/2021 CLINICAL DATA:  Renal failure and  need for hemodialysis. The patient currently has a non tunneled temporary dialysis catheter via the right internal jugular vein and requires a tunneled dialysis catheter for longer-term hemodialysis. EXAM: TUNNELED CENTRAL VENOUS HEMODIALYSIS CATHETER PLACEMENT WITH ULTRASOUND AND FLUOROSCOPIC GUIDANCE ANESTHESIA/SEDATION: 1.0 mg IV Versed; 50 mcg IV Fentanyl. Total Moderate Sedation Time:   25 minutes. The patient's level of consciousness and physiologic status were continuously monitored during the procedure by Radiology nursing. MEDICATIONS: 3 g IV Ancef. FLUOROSCOPY TIME:  24 seconds.  2.5 mGy. PROCEDURE: The procedure, risks, benefits, and alternatives were explained to the patient. Questions regarding the procedure were encouraged and answered. The patient understands and consents to the procedure. A timeout was performed prior to initiating the procedure. The pre-existing non tunneled right jugular temporary dialysis catheter was removed and hemostasis obtained with manual compression over the exit site. Ultrasound was used to confirm patency of the right internal jugular vein after non tunneled catheter removal. The right neck and chest were prepped with chlorhexidine in a sterile fashion, and a sterile drape was applied covering the operative field. Maximum barrier sterile technique with sterile gowns and gloves were used for the procedure. Local anesthesia was provided with 1% lidocaine. After creating a small venotomy incision, a 21 gauge needle was advanced into the right internal jugular vein under direct, real-time ultrasound guidance. Ultrasound image documentation was performed. After securing guidewire access, an 8 Fr dilator was placed. A J-wire was kinked to measure appropriate catheter length. A Palindrome tunneled hemodialysis catheter measuring 19 cm from tip to cuff was chosen for placement. This was tunneled in a retrograde fashion from the chest wall to the venotomy incision. At the  venotomy, serial dilatation was performed and a 15 Fr peel-away sheath was placed over a guidewire. The catheter was then placed through the sheath and the sheath removed. Final catheter positioning was confirmed and documented with a fluoroscopic spot image. The catheter was aspirated, flushed with saline, and injected with appropriate volume heparin dwells. The venotomy incision was closed with subcuticular 4-0 Vicryl. Dermabond was applied to the incision. The catheter exit site was secured with 0-Prolene retention sutures. COMPLICATIONS: None.  No pneumothorax. FINDINGS: After catheter placement, the tip lies in the right atrium. The catheter aspirates normally and is ready for immediate use. IMPRESSION: Placement of tunneled hemodialysis catheter via the right internal jugular vein. The catheter tip lies in the right atrium. The catheter is ready for immediate use. Electronically Signed   By: Irish Lack M.D.   On: 02/24/2021 12:01   IR Fluoro Guide CV Line Right  Result Date: 02/18/2021 INDICATION: 55 year old female referred for temporary hemodialysis catheter placement EXAM: IMAGE GUIDED PLACEMENT OF TEMPORARY HEMODIALYSIS CATHETER MEDICATIONS: None ANESTHESIA/SEDATION: Moderate (conscious) sedation was employed during this procedure. A total of Versed 1.0 mg and Fentanyl 50 mcg was administered intravenously. Moderate Sedation Time: 13 minutes. The patient's level of consciousness and vital signs were monitored continuously by radiology nursing throughout the procedure under my direct supervision. FLUOROSCOPY TIME:  Fluoroscopy Time: 0 minutes 6 seconds (1 mGy). COMPLICATIONS: None PROCEDURE: Informed written consent was obtained from the patient  and the patient's family after a thorough discussion of the procedural risks, benefits and alternatives. All questions were addressed. A timeout was performed prior to the initiation of the procedure. The right neck and chest was prepped with chlorhexidine,  and draped in the usual sterile fashion using maximum barrier technique (cap and mask, sterile gown, sterile gloves, large sterile sheet, hand hygiene and cutaneous antiseptic). Local anesthesia was attained by infiltration with 1% lidocaine without epinephrine. Ultrasound demonstrated patency of the right internal jugular vein, and this was documented with an image. Under real-time ultrasound guidance, this vein was accessed with a 21 gauge micropuncture needle and image documentation was performed. A small dermatotomy was made at the access site with an 11 scalpel. A 0.018" wire was advanced into the SVC and the access needle exchanged for a 40F micropuncture vascular sheath. The 0.018" wire was then removed and a 0.035" wire advanced into the IVC. Upon withdrawal of the 018 wire, the wire was marked for appropriate length of the internal portion of the catheter. A 16 cm catheter was selected. Skin and subcutaneous tissues were serially dilated. Catheter was placed on the wire. The catheter tip is positioned in the upper right atrium. This was documented with a spot image. Both ports of the hemodialysis catheter were then tested for excellent function. The ports were then locked with heparinized lock. Patient tolerated the procedure well and remained hemodynamically stable throughout. No complications were encountered and no significant blood loss was encountered. IMPRESSION: Status post image guided placement of temporary hemodialysis catheter. Catheter may be converted if needed. Signed, Yvone Neu. Reyne Dumas, RPVI [facility] Radiology Electronically Signed   By: Gilmer Mor D.O.   On: 02/18/2021 14:21   IR US Guide Vasc Access Right  Result Date: 02/18/2021 INDICATION: 55 year old female referred for temporary hemodialysis catheter placement EXAM: IMAGE GUIDED PLACEMENT OF TEMPORARY HEMODIALYSIS CATHETER MEDICATIONS: None ANESTHESIA/SEDATION: Moderate  (conscious) sedation was employed during this procedure. A total of Versed 1.0 mg and Fentanyl 50 mcg was administered intravenously. Moderate Sedation Time: 13 minutes. The patient's level of consciousness and vital signs were monitored continuously by radiology nursing throughout the procedure under my direct supervision. FLUOROSCOPY TIME:  Fluoroscopy Time: 0 minutes 6 seconds (1 mGy). COMPLICATIONS: None PROCEDURE: Informed written consent was obtained from the patient and the patient's family after a thorough discussion of the procedural risks, benefits and alternatives. All questions were addressed. A timeout was performed prior to the initiation of the procedure. The right neck and chest was prepped with chlorhexidine, and draped in the usual sterile fashion using maximum barrier technique (cap and mask, sterile gown, sterile gloves, large sterile sheet, hand hygiene and cutaneous antiseptic). Local anesthesia was attained by infiltration with 1% lidocaine without epinephrine. Ultrasound demonstrated patency of the right internal jugular vein, and this was documented with an image. Under real-time ultrasound guidance, this vein was accessed with a 21 gauge micropuncture needle and image documentation was performed. A small dermatotomy was made at the access site with an 11 scalpel. A 0.018" wire was advanced into the SVC and the access needle exchanged for a 40F micropuncture vascular sheath. The 0.018" wire was then removed and a 0.035" wire advanced into the IVC. Upon withdrawal of the 018 wire, the wire was marked for appropriate length of the internal portion of the catheter. A 16 cm catheter was selected. Skin and subcutaneous tissues were serially dilated. Catheter was placed on the wire. The catheter tip is  positioned in the upper right atrium. This was documented with a spot image. Both ports of the hemodialysis catheter were then tested for excellent function. The ports were then locked with  heparinized lock. Patient tolerated the procedure well and remained hemodynamically stable throughout. No complications were encountered and no significant blood loss was encountered. IMPRESSION: Status post image guided placement of temporary hemodialysis catheter. Catheter may be converted if needed. Signed, Yvone Neu. Reyne Dumas, RPVI [facility] Radiology Electronically Signed   By: Gilmer Mor D.O.   On: 02/18/2021 14:21   IR US Guidance  Result Date: 02/24/2021 CLINICAL DATA:  Renal failure and need for hemodialysis. The patient currently has a non tunneled temporary dialysis catheter via the right internal jugular vein and requires a tunneled dialysis catheter for longer-term hemodialysis. EXAM: TUNNELED CENTRAL VENOUS HEMODIALYSIS CATHETER PLACEMENT WITH ULTRASOUND AND FLUOROSCOPIC GUIDANCE ANESTHESIA/SEDATION: 1.0 mg IV Versed; 50 mcg IV Fentanyl. Total Moderate Sedation Time:   25 minutes. The patient's level of consciousness and physiologic status were continuously monitored during the procedure by Radiology nursing. MEDICATIONS: 3 g IV Ancef. FLUOROSCOPY TIME:  24 seconds.  2.5 mGy. PROCEDURE: The procedure, risks, benefits, and alternatives were explained to the patient. Questions regarding the procedure were encouraged and answered. The patient understands and consents to the procedure. A timeout was performed prior to initiating the procedure. The pre-existing non tunneled right jugular temporary dialysis catheter was removed and hemostasis obtained with manual compression over the exit site. Ultrasound was used to confirm patency of the right internal jugular vein after non tunneled catheter removal. The right neck and chest were prepped with chlorhexidine in a sterile fashion, and a sterile drape was applied covering the operative field. Maximum barrier sterile technique with sterile gowns and gloves were used for the procedure. Local anesthesia  was provided with 1% lidocaine. After creating a small venotomy incision, a 21 gauge needle was advanced into the right internal jugular vein under direct, real-time ultrasound guidance. Ultrasound image documentation was performed. After securing guidewire access, an 8 Fr dilator was placed. A J-wire was kinked to measure appropriate catheter length. A Palindrome tunneled hemodialysis catheter measuring 19 cm from tip to cuff was chosen for placement. This was tunneled in a retrograde fashion from the chest wall to the venotomy incision. At the venotomy, serial dilatation was performed and a 15 Fr peel-away sheath was placed over a guidewire. The catheter was then placed through the sheath and the sheath removed. Final catheter positioning was confirmed and documented with a fluoroscopic spot image. The catheter was aspirated, flushed with saline, and injected with appropriate volume heparin dwells. The venotomy incision was closed with subcuticular 4-0 Vicryl. Dermabond was applied to the incision. The catheter exit site was secured with 0-Prolene retention sutures. COMPLICATIONS: None.  No pneumothorax. FINDINGS: After catheter placement, the tip lies in the right atrium. The catheter aspirates normally and is ready for immediate use. IMPRESSION: Placement of tunneled hemodialysis catheter via the right internal jugular vein. The catheter tip lies in the right atrium. The catheter is ready for immediate use. Electronically Signed   By: Irish Lack M.D.   On: 02/24/2021 12:01   ECHOCARDIOGRAM COMPLETE  Result Date: 02/17/2021    ECHOCARDIOGRAM REPORT   Patient Name:   MARYELIZABETH EBERLE Date of Exam: 02/17/2021 Medical Rec #:  [id number]       Height:       65.0 in Accession #:    [id number]  Weight:       240.0 lb Date of Birth:  1966-05-16        BSA:          2.138 m Patient Age:    54 years        BP:           120/60 mmHg Patient Gender: F               HR:           67 bpm. Exam Location:  Inpatient  Procedure: 2D Echo Indications:    CHF-Acute Diastolic I50.31  History:        Patient has no prior history of Echocardiogram examinations.  Sonographer:    Thurman Coyer RDCS (AE) Referring Phys: [id number] [facility] IMPRESSIONS  1. Left ventricular ejection fraction, by estimation, is 60%. The left ventricle has normal function. The left ventricle has no regional wall motion abnormalities. There is mild left ventricular hypertrophy. Left ventricular diastolic parameters were low normal.  2. Right ventricular systolic function is normal. The right ventricular size is normal. Tricuspid regurgitation signal is inadequate for assessing PA pressure.  3. The mitral valve is normal in structure. Trivial mitral valve regurgitation. No evidence of mitral stenosis.  4. The aortic valve is tricuspid. Aortic valve regurgitation is not visualized. No aortic stenosis is present.  5. The inferior vena cava is normal in size with greater than 50% respiratory variability, suggesting right atrial pressure of 3 mmHg. FINDINGS  Left Ventricle: Left ventricular ejection fraction, by estimation, is 60%. The left ventricle has normal function. The left ventricle has no regional wall motion abnormalities. The left ventricular internal cavity size was normal in size. There is mild left ventricular hypertrophy. Left ventricular diastolic parameters were normal. Right Ventricle: The right ventricular size is normal. No increase in right ventricular wall thickness. Right ventricular systolic function is normal. Tricuspid regurgitation signal is inadequate for assessing PA pressure. Left Atrium: Left atrial size was normal in size. Right Atrium: Right atrial size was normal in size. Pericardium: There is no evidence of pericardial effusion. Mitral Valve: The mitral valve is normal in structure. Trivial mitral valve regurgitation. No evidence of mitral valve stenosis. Tricuspid Valve: The tricuspid valve is normal in structure. Tricuspid  valve regurgitation is trivial. No evidence of tricuspid stenosis. Aortic Valve: The aortic valve is tricuspid. Aortic valve regurgitation is not visualized. No aortic stenosis is present. Pulmonic Valve: The pulmonic valve was normal in structure. Pulmonic valve regurgitation is trivial. No evidence of pulmonic stenosis. Aorta: The aortic root is normal in size and structure. Venous: The inferior vena cava is normal in size with greater than 50% respiratory variability, suggesting right atrial pressure of 3 mmHg. IAS/Shunts: The interatrial septum was not well visualized.  LEFT VENTRICLE PLAX 2D LVIDd:         4.10 cm  Diastology LVIDs:         2.80 cm  LV e' medial:    6.53 cm/s LV PW:         1.20 cm  LV E/e' medial:  11.0 LV IVS:        1.20 cm  LV e' lateral:   9.57 cm/s LVOT diam:     2.10 cm  LV E/e' lateral: 7.5 LV SV:         76 LV SV Index:   36 LVOT Area:     3.46 cm  RIGHT VENTRICLE RV S prime:  15.40 cm/s TAPSE (M-mode): 1.4 cm LEFT ATRIUM             Index       RIGHT ATRIUM          Index LA diam:        3.10 cm 1.45 cm/m  RA Area:     9.08 cm LA Vol (A2C):   27.6 ml 12.91 ml/m RA Volume:   15.80 ml 7.39 ml/m LA Vol (A4C):   39.6 ml 18.53 ml/m LA Biplane Vol: 35.3 ml 16.51 ml/m  AORTIC VALVE LVOT Vmax:   107.00 cm/s LVOT Vmean:  64.800 cm/s LVOT VTI:    0.220 m  AORTA Ao Root diam: 2.80 cm MITRAL VALVE MV Area (PHT): 2.42 cm    SHUNTS MV Decel Time: 313 msec    Systemic VTI:  0.22 m MV E velocity: 71.60 cm/s  Systemic Diam: 2.10 cm MV A velocity: 73.30 cm/s MV E/A ratio:  0.98 Weston Brass MD Electronically signed by Weston Brass MD Signature Date/Time: 02/17/2021/1:13:33 PM    Final    US BIOPSY (KIDNEY)  Result Date: 02/20/2021 INDICATION: 80 year old with acute kidney injury and request for renal biopsy. EXAM: ULTRASOUND-GUIDED RANDOM RENAL BIOPSY MEDICATIONS: Moderate sedation none. ANESTHESIA/SEDATION: Moderate (conscious) sedation was employed during this procedure. A total  of Versed 1.0 mg and Fentanyl 50 mcg was administered intravenously. Moderate Sedation Time: 15 minutes. The patient's level of consciousness and vital signs were monitored continuously by radiology nursing throughout the procedure under my direct supervision. FLUOROSCOPY TIME:  None COMPLICATIONS: None immediate. PROCEDURE: Informed written consent was obtained from the patient after a thorough discussion of the procedural risks, benefits and alternatives. All questions were addressed. A timeout was performed prior to the initiation of the procedure. Patient was placed prone. Both kidneys were evaluated with ultrasound. The right kidney was targeted for biopsy. The right flank was prepped with chlorhexidine and sterile field was created. Maximal barrier sterile technique was utilized including caps, mask, sterile gowns, sterile gloves, sterile drape, hand hygiene and skin antiseptic. Skin was anesthetized using 1% lidocaine. A small incision was made. Using ultrasound guidance, 16 gauge core biopsy was directed into the right kidney lower pole. Adequate specimen was obtained and placed in saline. A second ultrasound-guided core biopsy was obtained from the right kidney lower pole. Adequate specimen was obtained and placed in saline. Bandage placed over the puncture site. FINDINGS: Negative for hydronephrosis. Two core biopsies obtained from the right kidney lower pole. No significant bleeding or hematoma formation. IMPRESSION: Successful ultrasound-guided random core biopsies from the right kidney lower pole. Electronically Signed   By: Richarda Overlie M.D.   On: 02/20/2021 17:20   US Abdomen Limited RUQ (LIVER/GB)  Result Date: 02/20/2021 CLINICAL DATA:  Transaminitis EXAM: ULTRASOUND ABDOMEN LIMITED RIGHT UPPER QUADRANT COMPARISON:  CT abdomen and pelvis 02/16/2021 FINDINGS: Gallbladder: Normally distended. 6 mm nonshadowing intraluminal echogenic focus likely small polyp; no follow-up imaging recommended. No  gallbladder wall thickening, pericholecystic fluid or sonographic Murphy sign. No discrete shadowing calculi. Common bile duct: Diameter: 2 mm Liver: Slightly heterogeneous echogenicity, nonspecific. No discrete hepatic mass or nodularity. No intrahepatic biliary dilatation. Portal vein is patent on color Doppler imaging with normal direction of blood flow towards the liver. Other: No RIGHT upper quadrant free fluid. IMPRESSION: 6 mm gallbladder polyp; no follow-up imaging recommended. Nonspecific mildly heterogeneous hepatic parenchymal echogenicity without mass or biliary dilatation. Electronically Signed   By: Ulyses Southward M.D.   On: 02/20/2021 18:13

## 2021-02-28 NOTE — Progress Notes (Signed)
[facility] NEPHROLOGY PROGRESS NOTE  Assessment/ Plan: Pt is a 55 y.o. yo female with history of psoriasis, COVID-19 infection, recent diagnosis of HTN restarted HCTZ presented with generalized body pain, shortness of breath and found to have AKI.  Acute kidney injury with nephrotic syndrome, dialysis-dependent: The acute elevation of serum creatinine level to 10 from normal creatinine about a month ago. The urine study with proteinuria around 6g, serum albumin 1.5 and trace edema. No microscopic hematuria or WBC in the first urinalysis.   Repeat urinalysis was after placement of Foley catheter.  Kidney ultrasound without hydronephrosis. The lab result came back with elevated antidsDNA >300 with low C3 and C4 and high ANA titers. 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  Full house staining for immunoglobins and complements as well as tubular basement membrane deposits.  Her GN has low activity and low chronicity. Intense full house staining. Her acute tubular injury is likely related to collapsing glomerulopathy. Final biopsy report pending. -We started solumedrol 250 mg IV on 3/19 for 3 days and now on prednisone 60 mg as of 3/22, plan to taper in 2 weeks, plan to taper to 40mg  on 4/5. Further IS as below -Started dialysis on 3/19 for oliguric AKI and azotemia. RIJ temp HD catheter placed by IR. -TDC placed 3/25 with IR. Holding on consult for VVS for AVF/AVG since she is an AKI patient -HD on TTS sched > outpt AKI HD being arranged.  Next HD today. Will follow - her UOP is improving and her labs look a little better today so may be recovering some function. - Follow I/Os   Lupus Nephritis Class 3 -full house staining, low chronicity/low activity -Continue with prednisone 60 mg, plan to taper to 40 mg on 4/5 -started on CellCept, low-dose 250mg  bid  on 3/24.  Side effect profile reviewed with patient on 3/24.  Can titrate her dose upwards about every week or so.  Now with pancreatitis ? Etiology - case reports of MMF assoc pancreatitis but abd pain preceded medication so I will plan to continue it for now.   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  I did not expect this histopathological finding on her biopsy.  I do wonder if there is genetic component (APOL1 mutation) to this or if this is related to her Covid infection back in January (however this was a presumed diagnosis, was not tested). Interestingly, her father was on dialysis by the age of 62 until he passed in his 45's, cause of his ESRD was unknown and thought to be secondary to HTN, wonder if he had a similar presentation.   -Nonetheless this is an overall poor kidney prognostic sign.  She is on steroids for LN which we will continue also for this.  Final biopsy report pending  Hypertension: Blood pressure acceptable.  UF as tolerated  Exertional shortness of breath, improved: Chest x-ray negative.    Echo with EF of 60%.  UF with HD.  Transaminitis: work up per primary. improved  Hyperkalemia: mild, 2K dialysate today  Nausea/vomiting/abd pain: imaging with pancreatitis.  Triglycerides ok.  IgG4 pending. There are reports of pancreatitis with both steroids and MMF but her abd pain preceeded both per Dr. 26.    Subjective: Seen and examined at bedside. Sister present.  Feeling ok.  Abd pain improving.  Making urine, not being measured - volume up  over past few days.   Objective Vital signs in last 24 hours: Vitals:   02/26/21 2059 02/27/21 1344 02/27/21 2012 02/28/21 0410  BP: (!) 147/79 (!) 143/74 (!) 154/77 (!) 145/75  Pulse: 70 75 78 75  Resp: 18 18 19 17   Temp: 98.5 F (36.9 C) 98.6 F (37 C) 98.5 F (36.9 C) 98.6 F (37 C)  TempSrc: Oral Oral Oral   SpO2: 98% 99% 100% 100%  Weight:      Height:       Weight  change:   Intake/Output Summary (Last 24 hours) at 02/28/2021 1303 Last data filed at 02/27/2021 2134 Gross per 24 hour  Intake 240 ml  Output --  Net 240 ml       Labs: Basic Metabolic Panel: Recent Labs  Lab 02/24/21 0313 02/25/21 0359 02/26/21 0500 02/27/21 0420 02/28/21 0553  NA 134*   < > 134* 134* 135  K 4.8   < > 4.6 4.5 5.2*  CL 98   < > 97* 99 99  CO2 26   < > 27 27 28   GLUCOSE 145*   < > 207* 154* 181*  BUN 47*   < > 40* 62* 76*  CREATININE 8.55*   < > 6.59* 6.78* 5.20*  CALCIUM 7.7*   < > 7.7* 8.0* 8.1*  PHOS 7.0*  --   --   --   --    < > = values in this interval not displayed.   Liver Function Tests: Recent Labs  Lab 02/26/21 0500 02/27/21 0420 02/28/21 0553  AST 50* 40 41  ALT 22 16 21   ALKPHOS 67 63 67  BILITOT 0.5 0.7 0.4  PROT 5.5* 5.7* 5.5*  ALBUMIN 1.9* 2.0* 2.0*   Recent Labs  Lab 02/26/21 2013 02/27/21 0420  LIPASE 178* 165*   Recent Labs  Lab 02/25/21 0359  AMMONIA 14   CBC: Recent Labs  Lab 02/24/21 0313 02/25/21 0359 02/26/21 0500 02/27/21 0420 02/28/21 0553  WBC 14.4* 9.3 11.2* 12.3* 11.2*  HGB 11.0* 10.5* 9.2* 9.1* 8.7*  HCT 33.6* 32.0* 29.2* 28.0* 27.6*  MCV 90.3 91.7 93.0 92.7 93.6  PLT 134* 125* 104* 125* 130*   Cardiac Enzymes: No results for input(s): CKTOTAL, CKMB, CKMBINDEX, TROPONINI in the last 168 hours. CBG: No results for input(s): GLUCAP in the last 168 hours.  Iron Studies: No results for input(s): IRON, TIBC, TRANSFERRIN, FERRITIN in the last 72 hours. Studies/Results: CT ABDOMEN PELVIS WO CONTRAST  Result Date: 02/26/2021 CLINICAL DATA:  Abdominal pain. EXAM: CT ABDOMEN AND PELVIS WITHOUT CONTRAST TECHNIQUE: Multidetector CT imaging of the abdomen and pelvis was performed following the standard protocol without IV contrast. COMPARISON:  02/16/2021 FINDINGS: Lower chest: No acute abnormality. Hepatobiliary: No solid liver abnormality is seen. No gallstones, gallbladder wall thickening, or biliary  dilatation. Pancreas: Very extensive inflammatory fat stranding and fluid about the pancreas, adjacent retroperitoneum, and mesentery. There is no obvious focal fluid collection on noncontrast examination. Spleen: Normal in size without significant abnormality. Adrenals/Urinary Tract: Adrenal glands are unremarkable. Kidneys are normal, without renal calculi, solid lesion, or hydronephrosis. Bladder is unremarkable. Stomach/Bowel: Stomach is within normal limits. Appendix appears normal. No evidence of bowel wall thickening, distention, or inflammatory changes. Vascular/Lymphatic: No significant vascular findings are present. No enlarged abdominal or pelvic lymph nodes. Reproductive: No mass or other significant abnormality. Other: No abdominal wall hernia or abnormality. No abdominopelvic ascites. Musculoskeletal: No acute or significant osseous findings. IMPRESSION: Very extensive inflammatory fat stranding and  fluid about the pancreas, adjacent retroperitoneum, and mesentery, consistent with acute pancreatitis. No obvious focal fluid collection or evidence of necrosis on noncontrast examination. Electronically Signed   By: Lauralyn Primes M.D.   On: 02/26/2021 15:42    Medications: Infusions:   Scheduled Medications: . Chlorhexidine Gluconate Cloth  6 each Topical Q0600  . famotidine  10 mg Oral Daily  . feeding supplement (NEPRO CARB STEADY)  237 mL Oral TID BM  . heparin injection (subcutaneous)  5,000 Units Subcutaneous Q8H  . kidney failure book   Does not apply Once  . mycophenolate  250 mg Oral BID  . polyethylene glycol  17 g Oral BID  . predniSONE  60 mg Oral Q breakfast   I have reviewed scheduled and prn medications.  Physical Exam: General:NAD ENT: no thrush, torus palitini Heart:RRR, s1s2 nl Lungs:  no iwob Abdomen:soft, Non-tender, non-distended, mild TTP epigastrium Extremities: no pedal/ankle edema b/l Dialysis Access: Right tunneled HD catheter c/d/i  Tyler Pita 02/28/2021,1:03 PM  LOS: 12 days

## 2021-02-28 NOTE — Progress Notes (Signed)
Followed up briefly with patient and met her sister at bedside as patient was about to be taken to HD unit for treatment. Navigator asked if she had gotten her insurance card from her husband last night as we had talked about (copy requested by [facility] Admissions during referral), but patient said no. Patient's sister immediately texted patient's husband and stated that she would follow up with Navigator.  Navigator awaiting financial clearance given AKI dx.   Alphonzo Cruise, [facility]igator [phone]

## 2021-03-01 ENCOUNTER — Other Ambulatory Visit: Payer: Self-pay | Admitting: Internal Medicine

## 2021-03-01 DIAGNOSIS — I10 Essential (primary) hypertension: Secondary | ICD-10-CM

## 2021-03-01 LAB — RENAL FUNCTION PANEL
Albumin: 2 g/dL — ABNORMAL LOW (ref 3.5–5.0)
Anion gap: 6 (ref 5–15)
BUN: 35 mg/dL — ABNORMAL HIGH (ref 6–20)
CO2: 29 mmol/L (ref 22–32)
Calcium: 7.8 mg/dL — ABNORMAL LOW (ref 8.9–10.3)
Chloride: 101 mmol/L (ref 98–111)
Creatinine, Ser: 2.89 mg/dL — ABNORMAL HIGH (ref 0.44–1.00)
GFR, Estimated: 19 mL/min — ABNORMAL LOW (ref >60)
Glucose, Bld: 135 mg/dL — ABNORMAL HIGH (ref 70–99)
Phosphorus: 4.4 mg/dL (ref 2.5–4.6)
Potassium: 3.8 mmol/L (ref 3.5–5.1)
Sodium: 136 mmol/L (ref 135–145)

## 2021-03-01 LAB — CBC
HCT: 25.6 % — ABNORMAL LOW (ref 36.0–46.0)
Hemoglobin: 8.4 g/dL — ABNORMAL LOW (ref 12.0–15.0)
MCH: 30.4 pg (ref 26.0–34.0)
MCHC: 32.8 g/dL (ref 30.0–36.0)
MCV: 92.8 fL (ref 80.0–100.0)
Platelets: 105 10*3/uL — ABNORMAL LOW (ref 150–400)
RBC: 2.76 MIL/uL — ABNORMAL LOW (ref 3.87–5.11)
RDW: 14 % (ref 11.5–15.5)
WBC: 10.6 10*3/uL — ABNORMAL HIGH (ref 4.0–10.5)
nRBC: 0 % (ref 0.0–0.2)

## 2021-03-01 MED ORDER — PANTOPRAZOLE SODIUM 40 MG PO TBEC: 40.0000 mg | DELAYED_RELEASE_TABLET | Freq: Every day | ORAL | 0 refills | Status: AC

## 2021-03-01 MED ORDER — INFLUENZA VAC SPLIT QUAD 0.5 ML IM SUSY
0.5000 mL | PREFILLED_SYRINGE | INTRAMUSCULAR | Status: AC | PRN
  Administered 2021-03-01: 0.5 mL via INTRAMUSCULAR
  Filled 2021-03-01: qty 0.5

## 2021-03-01 MED ORDER — PREDNISONE 20 MG PO TABS: ORAL_TABLET | ORAL | 0 refills | Status: AC

## 2021-03-01 MED ORDER — MYCOPHENOLATE MOFETIL 250 MG PO CAPS: 250.0000 mg | ORAL_CAPSULE | Freq: Two times a day (BID) | ORAL | 0 refills | Status: AC

## 2021-03-01 MED FILL — PANTOPRAZOLE SOD DR 40 MG T: 40 | 30 days supply | Qty: 30 | Fill #0

## 2021-03-01 MED FILL — predniSONE 20 MG TABS: 20 | 30 days supply | Qty: 100 | Fill #0

## 2021-03-01 MED FILL — MYCOPHENOLATE 250 MG CAP: 250 | 30 days supply | Qty: 60 | Fill #0

## 2021-03-01 NOTE — Progress Notes (Signed)
[facility] NEPHROLOGY PROGRESS NOTE  Assessment/ Plan: Pt is a 55 y.o. yo female with history of psoriasis, COVID-19 infection, recent diagnosis of HTN restarted HCTZ presented with generalized body pain, shortness of breath and found to have AKI.  Acute kidney injury with nephrotic syndrome, dialysis-dependent: The acute elevation of serum creatinine level to 10 from normal creatinine about a month ago. The urine study with proteinuria around 6g, serum albumin 1.5 and trace edema. No microscopic hematuria or WBC in the first urinalysis.   Repeat urinalysis was after placement of Foley catheter.  Kidney ultrasound without hydronephrosis. The lab result came back with elevated antidsDNA >300 with low C3 and C4 and high ANA titers. 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  Full house staining for immunoglobins and complements as well as tubular basement membrane deposits.  Her GN has low activity and low chronicity. Intense full house staining. Her acute tubular injury is likely related to collapsing glomerulopathy. Final biopsy report pending. -We started solumedrol 250 mg IV on 3/19 for 3 days and now on prednisone 60 mg as of 3/22, plan to taper in 2 weeks, plan to taper to 40mg  on 4/5. Further IS as below -Started dialysis on 3/19 for oliguric AKI and azotemia.  -TDC placed 3/25 with IR. Holding on consult for VVS for AVF/AVG since she is an AKI patient -HD on TTS sched > outpt AKI HD being arranged.  Next HD tomorrow if needed. Will follow - her UOP is improving and her labs look a little better yesterday so may be recovering some function. - Follow I/Os   Lupus Nephritis Class 3 -full house staining, low chronicity/low activity -Continue with prednisone 60 mg, plan to taper to 40 mg on 4/5 -started on CellCept, low-dose 250mg  bid on 3/24.  Side  effect profile reviewed with patient on 3/24.  Can titrate her dose upwards about every week or so.  Now with pancreatitis ? Etiology - case reports of MMF assoc pancreatitis but abd pain preceded medication so I will plan to continue it for now.   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  I did not expect this histopathological finding on her biopsy.  I do wonder if there is genetic component (APOL1 mutation) to this or if this is related to her Covid infection back in January (however this was a presumed diagnosis, was not tested). Interestingly, her father was on dialysis by the age of 60 until he passed in his 12's, cause of his ESRD was unknown and thought to be secondary to HTN, wonder if he had a similar presentation.   -Nonetheless this is an overall poor kidney prognostic sign.  She is on steroids for LN which we will continue also for this.  Final biopsy report pending  Hypertension: Blood pressure acceptable.  UF as tolerated  Anemia:  Hb in 8s, transfuse < 7. Check iron indices.  May start ESA if iron replete.   Thrombocytopenia: noted 90-140 over past week, stable today.  Cont to monitor for now.  Heparin in catheter only at HD, no IV doses.   Nausea/vomiting/abd pain: imaging with pancreatitis.  Triglycerides ok.  IgG4 pending. There are reports of pancreatitis with both steroids and MMF but her abd pain preceeded both.  Clinically improved today.  Subjective: Seen and examined at bedside. Sister present.  Feeling ok.  Abd pain improving.  Making  urine, not being measured - volume of urine increased past few days.  D/w RN to obtain hat for commode and follow I/os.  No new issues.   Objective Vital signs in last 24 hours: Vitals:   02/28/21 1730 02/28/21 2028 02/28/21 2109 03/01/21 0628  BP: (!) 161/87  (!) 144/78 139/84  Pulse: 67  79 82  Resp: 18  16 18   Temp: 98.1 F (36.7 C) 98.5 F (36.9 C)  98.3 F (36.8 C)  TempSrc: Oral  Oral  Axillary  SpO2: 100%  100% 98%  Weight:      Height:       Weight change:   Intake/Output Summary (Last 24 hours) at 03/01/2021 0953 Last data filed at 02/28/2021 1649 Gross per 24 hour  Intake --  Output 0 ml  Net 0 ml       Labs: Basic Metabolic Panel: Recent Labs  Lab 02/24/21 0313 02/25/21 0359 02/27/21 0420 02/28/21 0553 03/01/21 0213  NA 134*   < > 134* 135 136  K 4.8   < > 4.5 5.2* 3.8  CL 98   < > 99 99 101  CO2 26   < > 27 28 29   GLUCOSE 145*   < > 154* 181* 135*  BUN 47*   < > 62* 76* 35*  CREATININE 8.55*   < > 6.78* 5.20* 2.89*  CALCIUM 7.7*   < > 8.0* 8.1* 7.8*  PHOS 7.0*  --   --   --  4.4   < > = values in this interval not displayed.   Liver Function Tests: Recent Labs  Lab 02/26/21 0500 02/27/21 0420 02/28/21 0553 03/01/21 0213  AST 50* 40 41  --   ALT 22 16 21   --   ALKPHOS 67 63 67  --   BILITOT 0.5 0.7 0.4  --   PROT 5.5* 5.7* 5.5*  --   ALBUMIN 1.9* 2.0* 2.0* 2.0*   Recent Labs  Lab 02/26/21 2013 02/27/21 0420  LIPASE 178* 165*   Recent Labs  Lab 02/25/21 0359  AMMONIA 14   CBC: Recent Labs  Lab 02/25/21 0359 02/26/21 0500 02/27/21 0420 02/28/21 0553 03/01/21 0213  WBC 9.3 11.2* 12.3* 11.2* 10.6*  HGB 10.5* 9.2* 9.1* 8.7* 8.4*  HCT 32.0* 29.2* 28.0* 27.6* 25.6*  MCV 91.7 93.0 92.7 93.6 92.8  PLT 125* 104* 125* 130* 105*   Cardiac Enzymes: No results for input(s): CKTOTAL, CKMB, CKMBINDEX, TROPONINI in the last 168 hours. CBG: No results for input(s): GLUCAP in the last 168 hours.  Iron Studies: No results for input(s): IRON, TIBC, TRANSFERRIN, FERRITIN in the last 72 hours. Studies/Results: MR ABDOMEN MRCP WO CONTRAST  Result Date: 02/28/2021 CLINICAL DATA:  Pancreatitis EXAM: MRI ABDOMEN WITHOUT CONTRAST  (INCLUDING MRCP) TECHNIQUE: Multiplanar multisequence MR imaging of the abdomen was performed. Heavily T2-weighted images of the biliary and pancreatic ducts were obtained, and three-dimensional MRCP  images were rendered by post processing. COMPARISON:  CT abdomen 02/26/2021 FINDINGS: Lower chest: Linear subsegmental atelectasis or scarring posteriorly in the right lower lobe. Hepatobiliary: No biliary dilatation. No filling defect in the common bile duct to suggest choledocholithiasis. Gallbladder unremarkable. No significant focal parenchymal lesion is identified on today's noncontrast examination. Pancreas: Prominent peripancreatic edema compatible with acute pancreatitis with acute fluid collections. Surrounding mesenteric edema tracking along tissue planes, including the retroperitoneum and right perirenal space as well as the right psoas muscle. No obvious walled-off collection to indicate pseudocyst or abscess, sensitivity and specificity mildly  adversely affected by the lack of IV contrast. Assessment for pancreatic necrosis indeterminate due to lack of IV contrast. Upper normal caliber of the dorsal pancreatic duct without overt dilatation. Spleen:  Unremarkable Adrenals/Urinary Tract:  Unremarkable Stomach/Bowel: Mild secondary inflammation of the second and third portions of the duodenum related to the pancreatitis. No dilated bowel. Vascular/Lymphatic:  No pathologic adenopathy. Other:  No supplemental non-categorized findings. Musculoskeletal: Low-grade bilateral flank edema. IMPRESSION: 1. Acute pancreatitis with acute peripancreatic fluid collections. No finding suggestive of pseudocyst or abscess. Without IV contrast, assessment for pancreatic necrosis is problematic. Expected edema tracking in the peripancreatic region and also extending along the right retroperitoneum. 2. No biliary dilatation or findings of choledocholithiasis. Electronically Signed   By: Gaylyn Rong M.D.   On: 02/28/2021 14:17    Medications: Infusions:   Scheduled Medications: . bisacodyl  10 mg Oral Once  . Chlorhexidine Gluconate Cloth  6 each Topical Q0600  . famotidine  10 mg Oral Daily  . feeding  supplement (NEPRO CARB STEADY)  237 mL Oral TID BM  . heparin injection (subcutaneous)  5,000 Units Subcutaneous Q8H  . kidney failure book   Does not apply Once  . mycophenolate  250 mg Oral BID  . polyethylene glycol  17 g Oral BID  . predniSONE  60 mg Oral Q breakfast   I have reviewed scheduled and prn medications.  Physical Exam: General:NAD ENT: MMM Heart:RRR, s1s2 nl Lungs:  no iwob Abdomen:soft, Non-tender, non-distended, nontender now Extremities: no pedal/ankle edema b/l Dialysis Access: Right tunneled HD catheter c/d/i  Denise Watson 03/01/2021,9:53 AM  LOS: 13 days

## 2021-03-01 NOTE — Progress Notes (Signed)
[name] to be D/C'd Home per MD order.  Discussed with the patient and all questions fully answered.  VSS, Skin clean, dry and intact without evidence of skin break down, no evidence of skin tears noted. IV catheter discontinued intact. Site without signs and symptoms of complications. Dressing and pressure applied.  An After Visit Summary was printed and given to the patient. Patient received prescription medication.  D/c education completed with patient/family including follow up instructions, medication list, d/c activities limitations if indicated, with other d/c instructions as indicated by MD - patient able to verbalize understanding, all questions fully answered.   Patient instructed to return to ED, call 911, or call MD for any changes in condition.   Patient escorted via WC, and D/C home via private auto.  Velva Harman 03/01/2021 12:58 PM

## 2021-03-01 NOTE — Progress Notes (Signed)
PT Cancellation Note  Patient Details Name: Denise Watson MRN: [id number] DOB: 29-Jun-1966   Cancelled Treatment:    Reason Eval/Treat Not Completed: (P) Fatigue/lethargy limiting ability to participate Pt reports just having worked with OT and being tired and "I just don't feel well." Pt request come back later this afternoon. PT will follow back as able.   Zaide Kardell B. Beverely Risen PT, DPT Acute Rehabilitation Services Pager [phone] Office [phone]    Elon Alas Fleet 03/01/2021, 11:25 AM

## 2021-03-01 NOTE — Progress Notes (Signed)
Patient has been financially cleared by [employer] team and given a TTS schedule with a 6:45am seat time at [facility]. Nephrologist/Dr. Johnney Ou notified, as well as Attending/Dr. Nena Alexander. Per Attending, patient is medically ready for discharge today. Therefore, she needs to arrive to the clinic at 6:00am tomorrow to complete intake paperwork prior to her first treatment.  Navigator met with patient to provide information verbally and in writing. Patient's sister was with her today and both state understanding and appreciation. Clinic requests that patient receive flu shot prior to discharge and patient agrees. Navigator asked patient to consider COVID booster also. She is thinking about it. Her sister suggests to patient that she receive it before she leaves.  Navigator updated [facility] and [facility] of patient's discharge today and start in the clinic tomorrow and asked Renal PA/D. Zeyfang to send orders.  Alphonzo Cruise, [place] [facility] Navigator [phone]

## 2021-03-01 NOTE — Discharge Summary (Signed)
PATIENT DETAILS Name: Breeanna Galgano Age: 55 y.o. Sex: female Date of Birth: 05-27-1966 MRN: [id number]. Admitting Physician: Leroy Sea, MD WUJ:[id number], Deloria Lair, [place]  Admit Date: 02/16/2021 Discharge date: 03/01/2021  Recommendations for Outpatient Follow-up:  1. Follow up with PCP in 1-2 weeks 2. Please obtain CMP/CBC in one week 3. Please ensure follow-up with nephrology, rheumatology  Admitted From:  Home  Disposition: Home with home health services   Home Health:  Yes  Equipment/Devices: None  Discharge Condition: Stable  CODE STATUS: FULL CODE  Diet recommendation:  Diet Order            Diet - low sodium heart healthy           Diet renal with fluid restriction Fluid restriction: 1200 mL Fluid; Room service appropriate? Yes; Fluid consistency: Thin  Diet effective now                  Brief Summary: See H&P, Labs, Consult and Test reports for all details in brief, patient is a 55 year old female with history of HTN-psoriasis-who presented with fatigue/shortness of breath-upon further evaluation-she was found to have AKI.  See below for further details.  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  Nephrology followed closely-underwent hemodialysis- Outpatient hemodialysis has been arranged-nephrology will continue to follow and adjust her medications in the outpatient setting.  Lupus nephritis class III: Per nephrology-low chronicity/slow activity on biopsy-on prednisone and CellCept.  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  SLE: New diagnosis during this admission-consider outpatient rheumatology follow-up-however already on CellCept/prednisone  HTN: BP acceptable-being managed primarily with HD-antihypertensives on hold.  Anemia: Due to CKD/acute illness-no evidence of blood loss.   Nephrology following-defer-/iron per nephrology.  Thrombocytopenia: Mild-continue to watch closely.  Pancreatitis: Clinically resolved.  No gallstones seen on CT abdomen/ultrasound.  IgG4 levels were normal limits.  History of psoriasis: Continue outpatient dermatology follow-up  6 mm gallbladder polyp: Seen incidentally on abdominal ultrasound  Obesity: Estimated body mass index is 48.57 kg/m as calculated from the following:   Height as of this encounter: 5\' 5"  (1.651 m).   Weight as of this encounter: 132.4 kg.    Procedures R. 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  Full house staining for immunoglobins and complements as well as tubular basement membrane deposits.  Her GN has low activity and low chronicity. Intense full house staining. Her acute tubular injury is likely related to collapsing glomerulopathy  R.IJ HD Cath by IR 02/18/21  Discharge Diagnoses:  Active Problems:   AKI (acute kidney injury) [facility])   Discharge Instructions:  Activity:  As tolerated   Discharge Instructions    Call MD for:  difficulty breathing, headache or visual disturbances   Complete by: As directed    Diet - low sodium heart healthy   Complete by: As directed    Discharge instructions   Complete by: As directed    Follow with Primary MD  Piedad Climes, [place], PA-C in 1-2 weeks  Please follow-up with nephrology as instructed  Please call rheumatology-number in discharge paperwork-and make yourself an appointment.  Please get a complete blood count and chemistry panel checked by your Primary MD at your next visit, and again as instructed by your Primary MD.  Get Medicines reviewed and adjusted: Please take all your medications with you  for your next visit with your Primary MD  Laboratory/radiological data: Please request your Primary MD to go  over all hospital tests and procedure/radiological results at the follow up, please ask your Primary MD to get all Hospital records sent to his/her office.  In some cases, they will be blood work, cultures and biopsy results pending at the time of your discharge. Please request that your primary care M.D. follows up on these results.  Also Note the following: If you experience worsening of your admission symptoms, develop shortness of breath, life threatening emergency, suicidal or homicidal thoughts you must seek medical attention immediately by calling 911 or calling your MD immediately  if symptoms less severe.  You must read complete instructions/literature along with all the possible adverse reactions/side effects for all the Medicines you take and that have been prescribed to you. Take any new Medicines after you have completely understood and accpet all the possible adverse reactions/side effects.   Do not drive when taking Pain medications or sleeping medications (Benzodaizepines)  Do not take more than prescribed Pain, Sleep and Anxiety Medications. It is not advisable to combine anxiety,sleep and pain medications without talking with your primary care practitioner  Special Instructions: If you have smoked or chewed Tobacco  in the last 2 yrs please stop smoking, stop any regular Alcohol  and or any Recreational drug use.  Wear Seat belts while driving.  Please note: You were cared for by a hospitalist during your hospital stay. Once you are discharged, your primary care physician will handle any further medical issues. Please note that NO REFILLS for any discharge medications will be authorized once you are discharged, as it is imperative that you return to your primary care physician (or establish a relationship with a primary care physician if you do not have one) for your post hospital discharge needs so that they can reassess your need for medications and monitor your lab values.    Increase activity slowly   Complete by: As directed    No wound care   Complete by: As directed      Allergies as of 03/01/2021   No Known Allergies     Medication List    STOP taking these medications   amoxicillin 875 MG tablet Commonly known as: AMOXIL   clotrimazole 1 % cream Commonly known as: LOTRIMIN   clotrimazole-betamethasone cream Commonly known as: LOTRISONE   hydrochlorothiazide 25 MG tablet Commonly known as: HYDRODIURIL     TAKE these medications   ascorbic acid 500 MG tablet Commonly known as: VITAMIN C Take 500 mg by mouth daily.   Cholecalciferol 25 MCG (1000 UT) tablet Take 1,000 Units by mouth daily.   mycophenolate 250 MG capsule Commonly known as: CELLCEPT Take 1 capsule (250 mg total) by mouth 2 (two) times daily.   pantoprazole 40 MG tablet Commonly known as: Protonix Take 1 tablet (40 mg total) by mouth daily.   predniSONE 20 MG tablet Commonly known as: DELTASONE Take 3 tablets (60 mg) p.o. daily, on 4/5 start taking 2 tablets (40 mg) p.o. daily-and stay on until seen by nephrology.   vitamin B-12 500 MCG tablet Commonly known as: CYANOCOBALAMIN Take 500 mcg by mouth daily.            Durable Medical Equipment  (From admission, onward)         Start     Ordered   03/01/21 1117  For home use only DME Walker rolling  Once  Question Answer Comment  Walker: With 5 Inch Wheels   Patient needs a walker to treat with the following condition Physical deconditioning      03/01/21 1116   02/20/21 1109  For home use only DME Walker rolling  Once       Question Answer Comment  Walker: With 5 Inch Wheels   Patient needs a walker to treat with the following condition Generalized weakness      02/20/21 1109   02/20/21 1109  For home use only DME 3 n 1  Once        02/20/21 1109   02/20/21 1109  For home use only DME Other see comment  Once       Comments: Shower seat with arm rests  Question:  Length of Need  Answer:   Lifetime   02/20/21 1109          Follow-up Information    [facility] Follow up.   Why: Your RW, 3in1 and shower chair will be delivered to your room before discharge  Contact information: [address] [place] [address] [phone]        [facility] Follow [facility].   Specialty: Home Health Services Why: A representative from [facility] will contact you to arrange start date and time for your therapy.   Contact information: [address] [place] [address] [phone]        Holladay[place]ulbright, [place] E, [place]. Schedule an appointment as soon as possible for a visit in 1 week(s).   Specialty: Family Medicine Contact information: [phone] [address] [place] [address] [phone]        Casimer LaniusAryal, [place], MD. Schedule an appointment as soon as possible for a visit in 2 week(s).   Specialty: Rheumatology Contact information: [address] [place] [address] [phone]              No Known Allergies   Consultations:   nephrology   Other Procedures/Studies: CT ABDOMEN PELVIS WO CONTRAST  Result Date: 02/26/2021 CLINICAL DATA:  Abdominal pain. EXAM: CT ABDOMEN AND PELVIS WITHOUT CONTRAST TECHNIQUE: Multidetector CT imaging of the abdomen and pelvis was performed following the standard protocol without IV contrast. COMPARISON:  02/16/2021 FINDINGS: Lower chest: No acute abnormality. Hepatobiliary: No solid liver abnormality is seen. No gallstones, gallbladder wall thickening, or biliary dilatation. Pancreas: Very extensive inflammatory fat stranding and fluid about the pancreas, adjacent retroperitoneum, and mesentery. There is no obvious focal fluid collection on noncontrast examination. Spleen: Normal in size without significant abnormality. Adrenals/Urinary Tract: Adrenal glands are unremarkable. Kidneys are normal, without renal calculi, solid lesion, or hydronephrosis. Bladder is  unremarkable. Stomach/Bowel: Stomach is within normal limits. Appendix appears normal. No evidence of bowel wall thickening, distention, or inflammatory changes. Vascular/Lymphatic: No significant vascular findings are present. No enlarged abdominal or pelvic lymph nodes. Reproductive: No mass or other significant abnormality. Other: No abdominal wall hernia or abnormality. No abdominopelvic ascites. Musculoskeletal: No acute or significant osseous findings. IMPRESSION: Very extensive inflammatory fat stranding and fluid about the pancreas, adjacent retroperitoneum, and mesentery, consistent with acute pancreatitis. No obvious focal fluid collection or evidence of necrosis on noncontrast examination. Electronically Signed   By: Lauralyn PrimesAlex  Bibbey M.D.   On: 02/26/2021 15:42   CT ABDOMEN PELVIS WO CONTRAST  Result Date: 02/16/2021 CLINICAL DATA:  Abdominal pain. EXAM: CT ABDOMEN AND PELVIS WITHOUT CONTRAST TECHNIQUE: Multidetector CT imaging of the abdomen and pelvis was performed  following the standard protocol without IV contrast. COMPARISON:  None. FINDINGS: Lower chest: No acute abnormality. Hepatobiliary: No focal liver abnormality is seen. No gallstones, gallbladder wall thickening, or biliary dilatation. Pancreas: Unremarkable. No pancreatic ductal dilatation or surrounding inflammatory changes. Spleen: Normal in size without focal abnormality. Adrenals/Urinary Tract: Adrenal glands appear normal. No hydronephrosis or renal obstruction is noted. No renal or ureteral calculi are noted. Urinary bladder is decompressed secondary to Foley catheter. Minimal inflammatory changes are noted around the right kidney suggesting possible pyelonephritis. Stomach/Bowel: Stomach is within normal limits. Appendix appears normal. No evidence of bowel wall thickening, distention, or inflammatory changes. Vascular/Lymphatic: No significant vascular findings are present. No enlarged abdominal or pelvic lymph nodes. Reproductive: No  adnexal abnormality is noted. Probable small exophytic uterine fibroid is noted arising posteriorly from the uterus. Other: No abdominal wall hernia or abnormality. No abdominopelvic ascites. Musculoskeletal: No acute or significant osseous findings. IMPRESSION: 1. Minimal inflammatory changes are noted around the right kidney suggesting possible pyelonephritis. 2. No hydronephrosis or renal obstruction is noted. No renal or ureteral calculi are noted. 3. Probable small exophytic uterine fibroid is noted arising posteriorly from the uterus. Electronically Signed   By: Lupita Raider M.D.   On: 02/16/2021 18:32   DG Chest 2 View  Result Date: 02/16/2021 CLINICAL DATA:  Shortness of breath. EXAM: CHEST - 2 VIEW COMPARISON:  January 27, 2021. FINDINGS: The heart size and mediastinal contours are within normal limits. Both lungs are clear. No pneumothorax or pleural effusion is noted. The visualized skeletal structures are unremarkable. IMPRESSION: No active cardiopulmonary disease. Electronically Signed   By: Lupita Raider M.D.   On: 02/16/2021 12:53   CT HEAD WO CONTRAST  Result Date: 02/24/2021 CLINICAL DATA:  Delirium EXAM: CT HEAD WITHOUT CONTRAST TECHNIQUE: Contiguous axial images were obtained from the base of the skull through the vertex without intravenous contrast. COMPARISON:  01/27/2021 FINDINGS: Brain: There is no acute intracranial hemorrhage, mass effect, or edema. Gray-white differentiation is preserved. There is no extra-axial fluid collection. Ventricles and sulci are within normal limits in size and configuration. Vascular: No hyperdense vessel or unexpected calcification. Skull: Calvarium is unremarkable. Sinuses/Orbits: No acute finding. Other: Partially empty sella. IMPRESSION: No acute intracranial abnormality. Electronically Signed   By: Guadlupe Spanish M.D.   On: 02/24/2021 15:59   US RENAL  Result Date: 02/21/2021 CLINICAL DATA:  Acute renal insufficiency EXAM: RENAL / URINARY  TRACT ULTRASOUND COMPLETE COMPARISON:  02/20/2021 right upper quadrant ultrasound. Renal ultrasound 02/16/2021. FINDINGS: Right Kidney: Renal measurements: 12.3 by 6.0 x 5.5 cm = volume: 213 mL. Increased renal echogenicity. No hydronephrosis. Left Kidney: Renal measurements: 10.0 x 5.3 x 4.4 cm = volume: 128 mL. Increased renal echogenicity. No hydronephrosis. Bladder: Collapsed around a Foley catheter. Other: None. IMPRESSION: No hydronephrosis. Increased renal echogenicity, suggesting medical renal disease. Electronically Signed   By: Jeronimo Greaves M.D.   On: 02/21/2021 21:36   US Renal  Result Date: 02/16/2021 CLINICAL DATA:  Renal failure EXAM: RENAL / URINARY TRACT ULTRASOUND COMPLETE COMPARISON:  CT 02/16/2021 FINDINGS: Right Kidney: Renal measurements: 11.9 x 5.8 x 4.4 cm = volume: 159.2 mL. Cortex is echogenic. No mass or hydronephrosis. Trace perinephric fluid Left Kidney: Renal measurements: 9.7 x 5.2 x 4.9 cm = volume: 129.5 mL. Cortex is echogenic. No mass or hydronephrosis. Bladder: Empty by Foley catheter. Other: None. IMPRESSION: Echogenic kidneys bilaterally consistent with medical renal disease. No hydronephrosis Electronically Signed   By: Adrian Prows.D.  On: 02/16/2021 20:23   MR ABDOMEN MRCP WO CONTRAST  Result Date: 02/28/2021 CLINICAL DATA:  Pancreatitis EXAM: MRI ABDOMEN WITHOUT CONTRAST  (INCLUDING MRCP) TECHNIQUE: Multiplanar multisequence MR imaging of the abdomen was performed. Heavily T2-weighted images of the biliary and pancreatic ducts were obtained, and three-dimensional MRCP images were rendered by post processing. COMPARISON:  CT abdomen 02/26/2021 FINDINGS: Lower chest: Linear subsegmental atelectasis or scarring posteriorly in the right lower lobe. Hepatobiliary: No biliary dilatation. No filling defect in the common bile duct to suggest choledocholithiasis. Gallbladder unremarkable. No significant focal parenchymal lesion is identified on today's noncontrast  examination. Pancreas: Prominent peripancreatic edema compatible with acute pancreatitis with acute fluid collections. Surrounding mesenteric edema tracking along tissue planes, including the retroperitoneum and right perirenal space as well as the right psoas muscle. No obvious walled-off collection to indicate pseudocyst or abscess, sensitivity and specificity mildly adversely affected by the lack of IV contrast. Assessment for pancreatic necrosis indeterminate due to lack of IV contrast. Upper normal caliber of the dorsal pancreatic duct without overt dilatation. Spleen:  Unremarkable Adrenals/Urinary Tract:  Unremarkable Stomach/Bowel: Mild secondary inflammation of the second and third portions of the duodenum related to the pancreatitis. No dilated bowel. Vascular/Lymphatic:  No pathologic adenopathy. Other:  No supplemental non-categorized findings. Musculoskeletal: Low-grade bilateral flank edema. IMPRESSION: 1. Acute pancreatitis with acute peripancreatic fluid collections. No finding suggestive of pseudocyst or abscess. Without IV contrast, assessment for pancreatic necrosis is problematic. Expected edema tracking in the peripancreatic region and also extending along the right retroperitoneum. 2. No biliary dilatation or findings of choledocholithiasis. Electronically Signed   By: Gaylyn Rong M.D.   On: 02/28/2021 14:17   IR Fluoro Guide CV Line Right  Result Date: 02/24/2021 CLINICAL DATA:  Renal failure and need for hemodialysis. The patient currently has a non tunneled temporary dialysis catheter via the right internal jugular vein and requires a tunneled dialysis catheter for longer-term hemodialysis. EXAM: TUNNELED CENTRAL VENOUS HEMODIALYSIS CATHETER PLACEMENT WITH ULTRASOUND AND FLUOROSCOPIC GUIDANCE ANESTHESIA/SEDATION: 1.0 mg IV Versed; 50 mcg IV Fentanyl. Total Moderate Sedation Time:   25 minutes. The patient's level of consciousness and physiologic status were continuously monitored  during the procedure by Radiology nursing. MEDICATIONS: 3 g IV Ancef. FLUOROSCOPY TIME:  24 seconds.  2.5 mGy. PROCEDURE: The procedure, risks, benefits, and alternatives were explained to the patient. Questions regarding the procedure were encouraged and answered. The patient understands and consents to the procedure. A timeout was performed prior to initiating the procedure. The pre-existing non tunneled right jugular temporary dialysis catheter was removed and hemostasis obtained with manual compression over the exit site. Ultrasound was used to confirm patency of the right internal jugular vein after non tunneled catheter removal. The right neck and chest were prepped with chlorhexidine in a sterile fashion, and a sterile drape was applied covering the operative field. Maximum barrier sterile technique with sterile gowns and gloves were used for the procedure. Local anesthesia was provided with 1% lidocaine. After creating a small venotomy incision, a 21 gauge needle was advanced into the right internal jugular vein under direct, real-time ultrasound guidance. Ultrasound image documentation was performed. After securing guidewire access, an 8 Fr dilator was placed. A J-wire was kinked to measure appropriate catheter length. A Palindrome tunneled hemodialysis catheter measuring 19 cm from tip to cuff was chosen for placement. This was tunneled in a retrograde fashion from the chest wall to the venotomy incision. At the venotomy, serial dilatation was performed and a 15 Fr peel-away sheath  was placed over a guidewire. The catheter was then placed through the sheath and the sheath removed. Final catheter positioning was confirmed and documented with a fluoroscopic spot image. The catheter was aspirated, flushed with saline, and injected with appropriate volume heparin dwells. The venotomy incision was closed with subcuticular 4-0 Vicryl. Dermabond was applied to the incision. The catheter exit site was secured with  0-Prolene retention sutures. COMPLICATIONS: None.  No pneumothorax. FINDINGS: After catheter placement, the tip lies in the right atrium. The catheter aspirates normally and is ready for immediate use. IMPRESSION: Placement of tunneled hemodialysis catheter via the right internal jugular vein. The catheter tip lies in the right atrium. The catheter is ready for immediate use. Electronically Signed   By: Irish Lack M.D.   On: 02/24/2021 12:01   IR Fluoro Guide CV Line Right  Result Date: 02/18/2021 INDICATION: 55 year old female referred for temporary hemodialysis catheter placement EXAM: IMAGE GUIDED PLACEMENT OF TEMPORARY HEMODIALYSIS CATHETER MEDICATIONS: None ANESTHESIA/SEDATION: Moderate (conscious) sedation was employed during this procedure. A total of Versed 1.0 mg and Fentanyl 50 mcg was administered intravenously. Moderate Sedation Time: 13 minutes. The patient's level of consciousness and vital signs were monitored continuously by radiology nursing throughout the procedure under my direct supervision. FLUOROSCOPY TIME:  Fluoroscopy Time: 0 minutes 6 seconds (1 mGy). COMPLICATIONS: None PROCEDURE: Informed written consent was obtained from the patient and the patient's family after a thorough discussion of the procedural risks, benefits and alternatives. All questions were addressed. A timeout was performed prior to the initiation of the procedure. The right neck and chest was prepped with chlorhexidine, and draped in the usual sterile fashion using maximum barrier technique (cap and mask, sterile gown, sterile gloves, large sterile sheet, hand hygiene and cutaneous antiseptic). Local anesthesia was attained by infiltration with 1% lidocaine without epinephrine. Ultrasound demonstrated patency of the right internal jugular vein, and this was documented with an image. Under real-time ultrasound guidance, this vein was accessed with a 21 gauge micropuncture needle and image documentation was  performed. A small dermatotomy was made at the access site with an 11 scalpel. A 0.018" wire was advanced into the SVC and the access needle exchanged for a 16F micropuncture vascular sheath. The 0.018" wire was then removed and a 0.035" wire advanced into the IVC. Upon withdrawal of the 018 wire, the wire was marked for appropriate length of the internal portion of the catheter. A 16 cm catheter was selected. Skin and subcutaneous tissues were serially dilated. Catheter was placed on the wire. The catheter tip is positioned in the upper right atrium. This was documented with a spot image. Both ports of the hemodialysis catheter were then tested for excellent function. The ports were then locked with heparinized lock. Patient tolerated the procedure well and remained hemodynamically stable throughout. No complications were encountered and no significant blood loss was encountered. IMPRESSION: Status post image guided placement of temporary hemodialysis catheter. Catheter may be converted if needed. Signed, Yvone Neu. Reyne Dumas, RPVI [facility] Electronically Signed   By: Gilmer Mor D.O.   On: 02/18/2021 14:21   IR US Guide Vasc Access Right  Result Date: 02/18/2021 INDICATION: 55 year old female referred for temporary hemodialysis catheter placement EXAM: IMAGE GUIDED PLACEMENT OF TEMPORARY HEMODIALYSIS CATHETER MEDICATIONS: None ANESTHESIA/SEDATION: Moderate (conscious) sedation was employed during this procedure. A total of Versed 1.0 mg and Fentanyl 50 mcg was administered intravenously. Moderate Sedation Time: 13 minutes. The patient's level of consciousness and vital  signs were monitored continuously by radiology nursing throughout the procedure under my direct supervision. FLUOROSCOPY TIME:  Fluoroscopy Time: 0 minutes 6 seconds (1 mGy). COMPLICATIONS: None PROCEDURE: Informed written consent was obtained from the patient and the patient's family  after a thorough discussion of the procedural risks, benefits and alternatives. All questions were addressed. A timeout was performed prior to the initiation of the procedure. The right neck and chest was prepped with chlorhexidine, and draped in the usual sterile fashion using maximum barrier technique (cap and mask, sterile gown, sterile gloves, large sterile sheet, hand hygiene and cutaneous antiseptic). Local anesthesia was attained by infiltration with 1% lidocaine without epinephrine. Ultrasound demonstrated patency of the right internal jugular vein, and this was documented with an image. Under real-time ultrasound guidance, this vein was accessed with a 21 gauge micropuncture needle and image documentation was performed. A small dermatotomy was made at the access site with an 11 scalpel. A 0.018" wire was advanced into the SVC and the access needle exchanged for a 61F micropuncture vascular sheath. The 0.018" wire was then removed and a 0.035" wire advanced into the IVC. Upon withdrawal of the 018 wire, the wire was marked for appropriate length of the internal portion of the catheter. A 16 cm catheter was selected. Skin and subcutaneous tissues were serially dilated. Catheter was placed on the wire. The catheter tip is positioned in the upper right atrium. This was documented with a spot image. Both ports of the hemodialysis catheter were then tested for excellent function. The ports were then locked with heparinized lock. Patient tolerated the procedure well and remained hemodynamically stable throughout. No complications were encountered and no significant blood loss was encountered. IMPRESSION: Status post image guided placement of temporary hemodialysis catheter. Catheter may be converted if needed. Signed, Yvone Neu. Reyne Dumas, RPVI [facility] Radiology Electronically Signed   By: Gilmer Mor D.O.   On: 02/18/2021 14:21   IR US Guidance  Result  Date: 02/24/2021 CLINICAL DATA:  Renal failure and need for hemodialysis. The patient currently has a non tunneled temporary dialysis catheter via the right internal jugular vein and requires a tunneled dialysis catheter for longer-term hemodialysis. EXAM: TUNNELED CENTRAL VENOUS HEMODIALYSIS CATHETER PLACEMENT WITH ULTRASOUND AND FLUOROSCOPIC GUIDANCE ANESTHESIA/SEDATION: 1.0 mg IV Versed; 50 mcg IV Fentanyl. Total Moderate Sedation Time:   25 minutes. The patient's level of consciousness and physiologic status were continuously monitored during the procedure by Radiology nursing. MEDICATIONS: 3 g IV Ancef. FLUOROSCOPY TIME:  24 seconds.  2.5 mGy. PROCEDURE: The procedure, risks, benefits, and alternatives were explained to the patient. Questions regarding the procedure were encouraged and answered. The patient understands and consents to the procedure. A timeout was performed prior to initiating the procedure. The pre-existing non tunneled right jugular temporary dialysis catheter was removed and hemostasis obtained with manual compression over the exit site. Ultrasound was used to confirm patency of the right internal jugular vein after non tunneled catheter removal. The right neck and chest were prepped with chlorhexidine in a sterile fashion, and a sterile drape was applied covering the operative field. Maximum barrier sterile technique with sterile gowns and gloves were used for the procedure. Local anesthesia was provided with 1% lidocaine. After creating a small venotomy incision, a 21 gauge needle was advanced into the right internal jugular vein under direct, real-time ultrasound guidance. Ultrasound image documentation was performed. After securing guidewire access, an 8 Fr dilator was placed. A J-wire was kinked to measure  appropriate catheter length. A Palindrome tunneled hemodialysis catheter measuring 19 cm from tip to cuff was chosen for placement. This was tunneled in a retrograde fashion from the  chest wall to the venotomy incision. At the venotomy, serial dilatation was performed and a 15 Fr peel-away sheath was placed over a guidewire. The catheter was then placed through the sheath and the sheath removed. Final catheter positioning was confirmed and documented with a fluoroscopic spot image. The catheter was aspirated, flushed with saline, and injected with appropriate volume heparin dwells. The venotomy incision was closed with subcuticular 4-0 Vicryl. Dermabond was applied to the incision. The catheter exit site was secured with 0-Prolene retention sutures. COMPLICATIONS: None.  No pneumothorax. FINDINGS: After catheter placement, the tip lies in the right atrium. The catheter aspirates normally and is ready for immediate use. IMPRESSION: Placement of tunneled hemodialysis catheter via the right internal jugular vein. The catheter tip lies in the right atrium. The catheter is ready for immediate use. Electronically Signed   By: Irish Lack M.D.   On: 02/24/2021 12:01   ECHOCARDIOGRAM COMPLETE  Result Date: 02/17/2021    ECHOCARDIOGRAM REPORT   Patient Name:   IVYROSE HASHMAN Date of Exam: 02/17/2021 Medical Rec #:  [id number]       Height:       65.0 in Accession #:    [id number]      Weight:       240.0 lb Date of Birth:  1966/05/09        BSA:          2.138 m Patient Age:    54 years        BP:           120/60 mmHg Patient Gender: F               HR:           67 bpm. Exam Location:  Inpatient Procedure: 2D Echo Indications:    CHF-Acute Diastolic I50.31  History:        Patient has no prior history of Echocardiogram examinations.  Sonographer:    Thurman Coyer RDCS (AE) Referring Phys: [id number] [place] [facility] IMPRESSIONS  1. Left ventricular ejection fraction, by estimation, is 60%. The left ventricle has normal function. The left ventricle has no regional wall motion abnormalities. There is mild left ventricular hypertrophy. Left ventricular diastolic parameters were low normal.  2. Right  ventricular systolic function is normal. The right ventricular size is normal. Tricuspid regurgitation signal is inadequate for assessing PA pressure.  3. The mitral valve is normal in structure. Trivial mitral valve regurgitation. No evidence of mitral stenosis.  4. The aortic valve is tricuspid. Aortic valve regurgitation is not visualized. No aortic stenosis is present.  5. The inferior vena cava is normal in size with greater than 50% respiratory variability, suggesting right atrial pressure of 3 mmHg. FINDINGS  Left Ventricle: Left ventricular ejection fraction, by estimation, is 60%. The left ventricle has normal function. The left ventricle has no regional wall motion abnormalities. The left ventricular internal cavity size was normal in size. There is mild left ventricular hypertrophy. Left ventricular diastolic parameters were normal. Right Ventricle: The right ventricular size is normal. No increase in right ventricular wall thickness. Right ventricular systolic function is normal. Tricuspid regurgitation signal is inadequate for assessing PA pressure. Left Atrium: Left atrial size was normal in size. Right Atrium: Right atrial size was normal in size. Pericardium: There is no evidence of  pericardial effusion. Mitral Valve: The mitral valve is normal in structure. Trivial mitral valve regurgitation. No evidence of mitral valve stenosis. Tricuspid Valve: The tricuspid valve is normal in structure. Tricuspid valve regurgitation is trivial. No evidence of tricuspid stenosis. Aortic Valve: The aortic valve is tricuspid. Aortic valve regurgitation is not visualized. No aortic stenosis is present. Pulmonic Valve: The pulmonic valve was normal in structure. Pulmonic valve regurgitation is trivial. No evidence of pulmonic stenosis. Aorta: The aortic root is normal in size and structure. Venous: The inferior vena cava is normal in size with greater than 50% respiratory variability, suggesting right atrial pressure  of 3 mmHg. IAS/Shunts: The interatrial septum was not well visualized.  LEFT VENTRICLE PLAX 2D LVIDd:         4.10 cm  Diastology LVIDs:         2.80 cm  LV e' medial:    6.53 cm/s LV PW:         1.20 cm  LV E/e' medial:  11.0 LV IVS:        1.20 cm  LV e' lateral:   9.57 cm/s LVOT diam:     2.10 cm  LV E/e' lateral: 7.5 LV SV:         76 LV SV Index:   36 LVOT Area:     3.46 cm  RIGHT VENTRICLE RV S prime:     15.40 cm/s TAPSE (M-mode): 1.4 cm LEFT ATRIUM             Index       RIGHT ATRIUM          Index LA diam:        3.10 cm 1.45 cm/m  RA Area:     9.08 cm LA Vol (A2C):   27.6 ml 12.91 ml/m RA Volume:   15.80 ml 7.39 ml/m LA Vol (A4C):   39.6 ml 18.53 ml/m LA Biplane Vol: 35.3 ml 16.51 ml/m  AORTIC VALVE LVOT Vmax:   107.00 cm/s LVOT Vmean:  64.800 cm/s LVOT VTI:    0.220 m  AORTA Ao Root diam: 2.80 cm MITRAL VALVE MV Area (PHT): 2.42 cm    SHUNTS MV Decel Time: 313 msec    Systemic VTI:  0.22 m MV E velocity: 71.60 cm/s  Systemic Diam: 2.10 cm MV A velocity: 73.30 cm/s MV E/A ratio:  0.98 Weston Brass MD Electronically signed by Weston Brass MD Signature Date/Time: 02/17/2021/1:13:33 PM    Final    US BIOPSY (KIDNEY)  Result Date: 02/20/2021 INDICATION: 5 year old with acute kidney injury and request for renal biopsy. EXAM: ULTRASOUND-GUIDED RANDOM RENAL BIOPSY MEDICATIONS: Moderate sedation none. ANESTHESIA/SEDATION: Moderate (conscious) sedation was employed during this procedure. A total of Versed 1.0 mg and Fentanyl 50 mcg was administered intravenously. Moderate Sedation Time: 15 minutes. The patient's level of consciousness and vital signs were monitored continuously by radiology nursing throughout the procedure under my direct supervision. FLUOROSCOPY TIME:  None COMPLICATIONS: None immediate. PROCEDURE: Informed written consent was obtained from the patient after a thorough discussion of the procedural risks, benefits and alternatives. All questions were addressed. A timeout was  performed prior to the initiation of the procedure. Patient was placed prone. Both kidneys were evaluated with ultrasound. The right kidney was targeted for biopsy. The right flank was prepped with chlorhexidine and sterile field was created. Maximal barrier sterile technique was utilized including caps, mask, sterile gowns, sterile gloves, sterile drape, hand hygiene and skin antiseptic. Skin was anesthetized using 1% lidocaine.  A small incision was made. Using ultrasound guidance, 16 gauge core biopsy was directed into the right kidney lower pole. Adequate specimen was obtained and placed in saline. A second ultrasound-guided core biopsy was obtained from the right kidney lower pole. Adequate specimen was obtained and placed in saline. Bandage placed over the puncture site. FINDINGS: Negative for hydronephrosis. Two core biopsies obtained from the right kidney lower pole. No significant bleeding or hematoma formation. IMPRESSION: Successful ultrasound-guided random core biopsies from the right kidney lower pole. Electronically Signed   By: Richarda Overlie M.D.   On: 02/20/2021 17:20   US Abdomen Limited RUQ (LIVER/GB)  Result Date: 02/20/2021 CLINICAL DATA:  Transaminitis EXAM: ULTRASOUND ABDOMEN LIMITED RIGHT UPPER QUADRANT COMPARISON:  CT abdomen and pelvis 02/16/2021 FINDINGS: Gallbladder: Normally distended. 6 mm nonshadowing intraluminal echogenic focus likely small polyp; no follow-up imaging recommended. No gallbladder wall thickening, pericholecystic fluid or sonographic Murphy sign. No discrete shadowing calculi. Common bile duct: Diameter: 2 mm Liver: Slightly heterogeneous echogenicity, nonspecific. No discrete hepatic mass or nodularity. No intrahepatic biliary dilatation. Portal vein is patent on color Doppler imaging with normal direction of blood flow towards the liver. Other: No RIGHT upper quadrant free fluid. IMPRESSION: 6 mm gallbladder polyp; no follow-up imaging recommended. Nonspecific mildly  heterogeneous hepatic parenchymal echogenicity without mass or biliary dilatation. Electronically Signed   By: Ulyses Southward M.D.   On: 02/20/2021 18:13     TODAY-DAY OF DISCHARGE:  Subjective:   [name] today has no headache,no chest abdominal pain,no new weakness tingling or numbness, feels much better wants to go home today.   Objective:   Blood pressure 139/84, pulse 82, temperature 98.3 F (36.8 C), temperature source Axillary, resp. rate 18, height 5\' 5"  (1.651 m), weight 132.4 kg, SpO2 98 %.  Intake/Output Summary (Last 24 hours) at 03/01/2021 1140 Last data filed at 02/28/2021 1649 Gross per 24 hour  Intake --  Output 0 ml  Net 0 ml   Filed Weights   02/25/21 1643 02/28/21 1342 02/28/21 1649  Weight: 129.5 kg 132.4 kg 132.4 kg    Exam: Awake Alert, Oriented *3, No new F.N deficits, Normal affect [place].AT,PERRAL Supple Neck,No JVD, No cervical lymphadenopathy appriciated.  Symmetrical Chest wall movement, Good air movement bilaterally, CTAB RRR,No Gallops,Rubs or new Murmurs, No Parasternal Heave +ve B.Sounds, Abd Soft, Non tender, No organomegaly appriciated, No rebound -guarding or rigidity. No Cyanosis, Clubbing or edema, No new Rash or bruise   PERTINENT RADIOLOGIC STUDIES: MR ABDOMEN MRCP WO CONTRAST  Result Date: 02/28/2021 CLINICAL DATA:  Pancreatitis EXAM: MRI ABDOMEN WITHOUT CONTRAST  (INCLUDING MRCP) TECHNIQUE: Multiplanar multisequence MR imaging of the abdomen was performed. Heavily T2-weighted images of the biliary and pancreatic ducts were obtained, and three-dimensional MRCP images were rendered by post processing. COMPARISON:  CT abdomen 02/26/2021 FINDINGS: Lower chest: Linear subsegmental atelectasis or scarring posteriorly in the right lower lobe. Hepatobiliary: No biliary dilatation. No filling defect in the common bile duct to suggest choledocholithiasis. Gallbladder unremarkable. No significant focal parenchymal lesion is identified on today's  noncontrast examination. Pancreas: Prominent peripancreatic edema compatible with acute pancreatitis with acute fluid collections. Surrounding mesenteric edema tracking along tissue planes, including the retroperitoneum and right perirenal space as well as the right psoas muscle. No obvious walled-off collection to indicate pseudocyst or abscess, sensitivity and specificity mildly adversely affected by the lack of IV contrast. Assessment for pancreatic necrosis indeterminate due to lack of IV contrast. Upper normal caliber of the dorsal pancreatic duct without overt dilatation.  Spleen:  Unremarkable Adrenals/Urinary Tract:  Unremarkable Stomach/Bowel: Mild secondary inflammation of the second and third portions of the duodenum related to the pancreatitis. No dilated bowel. Vascular/Lymphatic:  No pathologic adenopathy. Other:  No supplemental non-categorized findings. Musculoskeletal: Low-grade bilateral flank edema. IMPRESSION: 1. Acute pancreatitis with acute peripancreatic fluid collections. No finding suggestive of pseudocyst or abscess. Without IV contrast, assessment for pancreatic necrosis is problematic. Expected edema tracking in the peripancreatic region and also extending along the right retroperitoneum. 2. No biliary dilatation or findings of choledocholithiasis. Electronically Signed   By: Gaylyn Rong M.D.   On: 02/28/2021 14:17     PERTINENT LAB RESULTS: CBC: Recent Labs    02/28/21 0553 03/01/21 0213  WBC 11.2* 10.6*  HGB 8.7* 8.4*  HCT 27.6* 25.6*  PLT 130* 105*   CMET CMP     Component Value Date/Time   NA 136 03/01/2021 0213   K 3.8 03/01/2021 0213   CL 101 03/01/2021 0213   CO2 29 03/01/2021 0213   GLUCOSE 135 (H) 03/01/2021 0213   BUN 35 (H) 03/01/2021 0213   CREATININE 2.89 (H) 03/01/2021 0213   CALCIUM 7.8 (L) 03/01/2021 0213   PROT 5.5 (L) 02/28/2021 0553   ALBUMIN 2.0 (L) 03/01/2021 0213   AST 41 02/28/2021 0553   ALT 21 02/28/2021 0553   ALKPHOS 67  02/28/2021 0553   BILITOT 0.4 02/28/2021 0553   GFRNONAA 19 (L) 03/01/2021 0213   GFRAA >60 06/27/2016 1915    GFR Estimated Creatinine Clearance: 31 mL/min (A) (by C-G formula based on SCr of 2.89 mg/dL (H)). Recent Labs    02/26/21 2013 02/27/21 0420  LIPASE 178* 165*   No results for input(s): CKTOTAL, CKMB, CKMBINDEX, TROPONINI in the last 72 hours. Invalid input(s): POCBNP No results for input(s): DDIMER in the last 72 hours. No results for input(s): HGBA1C in the last 72 hours. Recent Labs    02/27/21 0420  CHOL 270*  HDL 42  LDLCALC 187*  TRIG 203*  CHOLHDL 6.4   No results for input(s): TSH, T4TOTAL, T3FREE, THYROIDAB in the last 72 hours.  Invalid input(s): FREET3 No results for input(s): VITAMINB12, FOLATE, FERRITIN, TIBC, IRON, RETICCTPCT in the last 72 hours. Coags: No results for input(s): INR in the last 72 hours.  Invalid input(s): PT Microbiology: No results found for this or any previous visit (from the past 240 hour(s)).  FURTHER DISCHARGE INSTRUCTIONS:  Get Medicines reviewed and adjusted: Please take all your medications with you for your next visit with your Primary MD  Laboratory/radiological data: Please request your Primary MD to go over all hospital tests and procedure/radiological results at the follow up, please ask your Primary MD to get all Hospital records sent to his/her office.  In some cases, they will be blood work, cultures and biopsy results pending at the time of your discharge. Please request that your primary care M.D. goes through all the records of your hospital data and follows up on these results.  Also Note the following: If you experience worsening of your admission symptoms, develop shortness of breath, life threatening emergency, suicidal or homicidal thoughts you must seek medical attention immediately by calling 911 or calling your MD immediately  if symptoms less severe.  You must read complete  instructions/literature along with all the possible adverse reactions/side effects for all the Medicines you take and that have been prescribed to you. Take any new Medicines after you have completely understood and accpet all the possible adverse reactions/side effects.  Do not drive when taking Pain medications or sleeping medications (Benzodaizepines)  Do not take more than prescribed Pain, Sleep and Anxiety Medications. It is not advisable to combine anxiety,sleep and pain medications without talking with your primary care practitioner  Special Instructions: If you have smoked or chewed Tobacco  in the last 2 yrs please stop smoking, stop any regular Alcohol  and or any Recreational drug use.  Wear Seat belts while driving.  Please note: You were cared for by a hospitalist during your hospital stay. Once you are discharged, your primary care physician will handle any further medical issues. Please note that NO REFILLS for any discharge medications will be authorized once you are discharged, as it is imperative that you return to your primary care physician (or establish a relationship with a primary care physician if you do not have one) for your post hospital discharge needs so that they can reassess your need for medications and monitor your lab values.  Total Time spent coordinating discharge including counseling, education and face to face time equals 45 minutes.  Signed: Aviah Sorci 03/01/2021 11:40 AM

## 2021-03-01 NOTE — Progress Notes (Signed)
Occupational Therapy Treatment Patient Details Name: Denise Watson MRN: [id number] DOB: [birth year] Today's Date: 03/01/2021    History of present illness Pt is a 55 y.o. female admitted 02/16/21 with fatigue, SOB. 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 S/p RIJ temp HD cath placement 3/19. Plan for renal biopsy 3/21. PMH includes HTN, obesity, psoriasis, COVID-19 (12/2020).   OT comments  Pt received supine in bed, agreeable to OT services. PT's sister was in the room for this treatment. Pt reported not feeling well and having pain in her L side, however she was able to participate in therapy. Pt presented with a flat/uninterested affect this session. She was educated on energy conservation and safety in her home for functional mobility, ADL's and IADL's. Additionally, pt was instructed on exercises she can do while in bed or sitting in the recliner to continue progressing with her strength and mobility. Pt will continue to benefit from acute OT to address concerns listed below, as well as home health OT to continue progressing Pt's mobility and activity tolerance once discharged.   Follow Up Recommendations  Home health OT    Equipment Recommendations  3 in 1 bedside commode    Recommendations for Other Services      Precautions / Restrictions Precautions Precautions: Fall Precaution Comments: Quick to fatigue (improving) Restrictions Weight Bearing Restrictions: No       Mobility Bed Mobility Overal bed mobility: Independent             General bed mobility comments: Supine to EOB without external assist. Completes task in reasonable amount of time.    Transfers Overall transfer level: Modified independent Equipment used: None Transfers: Sit to/from Stand Sit to Stand: Modified independent (Device/Increase time)         General transfer comment: Pt was able to complete transfers w/ additional time due to pain/fatigue.    Balance Overall balance  assessment: Modified Independent Sitting-balance support: Feet supported;No upper extremity supported Sitting balance-Leahy Scale: Normal     Standing balance support: No upper extremity supported Standing balance-Leahy Scale: Good                             ADL either performed or assessed with clinical judgement   ADL Overall ADL's : Needs assistance/impaired Eating/Feeding: Independent;Sitting   Grooming: Supervision/safety;Sitting;Wash/dry face Grooming Details (indicate cue type and reason): Patient declines handwashing standing at sink level                 Toilet Transfer: Radiographer, therapeutic Details (indicate cue type and reason): Pt ambulated w/out RW Toileting- Clothing Manipulation and Hygiene: Supervision/safety;Sit to/from stand       Functional mobility during ADLs: Supervision/safety General ADL Comments: Pt reported not feeling well and not wanting to do much, however she did participate in therapy.     Vision   Vision Assessment?: No apparent visual deficits   Perception     Praxis      Cognition Arousal/Alertness: Awake/alert Behavior During Therapy: WFL for tasks assessed/performed;Flat affect Overall Cognitive Status: Within Functional Limits for tasks assessed                                 General Comments: Pt willing to participate in therapy, however reported that she did not feel like doing anything, she could take care of all self hygiene later. Pt appeared very flat/uninterested throughout  the session.        Exercises Exercises: General Upper Extremity General Exercises - Upper Extremity Shoulder Flexion: AROM;Both;5 reps Elbow Flexion: AROM;Both;5 reps Other Exercises Other Exercises: Hand squeezes, AROM, Both, 5 reps.   Shoulder Instructions       General Comments Sister present and supportive. Pt reported not feeling well and having 6/10 pain in left side. Pt agreeable to education  on energy conservation in the home, as well as safety with functional mobility.    Pertinent Vitals/ Pain       Pain Assessment: 0-10 Pain Score: 6  Faces Pain Scale: Hurts a little bit Pain Location: L side of mid abdomen Pain Descriptors / Indicators: Discomfort Pain Intervention(s): Repositioned;Monitored during session  Home Living                                          Prior Functioning/Environment              Frequency  Min 2X/week        Progress Toward Goals  OT Goals(current goals can now be found in the care plan section)  Progress towards OT goals: Progressing toward goals  Acute Rehab OT Goals Patient Stated Goal: To return home. OT Goal Formulation: With patient Time For Goal Achievement: 03/05/21 Potential to Achieve Goals: Good ADL Goals Pt Will Perform Grooming: with supervision;standing Pt Will Perform Lower Body Bathing: with supervision;sit to/from stand;with adaptive equipment Pt Will Perform Lower Body Dressing: with supervision;with adaptive equipment;sit to/from stand Pt Will Transfer to Toilet: with supervision;ambulating;bedside commode Pt Will Perform Toileting - Clothing Manipulation and hygiene: with supervision;sit to/from stand Additional ADL Goal #1: Pt will state at least 3 energy conservation strategies as instructed.  Plan Discharge plan remains appropriate;Frequency remains appropriate    Co-evaluation                 AM-PAC OT "6 Clicks" Daily Activity     Outcome Measure   Help from another person eating meals?: None Help from another person taking care of personal grooming?: A Little Help from another person toileting, which includes using toliet, bedpan, or urinal?: A Little Help from another person bathing (including washing, rinsing, drying)?: A Little Help from another person to put on and taking off regular upper body clothing?: None Help from another person to put on and taking off regular  lower body clothing?: A Little 6 Click Score: 20    End of Session    OT Visit Diagnosis: Unsteadiness on feet (R26.81);Muscle weakness (generalized) (M62.81)   Activity Tolerance Patient limited by fatigue;Patient limited by pain   Patient Left in chair;with call bell/phone within reach;with family/visitor present   Nurse Communication          Time: 3220-2542 OT Time Calculation (min): 13 min  Charges: OT General Charges $OT Visit: 1 Visit OT Treatments $Self Care/Home Management : 8-22 mins  Azora Bonzo H., OTR/L Acute Rehabilitation  Chynna Buerkle Elane Jeroline Wolbert 03/01/2021, 12:07 PM

## 2021-03-03 LAB — SURGICAL PATHOLOGY

## 2021-03-14 ENCOUNTER — Encounter (HOSPITAL_COMMUNITY): Payer: Self-pay

## 2021-07-25 IMAGING — CT CT HEAD W/O CM
4 series · 16 of 47 positions shown, 18 images · non-contrast
Comparison: 01/27/2021

CLINICAL DATA: Delirium

EXAM:
CT HEAD WITHOUT CONTRAST
TECHNIQUE: Contiguous axial images were obtained from the base of the skull
through the vertex without intravenous contrast.

[Series 3: head without · axial · non-contrast · 0.45mm/px · z∈[-64,+56]mm · 7 of 33 slices shown, 9 images]
[im 5/33  brain]
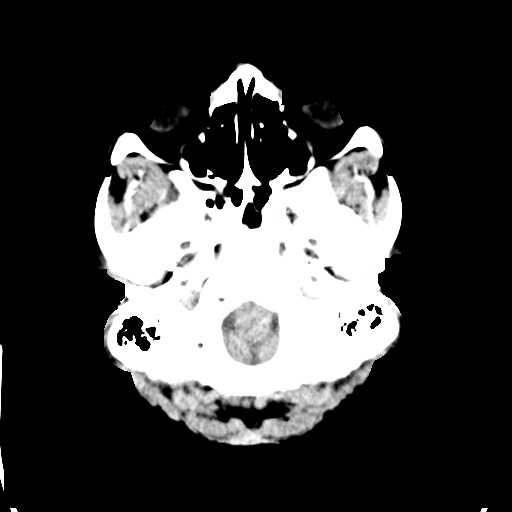
[im 5/33  bone]
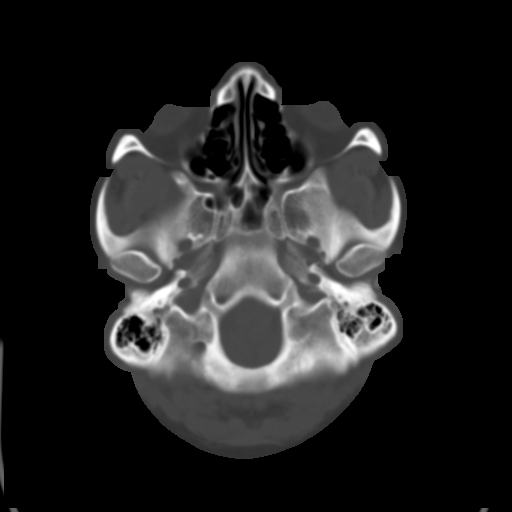
[im 9/33  brain]
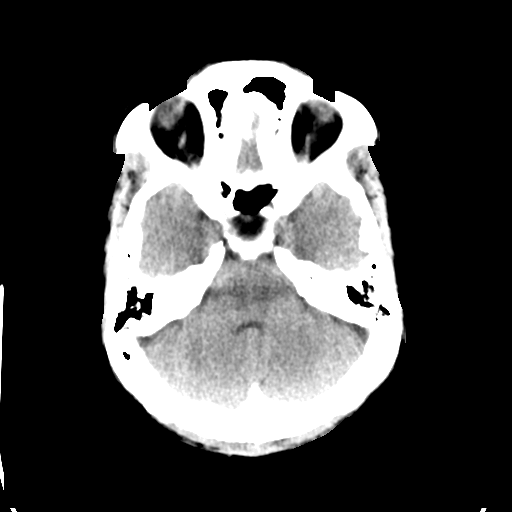
[im 13/33  brain]
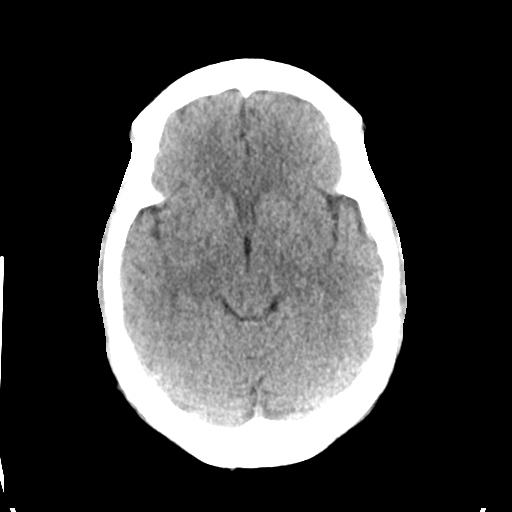
[im 17/33  brain]
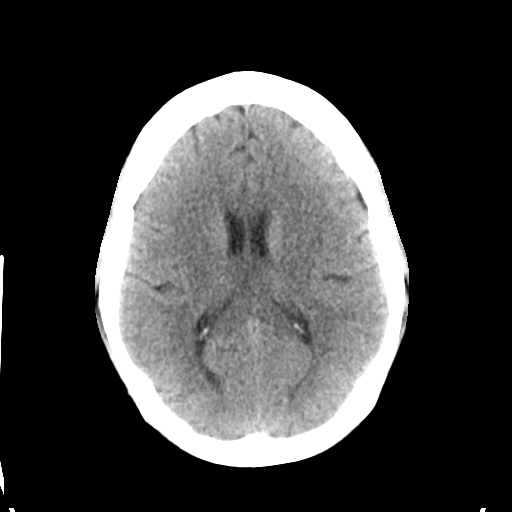
[im 21/33  brain]
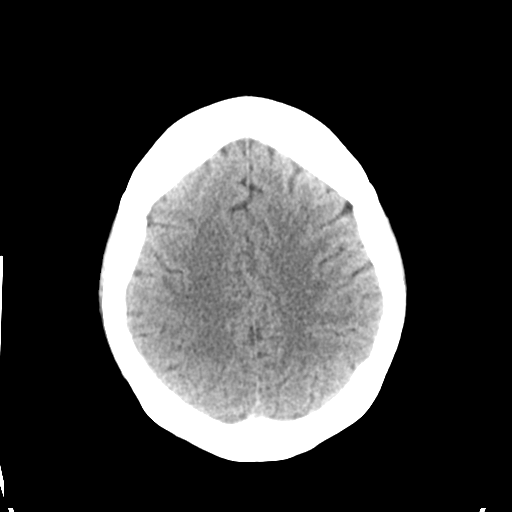
[im 21/33  bone]
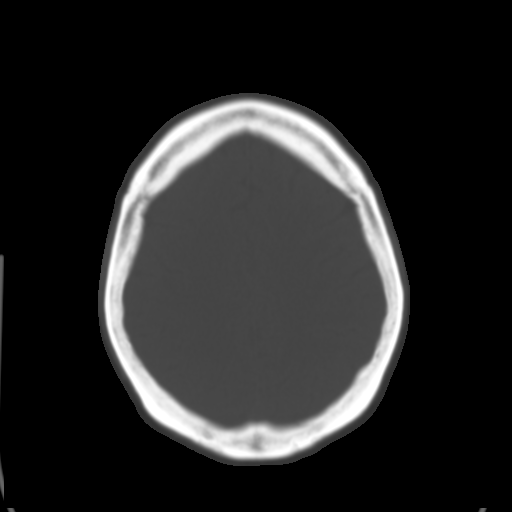
[im 25/33  brain]
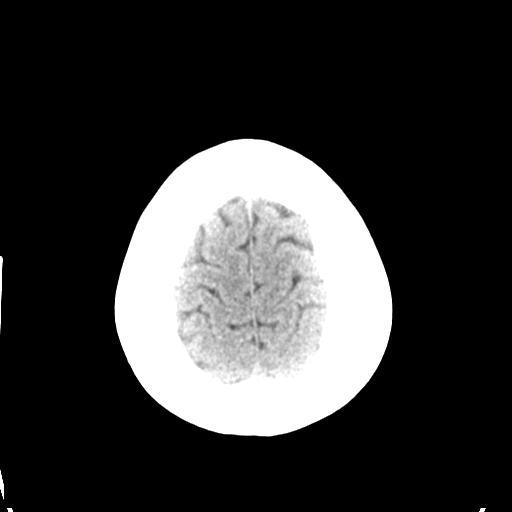
[im 29/33  brain]
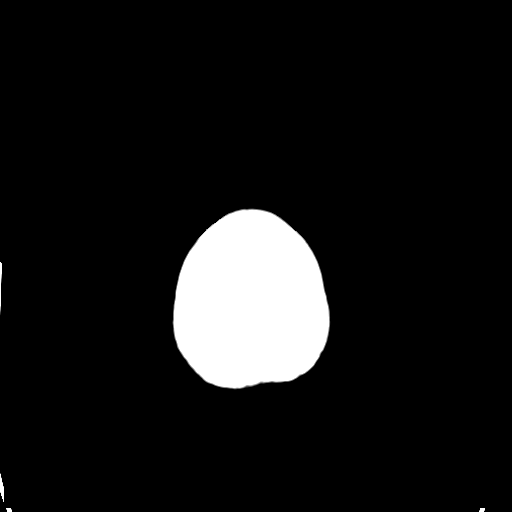

[Series 4: head bone · axial · 0.45mm/px · z∈[-68,-36]mm · 3 of 82 slices shown]
[im 9/82  bone]
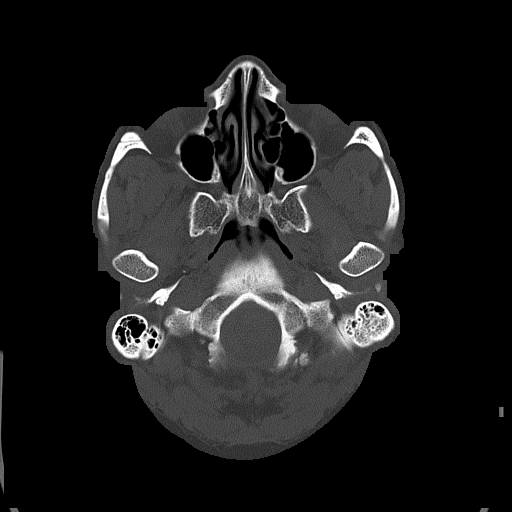
[im 17/82  bone]
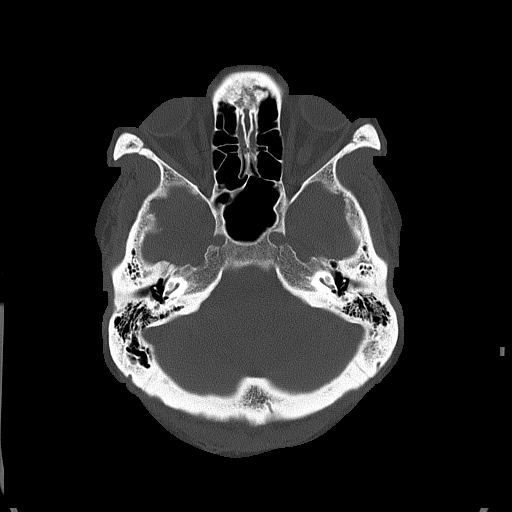
[im 25/82  bone]
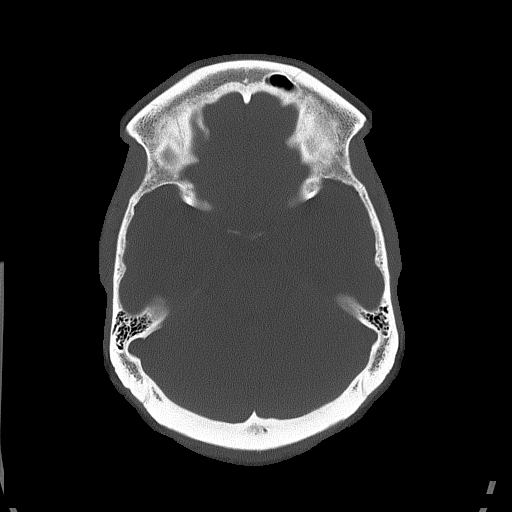

[Series 5: head without cor · coronal · non-contrast · 0.32mm/px · 3 of 67 slices shown]
[im 23/67  brain]
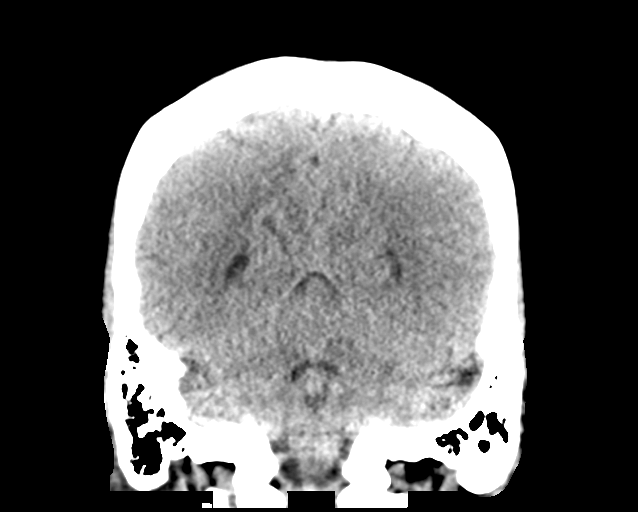
[im 30/67  brain]
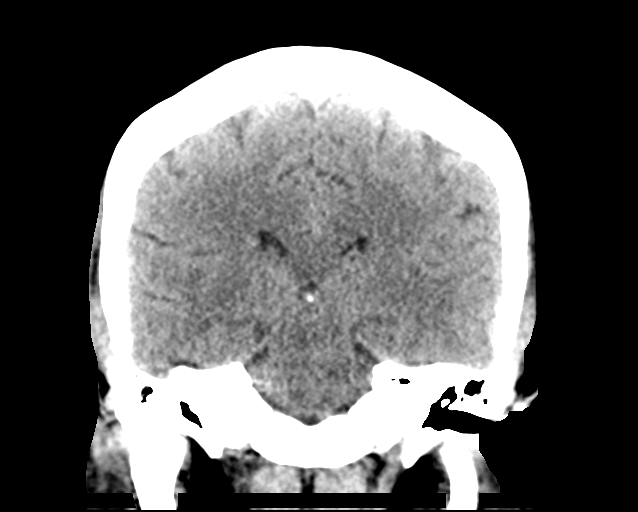
[im 37/67  brain]
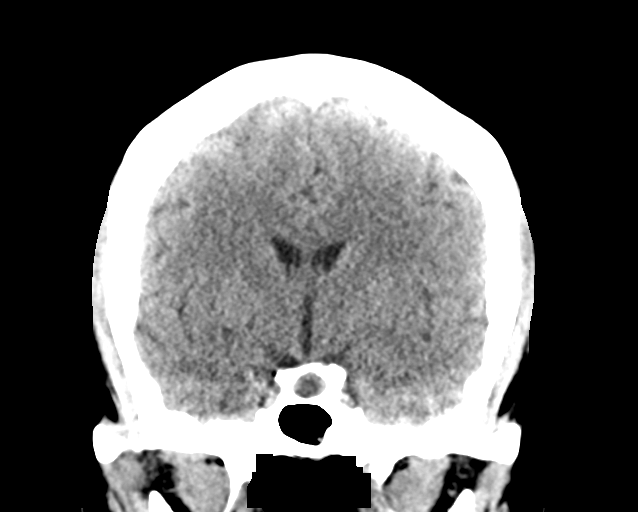

[Series 6: head without sag · sagittal · non-contrast · 0.32mm/px · 3 of 66 slices shown]
[im 22/66  brain]
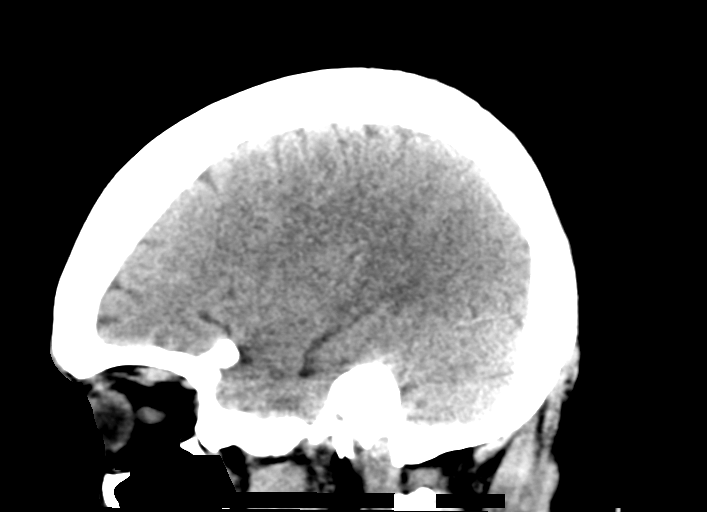
[im 33/66  brain]
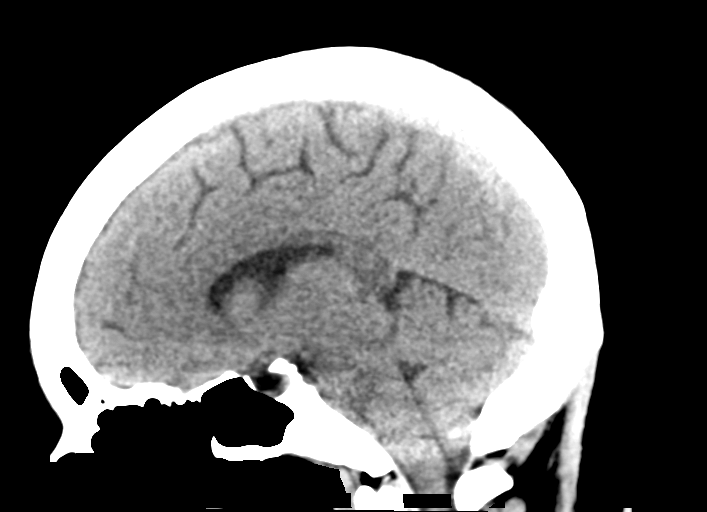
[im 44/66  brain]
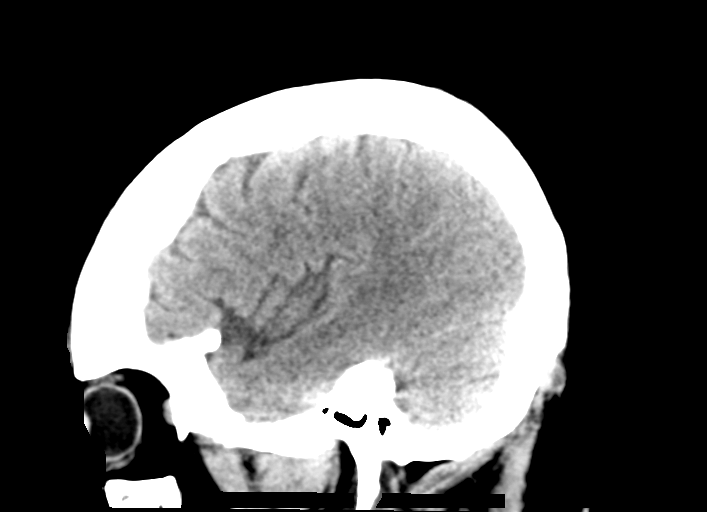

[16 of 47 positions shown; findings below may reference images not displayed]

FINDINGS: Brain: There is no acute intracranial hemorrhage, mass effect, or
edema. Gray-white differentiation is preserved. There is no
extra-axial fluid collection. Ventricles and sulci are within normal
limits in size and configuration.

Vascular: No hyperdense vessel or unexpected calcification.

Skull: Calvarium is unremarkable.

Sinuses/Orbits: No acute finding.

Other: Partially empty sella.
IMPRESSION: No acute intracranial abnormality.

## 2021-07-25 IMAGING — XA IR FLUORO GUIDE CV LINE*R*
1 series · 1 of 1 positions shown · non-contrast
Comparison: none

CLINICAL DATA: Renal failure and need for hemodialysis. The patient
currently has a non tunneled temporary dialysis catheter via the
right internal jugular vein and requires a tunneled dialysis
catheter for longer-term hemodialysis.

[Series 1: fl (-) angio · 1 of 1 slices shown]
[im 1/1]
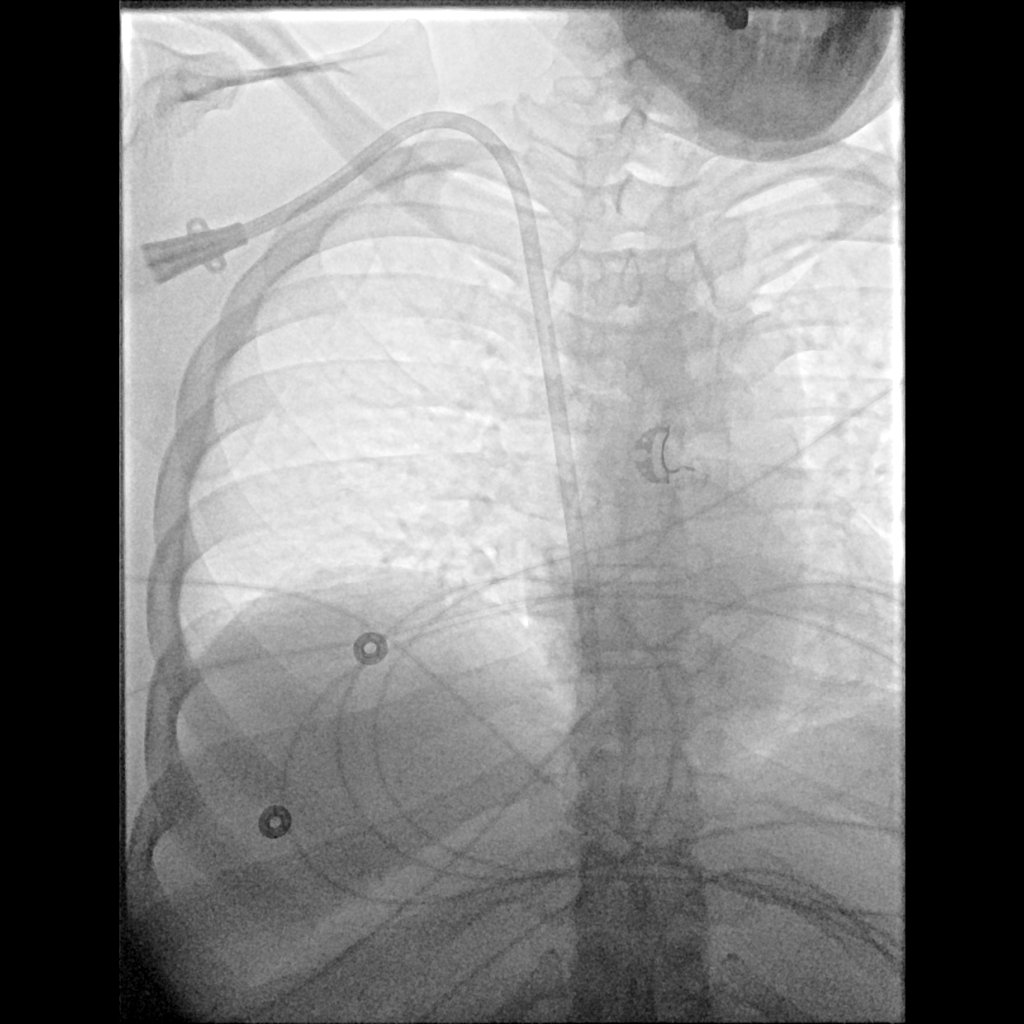

[1 of 1 positions shown; findings below may reference images not displayed]

EXAM:
TUNNELED CENTRAL VENOUS HEMODIALYSIS CATHETER PLACEMENT WITH
ULTRASOUND AND FLUOROSCOPIC GUIDANCE

ANESTHESIA/SEDATION:
1.0 mg IV Versed; 50 mcg IV Fentanyl.

Total Moderate Sedation Time:   25 minutes.

The patient's level of consciousness and physiologic status were
continuously monitored during the procedure by Radiology nursing.

MEDICATIONS:
3 g IV Ancef.

FLUOROSCOPY TIME:  24 seconds.  2.5 mGy.

PROCEDURE:
The procedure, risks, benefits, and alternatives were explained to
the patient. Questions regarding the procedure were encouraged and
answered. The patient understands and consents to the procedure. A
timeout was performed prior to initiating the procedure.

The pre-existing non tunneled right jugular temporary dialysis
catheter was removed and hemostasis obtained with manual compression
over the exit site. Ultrasound was used to confirm patency of the
right internal jugular vein after non tunneled catheter removal. The
right neck and chest were prepped with chlorhexidine in a sterile
fashion, and a sterile drape was applied covering the operative
field. Maximum barrier sterile technique with sterile gowns and
gloves were used for the procedure. Local anesthesia was provided
with 1% lidocaine.

After creating a small venotomy incision, a 21 gauge needle was
advanced into the right internal jugular vein under direct,
real-time ultrasound guidance. Ultrasound image documentation was
performed. After securing guidewire access, an 8 Fr dilator was
placed. A J-wire was kinked to measure appropriate catheter length.

A Palindrome tunneled hemodialysis catheter measuring 19 cm from tip
to cuff was chosen for placement. This was tunneled in a retrograde
fashion from the chest wall to the venotomy incision.

At the venotomy, serial dilatation was performed and a 15 Fr
peel-away sheath was placed over a guidewire. The catheter was then
placed through the sheath and the sheath removed. Final catheter
positioning was confirmed and documented with a fluoroscopic spot
image. The catheter was aspirated, flushed with saline, and injected
with appropriate volume heparin dwells.

The venotomy incision was closed with subcuticular 4-0 Vicryl.
Dermabond was applied to the incision. The catheter exit site was
secured with 0-Prolene retention sutures.

COMPLICATIONS:
None.  No pneumothorax.
FINDINGS: After catheter placement, the tip lies in the right atrium. The
catheter aspirates normally and is ready for immediate use.
IMPRESSION: Placement of tunneled hemodialysis catheter via the right internal
jugular vein. The catheter tip lies in the right atrium. The
catheter is ready for immediate use.

## 2021-09-05 ENCOUNTER — Ambulatory Visit (INDEPENDENT_AMBULATORY_CARE_PROVIDER_SITE_OTHER): Payer: BC Managed Care – PPO | Admitting: Psychologist

## 2021-09-05 ENCOUNTER — Other Ambulatory Visit: Payer: Self-pay

## 2021-09-05 DIAGNOSIS — F320 Major depressive disorder, single episode, mild: Secondary | ICD-10-CM | POA: Diagnosis not present

## 2021-11-14 ENCOUNTER — Ambulatory Visit: Payer: BC Managed Care – PPO | Admitting: Psychologist

## 2023-04-08 ENCOUNTER — Other Ambulatory Visit: Payer: Self-pay

## 2023-04-08 ENCOUNTER — Emergency Department (HOSPITAL_COMMUNITY)
Admission: EM | Admit: 2023-04-08 | Discharge: 2023-04-09 | Disposition: A | Discharge status: Home / Self Care | Payer: Managed Care, Other (non HMO) | Attending: Emergency Medicine | Admitting: Emergency Medicine

## 2023-04-08 ENCOUNTER — Emergency Department (HOSPITAL_COMMUNITY): Payer: Managed Care, Other (non HMO)

## 2023-04-08 DIAGNOSIS — H5310 Unspecified subjective visual disturbances: Secondary | ICD-10-CM | POA: Diagnosis not present

## 2023-04-08 DIAGNOSIS — Z1152 Encounter for screening for COVID-19: Secondary | ICD-10-CM | POA: Insufficient documentation

## 2023-04-08 DIAGNOSIS — H532 Diplopia: Secondary | ICD-10-CM

## 2023-04-08 DIAGNOSIS — R519 Headache, unspecified: Secondary | ICD-10-CM

## 2023-04-08 HISTORY — DX: Systemic lupus erythematosus, unspecified: M32.9

## 2023-04-08 HISTORY — DX: Essential (primary) hypertension: I10

## 2023-04-08 HISTORY — DX: Unspecified osteoarthritis, unspecified site: M19.90

## 2023-04-08 HISTORY — DX: Reserved for concepts with insufficient information to code with codable children: IMO0002

## 2023-04-08 HISTORY — DX: Chronic kidney disease, unspecified: N18.9

## 2023-04-08 LAB — CBC
HCT: 40.1 % (ref 36.0–46.0)
Hemoglobin: 12.7 g/dL (ref 12.0–15.0)
MCH: 28.5 pg (ref 26.0–34.0)
MCHC: 31.7 g/dL (ref 30.0–36.0)
MCV: 90.1 fL (ref 80.0–100.0)
Platelets: 265 10*3/uL (ref 150–400)
RBC: 4.45 MIL/uL (ref 3.87–5.11)
RDW: 13.8 % (ref 11.5–15.5)
WBC: 12.1 10*3/uL — ABNORMAL HIGH (ref 4.0–10.5)
nRBC: 0 % (ref 0.0–0.2)

## 2023-04-08 LAB — BASIC METABOLIC PANEL
Anion gap: 14 (ref 5–15)
BUN: 15 mg/dL (ref 6–20)
CO2: 26 mmol/L (ref 22–32)
Calcium: 9.8 mg/dL (ref 8.9–10.3)
Chloride: 96 mmol/L — ABNORMAL LOW (ref 98–111)
Creatinine, Ser: 1.42 mg/dL — ABNORMAL HIGH (ref 0.44–1.00)
GFR, Estimated: 43 mL/min — ABNORMAL LOW (ref >60)
Glucose, Bld: 146 mg/dL — ABNORMAL HIGH (ref 70–99)
Potassium: 4 mmol/L (ref 3.5–5.1)
Sodium: 136 mmol/L (ref 135–145)

## 2023-04-08 LAB — I-STAT BETA HCG BLOOD, ED (MC, WL, AP ONLY): I-stat hCG, quantitative: <5 m[IU]/mL (ref <5)

## 2023-04-08 LAB — CBG MONITORING, ED: Glucose-Capillary: 139 mg/dL — ABNORMAL HIGH (ref 70–99)

## 2023-04-08 NOTE — ED Triage Notes (Signed)
Pt arrives with c/o headache that started Saturday night. Pt endorses blurry vision, fatigue, nausea and SOB. Pt has hx of lupus.

## 2023-04-09 ENCOUNTER — Emergency Department (HOSPITAL_COMMUNITY): Payer: Managed Care, Other (non HMO)

## 2023-04-09 ENCOUNTER — Emergency Department (EMERGENCY_DEPARTMENT_HOSPITAL): Payer: Managed Care, Other (non HMO)

## 2023-04-09 ENCOUNTER — Encounter (HOSPITAL_COMMUNITY): Payer: Self-pay

## 2023-04-09 DIAGNOSIS — R519 Headache, unspecified: Secondary | ICD-10-CM

## 2023-04-09 DIAGNOSIS — H5310 Unspecified subjective visual disturbances: Secondary | ICD-10-CM

## 2023-04-09 DIAGNOSIS — H532 Diplopia: Secondary | ICD-10-CM

## 2023-04-09 LAB — URINALYSIS, ROUTINE W REFLEX MICROSCOPIC
Bacteria, UA: NONE SEEN
Bilirubin Urine: NEGATIVE
Glucose, UA: NEGATIVE mg/dL
Hgb urine dipstick: NEGATIVE
Ketones, ur: NEGATIVE mg/dL
Nitrite: NEGATIVE
Protein, ur: NEGATIVE mg/dL
Specific Gravity, Urine: 1.031 — ABNORMAL HIGH (ref 1.005–1.030)
pH: 5 (ref 5.0–8.0)

## 2023-04-09 LAB — SEDIMENTATION RATE: Sed Rate: 50 mm/hr — ABNORMAL HIGH (ref 0–22)

## 2023-04-09 LAB — C-REACTIVE PROTEIN: CRP: <0.5 mg/dL (ref <1.0)

## 2023-04-09 LAB — SARS CORONAVIRUS 2 BY RT PCR: SARS Coronavirus 2 by RT PCR: NEGATIVE

## 2023-04-09 MED ORDER — METOCLOPRAMIDE HCL 5 MG/ML IJ SOLN
10.0000 mg | Freq: Once | INTRAMUSCULAR | Status: AC
  Administered 2023-04-09: 10 mg via INTRAVENOUS
  Filled 2023-04-09: qty 2

## 2023-04-09 MED ORDER — GADOBUTROL 1 MMOL/ML IV SOLN
10.0000 mL | Freq: Once | INTRAVENOUS | Status: AC | PRN
  Administered 2023-04-09: 10 mL via INTRAVENOUS

## 2023-04-09 MED ORDER — GADOBUTROL 1 MMOL/ML IV SOLN: 13.5000 mL | Freq: Once | INTRAVENOUS | Status: DC | PRN

## 2023-04-09 MED ORDER — SODIUM CHLORIDE 0.9 % IV BOLUS
1000.0000 mL | Freq: Once | INTRAVENOUS | Status: AC
  Administered 2023-04-09: 1000 mL via INTRAVENOUS

## 2023-04-09 MED ORDER — PROCHLORPERAZINE MALEATE 5 MG PO TABS
10.0000 mg | ORAL_TABLET | Freq: Once | ORAL | Status: AC
  Administered 2023-04-09: 10 mg via ORAL
  Filled 2023-04-09: qty 2

## 2023-04-09 MED ORDER — DIPHENHYDRAMINE HCL 50 MG/ML IJ SOLN
12.5000 mg | Freq: Once | INTRAMUSCULAR | Status: AC
  Administered 2023-04-09: 12.5 mg via INTRAVENOUS
  Filled 2023-04-09: qty 1

## 2023-04-09 MED ORDER — IOHEXOL 350 MG/ML SOLN
75.0000 mL | Freq: Once | INTRAVENOUS | Status: AC | PRN
  Administered 2023-04-09: 75 mL via INTRAVENOUS

## 2023-04-09 MED ORDER — VALPROATE SODIUM 100 MG/ML IV SOLN
1000.0000 mg | Freq: Once | INTRAVENOUS | Status: AC
  Administered 2023-04-09: 1000 mg via INTRAVENOUS
  Filled 2023-04-09: qty 10

## 2023-04-09 NOTE — Consult Note (Signed)
NEUROLOGY CONSULTATION NOTE   Date of service: Apr 09, 2023 Patient Name: Nataiya Czarnowski MRN:  [id number] DOB:  07-Nov-1966 Reason for consult: Headache, Diplopia  _ _ _   _ __   _ __ _ _  __ __   _ __   __ _  History of Present Illness  Aniylah Laramie is a 57 y.o. female living with a history of SLE on MMF and hydroxychloroquine, remote history of migraine, lupus nephritis, CKD stage IIIb who presented to the ED after acute diplopia with associated nausea.  5 days ago she had an episode of nosebleed that resolved on its own.  The following day she had a headache.  The headache started in her neck and moved superiorly and anteriorly towards her forehead.  She describes the headache as constant.  Nothing at home made the pain better.  She is unsure if her cramps improve the pain as she has not been in 1 since the pain started.  She describes a history of similar pain many years ago that would resolve with Tylenol.  At 5 PM yesterday evening she began having vision changes that she describes as double vision.  She began having associated nausea.  She has had double vision before when she had severe kidney failure but none since then.  She denies any numbness or tingling anywhere in her body.  She denies any trouble swallowing.  Denies any pain when moving her eyeball or pain behind her eye.  While in the emergency department she endorses improvement of her headache with Reglan, Compazine, Benadryl   ROS   Constitutional Denies weight loss, fever and chills.   HEENT Diplopia  Respiratory Denies SOB and cough.   CV Denies palpitations and CP   GI Endorses nausea  GU N/A  Neurological Endorses headache, denies syncope   Past History   Past Medical History:  Diagnosis Date   Chronic kidney disease    Hypertension    Lupus (HCC)    Osteoarthritis    Swelling of both lower extremities    Past Surgical History:  Procedure Laterality Date   CESAREAN SECTION     IR FLUORO GUIDE CV LINE RIGHT   02/18/2021   IR FLUORO GUIDE CV LINE RIGHT  02/24/2021   IR US GUIDANCE  02/24/2021   IR US GUIDE VASC ACCESS RIGHT  02/18/2021   Family History  Problem Relation Age of Onset   Kidney disease Father    Social History   Socioeconomic History   Marital status: Married    Spouse name: Not on file   Number of children: Not on file   Years of education: Not on file   Highest education level: Not on file  Occupational History   Not on file  Tobacco Use   Smoking status: Never   Smokeless tobacco: Never  Substance and Sexual Activity   Alcohol use: Yes    Comment: occ   Drug use: No   Sexual activity: Not on file  Other Topics Concern   Not on file  Social History Narrative   Not on file   Social Determinants of Health   Financial Resource Strain: Not on file  Food Insecurity: Not on file  Transportation Needs: Not on file  Physical Activity: Not on file  Stress: Not on file  Social Connections: Not on file   No Known Allergies  Medications  MMF 500 mg 2 tablets twice daily Protonix 40 mg daily Hydroxychloroquine 200 mg twice daily Gabapentin  300 mg nightly Losartan 50 mg daily  Vitals   Vitals:   04/09/23 0525 04/09/23 0600 04/09/23 0730 04/09/23 1015  BP: 109/65 (!) 108/52 (!) 92/44 (!) 108/46  Pulse: 75 75 65 70  Resp: 18  17 18   Temp: 98.5 F (36.9 C)  98.4 F (36.9 C) 97.6 F (36.4 C)  TempSrc: Oral  Oral Oral  SpO2: 97% 95% 100% 99%  Weight:         Body mass index is 49.09 kg/m.  Physical Exam   General: No acute distress, well-appearing HENT: Normal oropharynx and mucosa. Neck: Supple, no pain or tenderness  CV: RRR. No peripheral edema.  Pulmonary: Symmetric Chest rise. Normal respiratory effort.  Abdomen: Soft to touch, non-tender.  Ext: No cyanosis, edema, or deformity  Skin: No rash. Normal palpation of skin.   Musculoskeletal: Normal digits and nails by inspection.  Neurologic Examination  Mental status/Cognition: Alert and  oriented to person place and time Speech/language: Fluent, comprehension intact Cranial nerves:   CN II Pupils equal and reactive to light, no VF deficits   CN III,IV,VI EOM intact, no gaze preference or deviation, no nystagmus.  Diplopia worsens with rightward gaze.  Diplopia resolves with covering of left and right eye.   CN V normal sensation in V1, V2, and V3 segments bilaterally   CN VII no asymmetry, no nasolabial fold flattening    CN VIII normal hearing to speech    CN IX & X normal palatal elevation, no uvular deviation    CN XI 5/5 head turn and 5/5 shoulder shrug bilaterally    CN XII midline tongue protrusion    Motor:  Muscle bulk: Normal, tone normal, pronator drift absent tremor absent Mvmt Root Nerve  Muscle Right Left Comments  SA C5/6 Ax Deltoid 5/5 5/5   EF C5/6 Mc Biceps 5/5 5/5   EE C6/7/8 Rad Triceps 5/5 5/5   HF L1/2/3 Fem Illopsoas 5/5 5/5   DF L4/5 D Peron Tib Ant 5/5 5/5   PF S1/2 Tibial Grc/Sol 5/5 5/5    Sensation: Sensation consistent throughout  Coordination/Complex Motor:  - Finger to Nose Normal - Rapid alternating movement normal  Labs   CBC:  Recent Labs  Lab 04/08/23 2217  WBC 12.1*  HGB 12.7  HCT 40.1  MCV 90.1  PLT 265    Basic Metabolic Panel:  Lab Results  Component Value Date   NA 136 04/08/2023   K 4.0 04/08/2023   CO2 26 04/08/2023   GLUCOSE 146 (H) 04/08/2023   BUN 15 04/08/2023   CREATININE 1.42 (H) 04/08/2023   CALCIUM 9.8 04/08/2023   GFRNONAA 43 (L) 04/08/2023   GFRAA >60 06/27/2016   Lipid Panel:  Lab Results  Component Value Date   LDLCALC 187 (H) 02/27/2021   HgbA1c:  Lab Results  Component Value Date   HGBA1C 6.3 (H) 02/16/2021   ESR - 50 CRP - <.5  CT Head without contrast: No acute intracranial pathology  CT angio head and neck and venogram  Pending  MRI Brain  No acute findings, 2mm nodular enhancement fundus of right internal auditory canal   Impression   Ms. 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  Her history, physical exam and improvement of her headache with migraine cocktail appear consistent with migraine headache.  She does have worsening diplopia with looking to the right which may indicate a partial cranial nerve VI palsy.  It is reassuring that she has had similar symptoms in the past that were consistent with migraine.  Her ESR is mildly elevated at 15, I have low suspicion for giant cell arteritis again with her having similar symptoms in the past. We have alternative explanation for her elevated ESR with her hx of autoimmune disease and while not completely sensitive her temporal artery [place] being negative is reassuring. Would not recommend temporal artery biopsy.   Imaging findings consist with vestibular schwannoma, do not believe that these are contributing to her symptoms.  Otherwise imaging without evidence of ischemic etiology.  Recommended CT angio head and neck as well as venogram to rule out dissection.  If normal can discharge home with follow-up with primary care as well as ophthalmology with return precautions.  Will give dose of Depakote to see if has improvement in her migraine.  Recommendations  -CT angiogram as well as venogram of head and neck.  If negative, can discharge home with follow-up with ophthalmology and PCP for migraine headache w/ diplopia  -See if improvement of headache w/ Depakote  ______________________________________________________________________   Thank you for the opportunity to take part in the care of this patient. If you have any further questions, please contact the neurology consultation attending.  Signed,  Thalia Bloodgood DO  Internal Medicine Resident PGY-3 [facility]  Pager: [phone]

## 2023-04-09 NOTE — Discharge Instructions (Addendum)
It was a pleasure taking care of you today.  As discussed, your imaging was reassuring.  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  I have placed a referral to neurology.  Expect a phone call within 1 week to schedule an appointment.  Please follow-up with your eye doctor for further evaluation of your double vision.  Return to the ER for new or worsening symptoms.

## 2023-04-09 NOTE — ED Provider Notes (Signed)
Care assumed from Berle Mull, PA-C at shift change pending MRI brain and ultrasound.  See his note for full HPI.  In short, patient is a 57 year old female who presents to the ED due to right-sided headache associated diplopia of right eye.  Patient states headache started 2 days ago.  Headache preceded by chills and malaise.  No history of headaches.  Denies speech changes and unilateral weakness.  Admits to having a nosebleed 4 days ago. Diplopia intermittent.  Physical Exam  BP 109/65   Pulse 75   Temp 98.5 F (36.9 C) (Oral)   Resp 18   Wt 133.8 kg   SpO2 97%   BMI 49.09 kg/m   Physical Exam Vitals and nursing note reviewed.  Constitutional:      General: She is not in acute distress.    Appearance: She is not ill-appearing.  HENT:     Head: Normocephalic.  Eyes:     Pupils: Pupils are equal, round, and reactive to light.  Cardiovascular:     Rate and Rhythm: Normal rate and regular rhythm.     Pulses: Normal pulses.     Heart sounds: Normal heart sounds. No murmur heard.    No friction rub. No gallop.  Pulmonary:     Effort: Pulmonary effort is normal.     Breath sounds: Normal breath sounds.  Abdominal:     General: Abdomen is flat. There is no distension.     Palpations: Abdomen is soft.     Tenderness: There is no abdominal tenderness. There is no guarding or rebound.  Musculoskeletal:        General: Normal range of motion.     Cervical back: Neck supple.  Skin:    General: Skin is warm and dry.  Neurological:     General: No focal deficit present.     Mental Status: She is alert.  Psychiatric:        Mood and Affect: Mood normal.        Behavior: Behavior normal.     Procedures  Procedures  ED Course / MDM    Medical Decision Making Amount and/or Complexity of Data Reviewed Independent Historian: spouse Labs: ordered. Decision-making details documented in ED Course. Radiology: ordered and independent interpretation performed. Decision-making  details documented in ED Course.  Risk Prescription drug management.   Care assumed from Berle Mull, PA-C at shift change pending MRI brain and ultrasound.  See his note for full MDM.  57 year old female presents to the ED due to right-sided headache and diplopia.  Previous provider concerned about possible giant cell arteritis. History of Lupus. Previous provider spoke to neurology who recommended MRI brain and ultrasound of temporal artery.  Given migraine cocktail by previous provider.  Denies speech changes.  No unilateral weakness.  No history of CVA.  I personally reviewed all labs and imaging ordered by previous provider.  CBC significant for mild leukocytosis at 12.1.  BMP significant for elevated creatinine 1.42 which appears to be better than patient's baseline.  Pregnancy test negative.  ESR slightly elevated at 50.  CRP normal.  Awaiting MRI brain and temporal artery ultrasound at shift change.  7:41 AM reassessed patient at bedside.  Patient admits to improvement in right-sided headache.  Rates her pain a 4/10.  Patient states pain was preceded with chills and malaise.  Denies cough and sore throat.  COVID test added.  MRI brain personally reviewed and interpreted which demonstrates possible vestibular schwannoma.  Temporal artery [place] negative. Low  suspicion for giant cell.   Discussed with Dr. Amada Jupiter with neurology who recommends CTA head/neck and CT venogram.  He also recommends 1 g Depakote.  Discussed CT results with Dr. Amada Jupiter who notes patient is stable for discharge with neurology follow-up.   12:48 PM reassessed patient at bedside.  Patient admits to significant improvement in headache after Depakote.  Patient ready for discharge.  Ambulatory referral placed for neurology.  CTA head/neck and CT venogram negative for any acute abnormalities except for asymmetric fullness of anterior belly of the right digastric muscle of unknown etiology. Unknown etiology of  headache and diplopia.  Advised patient to follow-up with her ophthalmologist for further evaluation. Strict ED precautions discussed with patient. Patient states understanding and agrees to plan. Patient discharged home in no acute distress and stable vitals  Has PCP    Jesusita Oka 04/09/23 1253    Tilden Fossa, MD 04/09/23 2256

## 2023-04-09 NOTE — Progress Notes (Signed)
Temporal artery duplex study completed.   Please see CV Proc for preliminary results.   Louiza Moor, RDMS, RVT  

## 2023-04-09 NOTE — ED Provider Notes (Signed)
[facility] AT [facility] Provider Note   CSN: [id number] Arrival date & time: 04/08/23  2130     History  Chief Complaint  Patient presents with   Headache    Denise Watson is a 57 y.o. female.  HPI   Medical history including SLE immunosuppressed, presenting with complaints of headache.  States it started on Saturday, states has been constant, mainly on the right side of her head, states it is all over the right side of her head, she admits to visual changes mainly in the right eye, states it started yesterday around 5 PM, states she was seeing double, states that this is since resolved prior to arrival to the hospital, she states that she does have some slight blurred vision bilaterally worse on the right versus the left, she does wear glasses but even with glasses she still has visual changes.  She states that she does have bilateral paresthesias in her hands and feet, states that she is currently on gabapentin for neuropathy.  No recent falls, not on anticoag's, has not had a headache like this before.  She does admit to being slightly nauseous as well.    Home Medications Prior to Admission medications   Medication Sig Start Date End Date Taking? Authorizing Provider  ascorbic acid (VITAMIN C) 500 MG tablet Take 500 mg by mouth daily.    [provider]  Cholecalciferol 25 MCG (1000 UT) tablet Take 1,000 Units by mouth daily.    [provider]  mycophenolate (CELLCEPT) 250 MG capsule Take 1 capsule (250 mg total) by mouth 2 (two) times daily. 03/01/21   Ghimire, Werner Lean, MD  pantoprazole (PROTONIX) 40 MG tablet Take 1 tablet (40 mg total) by mouth daily. 03/01/21   Ghimire, Werner Lean, MD  pantoprazole (PROTONIX) 40 MG tablet TAKE 1 TABLET (40 MG TOTAL) BY MOUTH DAILY. 03/01/21 03/01/22  Ghimire, Werner Lean, MD  predniSONE (DELTASONE) 20 MG tablet Take 3 tablets (60 mg) p.o. daily, on 4/5 start taking 2 tablets (40 mg) p.o. daily-and  stay on until seen by nephrology. 03/01/21   Ghimire, Werner Lean, MD  vitamin B-12 (CYANOCOBALAMIN) 500 MCG tablet Take 500 mcg by mouth daily.    [provider]      Allergies    Patient has no known allergies.    Review of Systems   Review of Systems  Constitutional:  Negative for chills and fever.  Eyes:  Positive for visual disturbance.  Respiratory:  Negative for shortness of breath.   Cardiovascular:  Negative for chest pain.  Gastrointestinal:  Negative for abdominal pain.  Neurological:  Positive for headaches.    Physical Exam Updated Vital Signs BP 109/65   Pulse 75   Temp 98.5 F (36.9 C) (Oral)   Resp 18   Wt 133.8 kg   SpO2 97%   BMI 49.09 kg/m  Physical Exam Vitals and nursing note reviewed.  Constitutional:      General: She is not in acute distress.    Appearance: She is not ill-appearing.  HENT:     Head: Normocephalic and atraumatic.     Nose: No congestion.  Eyes:     Extraocular Movements: Extraocular movements intact.     Conjunctiva/sclera: Conjunctivae normal.     Pupils: Pupils are equal, round, and reactive to light.  Cardiovascular:     Rate and Rhythm: Normal rate and regular rhythm.     Pulses: Normal pulses.     Heart sounds:  No murmur heard.    No friction rub. No gallop.  Pulmonary:     Effort: No respiratory distress.     Breath sounds: No wheezing, rhonchi or rales.  Skin:    General: Skin is warm and dry.  Neurological:     Mental Status: She is alert.     GCS: GCS eye subscore is 4. GCS verbal subscore is 5. GCS motor subscore is 6.     Motor: Motor function is intact. No weakness.     Coordination: Romberg sign negative. Finger-Nose-Finger Test normal.     Comments: Cranial nerves II through XII grossly intact, no difficulty with word finding, following two-step commands, no unilateral weakness present.  Psychiatric:        Mood and Affect: Mood normal.     ED Results / Procedures / Treatments   Labs (all labs  ordered are listed, but only abnormal results are displayed) Labs Reviewed  BASIC METABOLIC PANEL - Abnormal; Notable for the following components:      Result Value   Chloride 96 (*)    Glucose, Bld 146 (*)    Creatinine, Ser 1.42 (*)    GFR, Estimated 43 (*)    All other components within normal limits  CBC - Abnormal; Notable for the following components:   WBC 12.1 (*)    All other components within normal limits  SEDIMENTATION RATE - Abnormal; Notable for the following components:   Sed Rate 50 (*)    All other components within normal limits  CBG MONITORING, ED - Abnormal; Notable for the following components:   Glucose-Capillary 139 (*)    All other components within normal limits  C-REACTIVE PROTEIN  URINALYSIS, ROUTINE W REFLEX MICROSCOPIC  I-STAT BETA HCG BLOOD, ED ([facility], [facility], [facility] ONLY)    EKG None  Radiology CT HEAD WO CONTRAST  Result Date: 04/08/2023 CLINICAL DATA:  Blurred vision with fatigue, nausea and shortness of breath. EXAM: CT HEAD WITHOUT CONTRAST TECHNIQUE: Contiguous axial images were obtained from the base of the skull through the vertex without intravenous contrast. RADIATION DOSE REDUCTION: This exam was performed according to the departmental dose-optimization program which includes automated exposure control, adjustment of the mA and/or kV according to patient size and/or use of iterative reconstruction technique. COMPARISON:  February 24, 2021 FINDINGS: Brain: No evidence of acute infarction, hemorrhage, hydrocephalus, extra-axial collection or mass lesion/mass effect. Vascular: No hyperdense vessel or unexpected calcification. Skull: Normal. Negative for fracture or focal lesion. Sinuses/Orbits: No acute finding. Other: None. IMPRESSION: No acute intracranial pathology. Electronically Signed   By: Aram Candela M.D.   On: 04/08/2023 23:51    Procedures Procedures    Medications Ordered in ED Medications  gadobutrol (GADAVIST) 1 MMOL/ML injection 10 mL  (has no administration in time range)  prochlorperazine (COMPAZINE) tablet 10 mg (10 mg Oral Given 04/09/23 0310)  sodium chloride 0.9 % bolus 1,000 mL (0 mLs Intravenous Stopped 04/09/23 0455)  diphenhydrAMINE (BENADRYL) injection 12.5 mg (12.5 mg Intravenous Given 04/09/23 0317)  metoCLOPramide (REGLAN) injection 10 mg (10 mg Intravenous Given 04/09/23 0524)    ED Course/ Medical Decision Making/ A&P                             Medical Decision Making Amount and/or Complexity of Data Reviewed Labs: ordered. Radiology: ordered.  Risk Prescription drug management.   This patient presents to the ED for concern of headache, this involves an extensive number  of treatment options, and is a complaint that carries with it a high risk of complications and morbidity.  The differential diagnosis includes giant cell arthritis, CVA, intracranial bleed, dissection, migraine    Additional history obtained:  Additional history obtained from N/A External records from outside source obtained and reviewed including physical therapy notes, pulmonology notes   Co morbidities that complicate the patient evaluation  SLE, immunosuppressed  Social Determinants of Health:  N/A    Lab Tests:  I Ordered, and personally interpreted labs.  The pertinent results include: CBC shows leukocytosis of 12.1, BMP reveals chloride 96, glucose 146, creatinine 1.42, GFR 43, CBG 139,   Imaging Studies ordered:  I ordered imaging studies including CT head I independently visualized and interpreted imaging which showed negative I agree with the radiologist interpretation   Cardiac Monitoring:  The patient was maintained on a cardiac monitor.  I personally viewed and interpreted the cardiac monitored which showed an underlying rhythm of: n/a   Medicines ordered and prescription drug management:  I ordered medication including migraine cocktail I have reviewed the patients home medicines and have made  adjustments as needed  Critical Interventions:  N/a   Reevaluation:  Presents with headache, triage obtain lab work imaging which I personally reviewed, unremarkable, my exam there is no focal deficits, she had no point tenderness along her right temporal lobe, suspect likely a migraine will provide a migraine cocktail and reassess  Reassessed the patient, states that headache has improved as the double vision again her right eye, due to her history of lupus I am concerned for possible giant cell arthritis, will consult with neurology for further recommendations.    Consultations Obtained:  I requested consultation with [facility] neurology,  and discussed lab and imaging findings as well as pertinent plan - they recommend: Recommends MRI with and without contrast brain as well as talking on C reactant as well as sed rate, if either of the inflammatory markers are elevated would recommend temporal lobe ultrasound for further evaluation.    Test Considered:  N/a    Rule out low suspicion for internal head bleed and or mass as CT imaging is negative for acute findings.  Low suspicion for CVA she has no focal deficit present my exam.  Low suspicion for dissection of the vertebral or carotid artery as presentation atypical of etiology.  Low suspicion for meningitis as she has no meningeal sign present.       Dispostion and problem list  Due to shift change patient handoff to Claudette Stapler   Follow-up on MRI as well as ultrasound and discharge accordingly.             Final Clinical Impression(s) / ED Diagnoses Final diagnoses:  Bad headache    Rx / DC Orders ED Discharge Orders     None         Carroll Sage, PA-C 04/09/23 1610    Tilden Fossa, MD 04/09/23 [phone]

## 2023-04-09 NOTE — ED Notes (Signed)
Patient transported to MRI
